# Patient Record
Sex: Male | Born: 1955 | Race: White | Hispanic: No | Marital: Married | Smoking: Never smoker
Health system: Southern US, Community
[De-identification: ages and names within clinical notes are randomized; demographics above are authoritative.]

## PROBLEM LIST (undated history)

## (undated) DIAGNOSIS — K219 Gastro-esophageal reflux disease without esophagitis: Secondary | ICD-10-CM

## (undated) DIAGNOSIS — I10 Essential (primary) hypertension: Secondary | ICD-10-CM

## (undated) DIAGNOSIS — I2510 Atherosclerotic heart disease of native coronary artery without angina pectoris: Secondary | ICD-10-CM

## (undated) DIAGNOSIS — Z86010 Personal history of colonic polyps: Secondary | ICD-10-CM

## (undated) DIAGNOSIS — E785 Hyperlipidemia, unspecified: Secondary | ICD-10-CM

## (undated) HISTORY — PX: TONSILLECTOMY: SUR1361

## (undated) HISTORY — DX: Personal history of colonic polyps: Z86.010

## (undated) HISTORY — DX: Hyperlipidemia, unspecified: E78.5

## (undated) HISTORY — DX: Atherosclerotic heart disease of native coronary artery without angina pectoris: I25.10

## (undated) HISTORY — DX: Essential (primary) hypertension: I10

## (undated) HISTORY — DX: Gastro-esophageal reflux disease without esophagitis: K21.9

## (undated) HISTORY — PX: PROSTATE BIOPSY: SHX241

---
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
## 1998-05-14 HISTORY — PX: LUMBAR DISC SURGERY: SHX700

## 1999-01-15 ENCOUNTER — Emergency Department (HOSPITAL_COMMUNITY): Admission: EM | Admit: 1999-01-15 | Discharge: 1999-01-15 | Payer: Self-pay | Admitting: *Deleted

## 1999-02-06 ENCOUNTER — Ambulatory Visit (HOSPITAL_COMMUNITY): Admission: RE | Admit: 1999-02-06 | Discharge: 1999-02-07 | Payer: Self-pay | Admitting: Neurosurgery

## 2004-06-30 ENCOUNTER — Ambulatory Visit: Payer: Self-pay

## 2005-08-03 ENCOUNTER — Ambulatory Visit: Payer: Self-pay

## 2008-05-14 HISTORY — PX: CORONARY ANGIOPLASTY WITH STENT PLACEMENT: SHX49

## 2008-07-29 ENCOUNTER — Encounter: Payer: Self-pay | Admitting: Cardiovascular Disease

## 2008-10-21 ENCOUNTER — Encounter: Payer: Self-pay | Admitting: Cardiovascular Disease

## 2008-10-21 ENCOUNTER — Inpatient Hospital Stay (HOSPITAL_COMMUNITY): Admission: AD | Admit: 2008-10-21 | Discharge: 2008-10-22 | Payer: Self-pay | Admitting: Cardiology

## 2008-11-09 ENCOUNTER — Encounter: Payer: Self-pay | Admitting: Cardiovascular Disease

## 2008-11-09 LAB — CONVERTED CEMR LAB
ALT: 24 units/L
AST: 24 units/L
Albumin: 4.3 g/dL
Alkaline Phosphatase: 69 units/L
BUN: 21 mg/dL
CO2: 30 meq/L
Calcium: 9.6 mg/dL
Chloride: 101 meq/L
Cholesterol: 134 mg/dL
Creatinine, Ser: 1.2 mg/dL
GFR calc Af Amer: 82 mL/min
GFR calc non Af Amer: 67 mL/min
Glucose, Bld: 89 mg/dL
HDL: 35 mg/dL
LDL Cholesterol: 73 mg/dL
Potassium: 4.9 meq/L
Sodium: 139 meq/L
Total Bilirubin: 0.4 mg/dL
Total Protein: 6.7 g/dL
Triglyceride fasting, serum: 101 mg/dL

## 2009-03-22 ENCOUNTER — Encounter: Payer: Self-pay | Admitting: Cardiovascular Disease

## 2009-04-13 ENCOUNTER — Encounter: Payer: Self-pay | Admitting: Cardiovascular Disease

## 2009-04-14 ENCOUNTER — Encounter: Payer: Self-pay | Admitting: Cardiovascular Disease

## 2009-04-15 ENCOUNTER — Encounter: Payer: Self-pay | Admitting: Cardiovascular Disease

## 2009-05-14 ENCOUNTER — Encounter: Payer: Self-pay | Admitting: Cardiovascular Disease

## 2009-07-21 ENCOUNTER — Encounter: Payer: Self-pay | Admitting: Cardiovascular Disease

## 2009-07-29 ENCOUNTER — Ambulatory Visit: Payer: Self-pay | Admitting: Cardiovascular Disease

## 2009-07-29 DIAGNOSIS — I2510 Atherosclerotic heart disease of native coronary artery without angina pectoris: Secondary | ICD-10-CM | POA: Insufficient documentation

## 2009-07-29 DIAGNOSIS — I10 Essential (primary) hypertension: Secondary | ICD-10-CM | POA: Insufficient documentation

## 2009-07-29 DIAGNOSIS — E782 Mixed hyperlipidemia: Secondary | ICD-10-CM | POA: Insufficient documentation

## 2009-10-14 ENCOUNTER — Encounter: Payer: Self-pay | Admitting: Cardiovascular Disease

## 2010-01-02 IMAGING — CR DG CHEST 2V
2 series · 2 of 2 positions shown · non-contrast
Comparison: None

CLINICAL DATA: Chest pain, post catheterization

CHEST - 2 VIEW

[w chest pa]
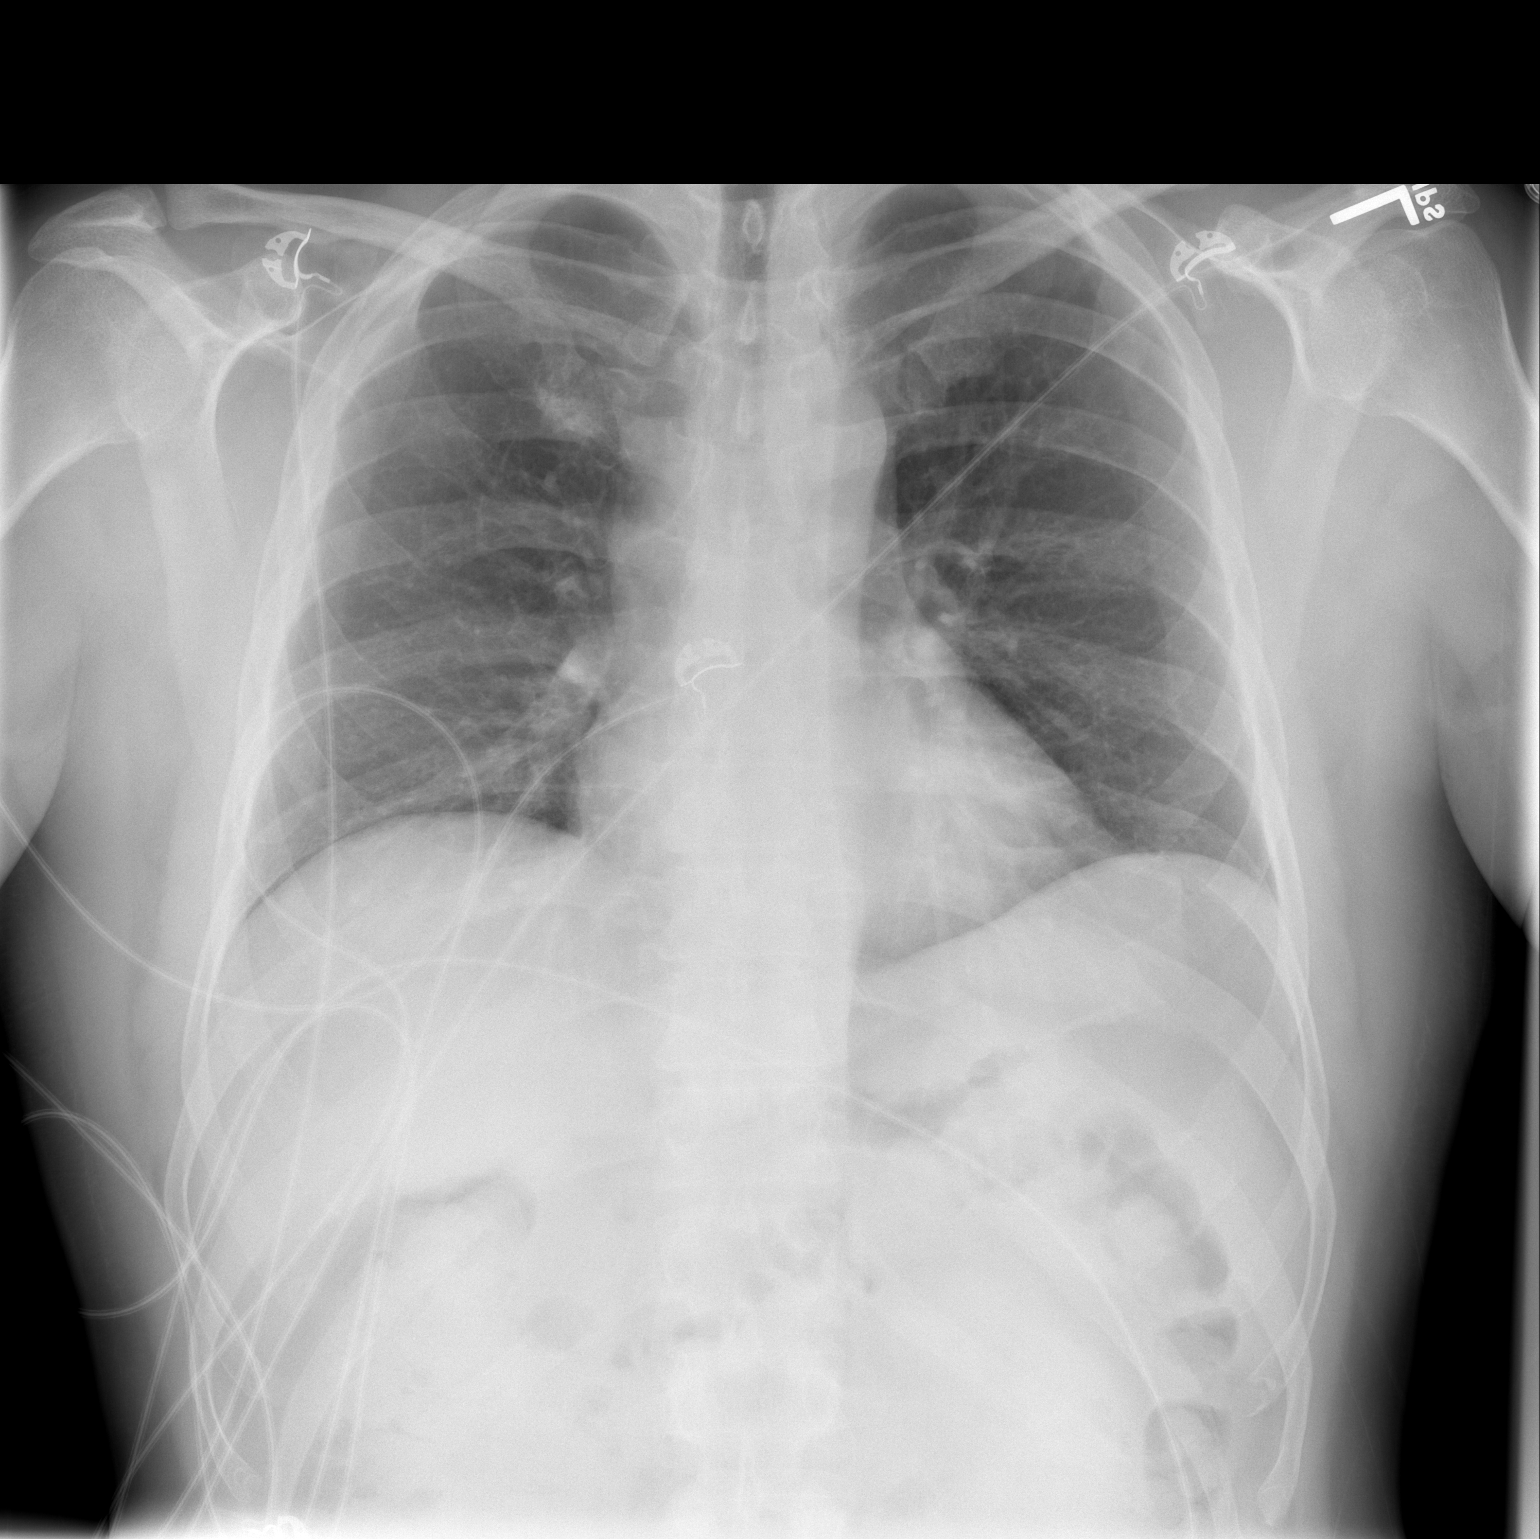

[w chest lat]
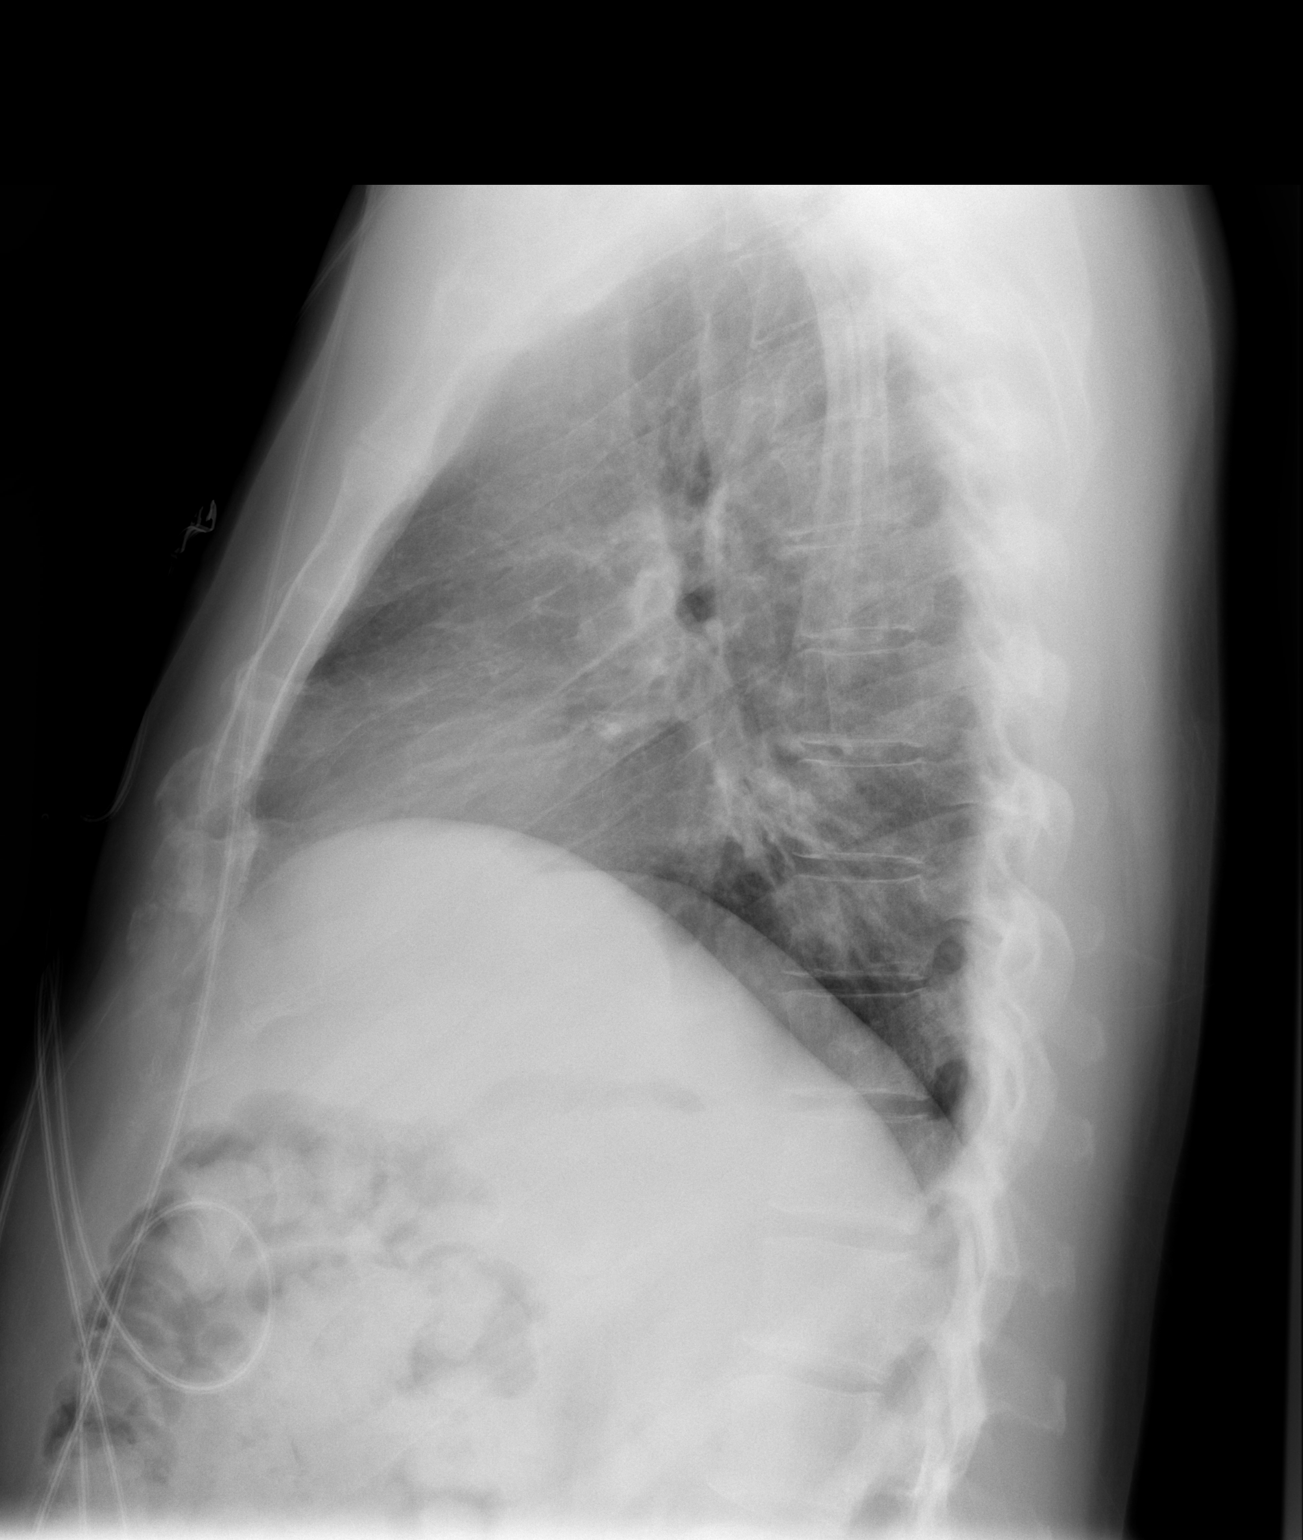

[2 of 2 positions shown; findings below may reference images not displayed]

FINDINGS: Normal heart size, mediastinal contours, and pulmonary vascularity.
Minimal basilar hypoinflation.
Lungs otherwise clear.
Cardiac monitoring lines project over chest.
No pneumothorax or focal bony abnormality.
IMPRESSION: Minimal bibasilar atelectasis.

## 2010-06-11 LAB — CONVERTED CEMR LAB
Cholesterol: 117 mg/dL
HDL: 32 mg/dL
LDL Cholesterol: 65 mg/dL
Triglyceride fasting, serum: 99 mg/dL

## 2010-06-13 NOTE — Assessment & Plan Note (Signed)
Summary: NP6/AMD   Visit Type:  New Patient Referring Provider:  Mariah Milling Primary Provider:  Loma Sender, MD  CC:  no cp, no sob, and no edema.  History of Present Illness: Mr. Degnan is a pleasant 55 year old gentleman with history of coronary artery disease, stent placed 2 proximal to mid LAD for occluded vessel, residual RCA disease estimated at 60% with intervention performed in 2010. He was last seen in clinic at [facility] in mid 2010. Stress test October 21, 2008 showed anterior ischemia which led to his cardiac catheterization.  Overall he feels well. He does have a cough on the lisinopril and would like to change his medication. He does have GERD is well controlled on Protonix. He's otherwise been tolerating his medications with no symptoms. He is becoming more active and plain golf with no symptoms of chest pain, shortness of breath.  Preventive Screening-Counseling & Management  Alcohol-Tobacco     Alcohol drinks/day: <1     Smoking Status: never  Caffeine-Diet-Exercise     Caffeine use/day: 1-2     Does Patient Exercise: no      Drug Use:  no.    Current Problems (verified): 1)  Hyperlipidemia  (ICD-272.4) 2)  Hypertension, Benign  (ICD-401.1) 3)  Cad  (ICD-414.00)  Current Medications (verified): 1)  Pantoprazole Sodium 40 Mg Tbec (Pantoprazole Sodium) .Marland Kitchen.. 1 By Mouth Once Daily 2)  Zyrtec Hives Relief 10 Mg Tabs (Cetirizine Hcl) .Marland Kitchen.. 1 By Mouth Once Daily 3)  Fish Oil 1000 Mg Caps (Omega-3 Fatty Acids) .Marland Kitchen.. 1 By Mouth Once Daily 4)  Aspirin 81 Mg Tbec (Aspirin) .... 2  Tablet By Mouth Daily 5)  Crestor 20 Mg Tabs (Rosuvastatin Calcium) .... Take One Tablet By Mouth Daily. 6)  Carvedilol 3.125 Mg Tabs (Carvedilol) .... Take One Tablet By Mouth Twice A Day 7)  Lisinopril 5 Mg Tabs (Lisinopril) .... Take One Tablet By Mouth Daily 8)  Plavix 75 Mg Tabs (Clopidogrel Bisulfate) .... Take One Tablet By Mouth Daily  Allergies  (verified): No Known Drug Allergies  Past History:  Past Medical History: CAD G E R D Hyperlipidemia Hypertension  Past Surgical History: cath with stents lower back surgery 1999  Family History: Father: deceased 8; heart failure Mother:deceased 66: AAA Brother: living; healthy  Social History: Full Time Married  Tobacco Use - No.  Alcohol Use - yes Regular Exercise - no Drug Use - no Alcohol drinks/day:  <1 Smoking Status:  never Caffeine use/day:  1-2 Does Patient Exercise:  no Drug Use:  no  Review of Systems  The patient denies fatigue, malaise, fever, weight gain/loss, vision loss, decreased hearing, hoarseness, chest pain, palpitations, shortness of breath, prolonged cough, wheezing, sleep apnea, coughing up blood, abdominal pain, blood in stool, nausea, vomiting, diarrhea, heartburn, incontinence, blood in urine, muscle weakness, joint pain, leg swelling, rash, skin lesions, headache, fainting, dizziness, depression, anxiety, enlarged lymph nodes, easy bruising or bleeding, and environmental allergies.         cough on lisinopril  Vital Signs:  Patient profile:   55 year old male Height:      69 inches Weight:      194 pounds BMI:     28.75 Pulse rate:   61 / minute Pulse rhythm:   regular BP sitting:   117 / 81  (left arm) Cuff size:   large  Vitals Entered By: Mercer Pod (July 29, 2009 10:39 AM)  Physical Exam  General:  well-appearing middle-aged gentleman in  no apparent distress, H. EENT exam is benign, neck is supple with no JVP or carotid bruits, heart sounds are regular with S1-S2 and no murmurs appreciated, lungs are clear to auscultation with no wheezes Rales, abdominal exam is benign, no significant lower extremity edema, pulses are equal and symmetrical in his upper and lower extremities, neurologic exam is nonfocal.   Impression & Recommendations:  Problem # 1:  CAD (ICD-414.00) Occlusion of his LAD with stent placed to the  proximal one third LAD, residual 60% RCA disease. No symptoms at this time. Full catheter report from [facility] has not been sent yet and we'll obtain this for our records.  He commented on the price of Plavix and we will change him to effient 10 mg qd with a co-pay card. it should also have less interaction with Protonix which he takes for his GERD.  The following medications were removed from the medication list:    Lisinopril 5 Mg Tabs (Lisinopril) .Marland Kitchen... Take one tablet by mouth daily His updated medication list for this problem includes:    Aspirin 81 Mg Tbec (Aspirin) .Marland Kitchen... 2  tablet by mouth daily    Carvedilol 3.125 Mg Tabs (Carvedilol) .Marland Kitchen... Take one tablet by mouth twice a day    Effient 10 Mg Tabs (Prasugrel hcl) .Marland Kitchen... Take one tablet by mouth daily  Problem # 2:  HYPERLIPIDEMIA (ICD-272.4) Cholesterol was reasonably well controlled on his last lab in June who he states having a new set of labs in December for [employer]. We'll try to obtain these for our records.  His updated medication list for this problem includes:    Crestor 40 Mg Tabs (Rosuvastatin calcium) .Marland Kitchen... Take one tablet by mouth daily.  Problem # 3:  HYPERTENSION, BENIGN (ICD-401.1) He has a cough with lisinopril and we will change him to losartan 25 mg a day. If his pressure is not well controlled, we could increase to 50 mg a day  The following medications were removed from the medication list:    Lisinopril 5 Mg Tabs (Lisinopril) .Marland Kitchen... Take one tablet by mouth daily His updated medication list for this problem includes:    Aspirin 81 Mg Tbec (Aspirin) .Marland Kitchen... 2  tablet by mouth daily    Carvedilol 3.125 Mg Tabs (Carvedilol) .Marland Kitchen... Take one tablet by mouth twice a day    Losartan Potassium 50 Mg Tabs (Losartan potassium) .Marland Kitchen... 1 tab by mouth daily  Patient Instructions: 1)  Your physician recommends that you schedule a follow-up appointment in: 1 year 2)  Your physician has  recommended you make the following change in your medication: stop plaviz, stop lisinopril, start effient 10 mg daily, start losartan 50 mg daily Prescriptions: LOSARTAN POTASSIUM 50 MG TABS (LOSARTAN POTASSIUM) 1 tab by mouth daily  #30 x 6   Entered by:   Charlena Cross, RN, BSN   Authorized by:   Dossie Arbour MD   Signed by:   Charlena Cross, RN, BSN on 07/29/2009   Method used:   Electronically to        [id number] (retail)       [address]       [place], [place]  [address]       Ph: [phone]       Fax: [phone]   RxID:   [id number] CRESTOR 40 MG TABS (ROSUVASTATIN CALCIUM) Take one tablet by mouth daily.  #30 x 3   Entered by:   Charlena Cross, RN,  BSN   Authorized by:   Dossie Arbour MD   Signed by:   Charlena Cross, RN, BSN on 07/29/2009   Method used:   Electronically to        [id number] (retail)       [address]       [place], [place]  [address]       Ph: [phone]       Fax: [phone]   RxID:   [id number] EFFIENT 10 MG TABS (PRASUGREL HCL) Take one tablet by mouth daily  #30 x 6   Entered by:   Charlena Cross, RN, BSN   Authorized by:   Dossie Arbour MD   Signed by:   Charlena Cross, RN, BSN on 07/29/2009   Method used:   Electronically to        [id number] (retail)       [address]       [place], [place]  [address]       Ph: [phone]       Fax: [phone]   RxID:   [id number]

## 2010-06-13 NOTE — Progress Notes (Signed)
Summary: PHI  PHI   Imported By: Harlon Flor 08/02/2009 11:34:16  _____________________________________________________________________  External Attachment:    Type:   Image     Comment:   External Document

## 2010-06-13 NOTE — Letter (Signed)
Summary: Medical Record Release  Medical Record Release   Imported By: Harlon Flor 08/05/2009 08:39:26  _____________________________________________________________________  External Attachment:    Type:   Image     Comment:   External Document

## 2010-06-13 NOTE — Miscellaneous (Signed)
Summary: labs 04/15/2009  Clinical Lists Changes  Observations: Added new observation of TRIGLYCERIDE: 99 mg/dL (04/54/0981 19:14) Added new observation of HDL: 32 mg/dL (78/29/5621 30:86) Added new observation of LDL: 65 mg/dL (57/84/6962 95:28) Added new observation of CHOLESTEROL: 117 mg/dL (41/32/4401 02:72)      -  Date:  04/14/2009    Cholesterol: 117    LDL: 65    HDL: 32    Triglycerides: 99

## 2010-08-21 LAB — CARDIAC PANEL(CRET KIN+CKTOT+MB+TROPI)
CK, MB: 4.5 ng/mL — ABNORMAL HIGH (ref 0.3–4.0)
Relative Index: 4.3 — ABNORMAL HIGH (ref 0.0–2.5)
Total CK: 100 U/L (ref 7–232)
Troponin I: 0.07 ng/mL — ABNORMAL HIGH (ref 0.00–0.06)

## 2010-08-21 LAB — BASIC METABOLIC PANEL
BUN: 12 mg/dL (ref 6–23)
Chloride: 107 mEq/L (ref 96–112)
Creatinine, Ser: 1.16 mg/dL (ref 0.4–1.5)
GFR calc Af Amer: >60 mL/min (ref >60)
GFR calc non Af Amer: >60 mL/min (ref >60)
Potassium: 4.5 mEq/L (ref 3.5–5.1)

## 2010-08-21 LAB — POCT I-STAT, CHEM 8
BUN: 16 mg/dL (ref 6–23)
Calcium, Ion: 1.22 mmol/L (ref 1.12–1.32)
TCO2: 27 mmol/L (ref 0–100)

## 2010-08-21 LAB — COMPREHENSIVE METABOLIC PANEL
AST: 26 U/L (ref 0–37)
CO2: 29 mEq/L (ref 19–32)
Calcium: 9.5 mg/dL (ref 8.4–10.5)
Chloride: 103 mEq/L (ref 96–112)
Creatinine, Ser: 1.14 mg/dL (ref 0.4–1.5)
GFR calc Af Amer: >60 mL/min (ref >60)
GFR calc non Af Amer: >60 mL/min (ref >60)
Glucose, Bld: 95 mg/dL (ref 70–99)
Total Bilirubin: 0.8 mg/dL (ref 0.3–1.2)

## 2010-08-21 LAB — CBC
MCV: 86.8 fL (ref 78.0–100.0)
Platelets: 196 10*3/uL (ref 150–400)
Platelets: 206 10*3/uL (ref 150–400)
RBC: 5.14 MIL/uL (ref 4.22–5.81)
WBC: 7.5 10*3/uL (ref 4.0–10.5)
WBC: 8.6 10*3/uL (ref 4.0–10.5)

## 2010-08-21 LAB — PROTIME-INR: Prothrombin Time: 13.5 seconds (ref 11.6–15.2)

## 2010-09-26 NOTE — Discharge Summary (Signed)
Alec Smith, Alec Smith               ACCOUNT NO.:  [id number]   MEDICAL RECORD NO.:  [id number]          PATIENT TYPE:  INP   LOCATION:  2503                         FACILITY:  [facility]   PHYSICIAN:  Cristy Hilts. Jacinto Halim, MD       DATE OF BIRTH:  July 31, 1955   DATE OF ADMISSION:  10/21/2008  DATE OF DISCHARGE:  10/22/2008                               DISCHARGE SUMMARY   REFERRING PHYSICIAN:  Dr. Loma Sender.   DISCHARGE DIAGNOSES:  1. Acute coronary syndrome.  2. Coronary artery disease with percutaneous transluminal coronary      angioplasty and stent x5 in the left anterior descending, reducing      100% stenosis to 0% with normal left ventricular function.      a.     Residual 60% right coronary artery disease.      b.     Normal renal arteries.   DISCHARGE CONDITION:  Improved and stable.   PROCEDURES:  1. Combined left heart cath on October 21, 2008, by Dr. Nanetta Batty.  2. On October 21, 2008, percutaneous transluminal coronary angioplasty      and stent deployment x5 with drug-eluting Taxus stent to the left      anterior descending.   DISCHARGE MEDICATIONS:  1. Protonix 40 mg daily at lunch.  2. Zyrtec 1 tab daily.  3. Fish oil 2000 mg daily.  4. Increase aspirin to 325 mg enteric coated daily.  5. Coreg 3.125 mg 1 twice a day for heart and blood pressure.  6. Lisinopril 5 mg 1 daily for heart and blood pressure.  7. Plavix 75 mg 1 daily, do not stop, stopping could cause heart      attack.  8. Increase simvastatin to 40 mg daily.  9. Nitroglycerin 1/150 one under you tongue while sitting for chest      pain.  Take 1 every 5 minutes up to 3/15 minutes.  If no relief,      call 911.   DISCHARGE INSTRUCTIONS:  1. May return to work on October 25, 2008.  2. Low-sodium heart-healthy diet.  3. Wash cath site with soap and water.  Call if any bleeding,      swelling, or drainage.  4. Increase activity slowly.  May shower.  No lifting for 1 week.  No      driving for 3 days.  5.  Follow up with Dr. Mariah Milling on November 03, 2008, at 9:45 a.m.  6. Have blood work done in 2 weeks to check cholesterol.   HISTORY OF PRESENT ILLNESS:  A 55 year old white male had been referred  to Dr. Mariah Milling by Dr. Loma Sender for episodes of chest pain and  shortness of breath.  These episodes more frequently associated with  exertion.  He was seen in the office on May 19 and because of these  symptoms and family history of heart failure with his father at age 38,  he was scheduled for a stress Myoview.  The patient underwent this on  October 21, 2008.  During the procedure, he had chest pain and ST  depression  1-2 mm inferior lateral leads and frequent PVCs.  His nuclear  stress revealed anterior ischemia.  For his chest pain, he received  sublingual nitroglycerin, and the pain resolved.  He was seen by Dr.  Jacinto Halim in the office and transferred to [facility] for heart  catheterization.  Cardiac cath revealed 90% proximal LAD stenosis as  well as a 100% mid proximal stenosis, undergoing PTCA and stent  deployment with 5 Taxus stents, reducing stenosis to 0.  He also has  residual 60% RCA stenosis that will be managed medically.   By October 22, 2008, the patient had no further complaints.  He felt well.  He ambulated with cardiac rehab.  Education was given for cardiac  issues.  We also repeated CK-MB and troponin minimally elevated  secondary to initially acute coronary syndrome and second troponin  slightly elevated at 0.07, felt to be due to long PTCA and stenting.   LABORATORY DATA:  Hemoglobin at discharge 15.1, hematocrit 44.5, WBC  7.5, platelets 196, and MCV 86.7.  Chemistry:  Sodium 140, potassium  4.5, chloride 107, CO2 of 29, BUN 12, creatinine 1.16, and glucose 97.  Protime 13.5.  INR of 1.   AST was 26, ALT 33, and alkaline phos 66.  Total bili 0.8.  Albumin 4.0.   Cardiac enzymes; initial troponin 0.06 and 16 hours later, troponin I  0.07.  CK initially was 100 with 5.8 MBs  and followup CK was 104 with  4.5 MBs, which actually was lower.   Calcium 8.7 and TSH was 1.42.   PA and lateral chest x-ray on June 11, minimal bibasilar atelectasis.  EKG, in 6 minutes 6 of recovery, inferior depression in II, III, aVF,  and minimal depression in V5 and 6.  Post stenting, the patient had  inverted T waves in V2, V3, V4, and V5, and his ST depression in II,  III, and aVF were resolved, and followup EKG on the morning of October 22, 2008, again with ST depression in V2, V3, V4, and V5, and no ST  depression in II, III, and aVF.  He also did have a first-degree block,  PR was 236 msec.   At the time of discharge, the patient was stable without complaints and  ready for discharge to home.  As stated, he will follow up with Dr.  Mariah Milling.      Darcella Gasman. [place], N.P.      Cristy Hilts. Jacinto Halim, MD  Electronically Signed    LRI/MEDQ  D:  10/22/2008  T:  10/23/2008  Job:  [id number]   cc:   Nanetta Batty, M.D.  Antonieta Iba, MD  Loma Sender

## 2010-09-26 NOTE — Cardiovascular Report (Signed)
NAMEMarland Kitchen  ZYION, DOXTATER NO.:  [id number]   MEDICAL RECORD NO.:  [id number]          PATIENT TYPE:  INP   LOCATION:  2503                         FACILITY:  [facility]   PHYSICIAN:  Nanetta Batty, M.D.   DATE OF BIRTH:  [birth year]   DATE OF PROCEDURE:  10/21/2008  DATE OF DISCHARGE:                            CARDIAC CATHETERIZATION   Alec Smith is a 55 year old married white male, father of 2, patient  of Dr. Windell Hummingbird with multiple cardiac risk factors who has had 46 weeks  of exertional angina.  He saw Dr. Mariah Milling initially.  He set him for a  Myoview stress test, which was done today in the office.  He exercised  and had chest pain, ST-segment depression, and anterior ischemia.  He  was seen by Dr. Jacinto Halim and admitted to the hospital for angiography and  potential intervention.   DESCRIPTION OF PROCEDURE:  The patient was brought to the Second Floor  [facility] Cath Lab in a postabsorptive state.  He was  premedicated with p.o. Valium, IV Versed, and fentanyl.  His right groin  was prepped and shaved in the usual sterile fashion.  Xylocaine 1% was  used for local anesthesia.  A 6-French sheath was inserted to the right  femoral artery using standard Seldinger technique.  A 6-French sheath  was inserted into the right femoral vein for venous access because of  inability to place a peripheral line.  A 6-French right and left Judkins  diagnostic catheters as well as a 6-French pigtail catheter were used  for selective coronary angiography, left ventriculography, subselective  left internal mammary artery angiography, and distal abdominal  aortography.  Visipaque dye was used for the entirety of the case.  Retrograde aorta, left ventricular, and pullback pressures were  recorded.   HEMODYNAMIC RESULTS:  1. Aortic systolic pressure 137, diastolic pressure 84.  2. Left ventricular systolic pressure 134, end-diastolic pressure 14.   SELECTIVE CORONARY  ANGIOGRAPHY:  1. Left main normal.  2. LAD; the LAD was totally occluded after the first septal perforator      and first small diagonal branch with some collaterals in the apical      LAD.  3. Circumflex; large dominant vessel and free of significant disease.  4. Right coronary artery; nondominant vessel with a 60% mid stenosis.  5. Left ventriculography; RAO left ventriculogram was performed using      25 mL of Visipaque dye at 12 mL per second.  The overall LVEF was      estimated at greater than 60% without focal wall motion      abnormalities.  6. Left internal mammary artery; this vessel was subselectively      visualized and was widely patent.  It was suitable for use during      coronary artery bypass grafting if necessary.  7. Distal abdominal aortography; distal abdominal aortogram was      performed using 20 mL of Visipaque dye at 20 mL per second.  Renal      arteries were widely patent.  Infrarenal abdominal aorta and iliac  bifurcation were free of significant atherosclerotic changes.   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  We will proceed with  attempted recanalization percutaneously.   DESCRIPTION OF PROCEDURE:  The patient was on chronic aspirin, received  600 mg of p.o. Plavix and Angiomax bolus infusion with an ACT of 348.  Using a 7-French FL-3.0 guide catheter along with an 0.014 x 190  Prowater, choice PT2 and ultimately a Fielder XT wire were used.  A 2.0  x 12 apex was used initially for backup unsuccessfully.  This was  followed by a FineCross catheter which was partially successful in  advancing the wire approximately two-thirds to three-quarters the way  down the occluded segment.  Following this, a 1.5 x 10 apex crosswire  was used to ultimately gain access into the distal vessel.  This was  used to then dilate the chronically occluded segment.  A 2.0 x 30 apex  was then used to  further dilate the segment from the point of distal  occlusion up to the point of proximal occlusion.  Overlapping 2.25 x 32  Taxus Adam drug-eluting stents were then deployed beginning distally and  then working proximally.  This was followed by 2.25 x 16 Taxus Adam  deployed at 16 and 18 atmospheres.  A 3.0 and 12 Taxus was then deployed  in the proximal LAD  that had 90% stenosis and the overlapping segment  was stented with a 2.75 x 8 Taxus.  The entire 2.25 segment was then  postdilated with a 2.5 x 25 [place] Voyager up to 16 and 18 atmospheres, the  more proximal segment was postdilated with a 2.75 x a 15 [place] Voyager and  the most proximal segment where the 3.0 stent was deployed was  postdilated with a 3.25 x 12 [place] Voyager.   FINAL RESULT:  Reduction of total occlusion to 0% with residual TIMI III  flow.  The patient tolerated the procedure well.  There are hemodynamic  or electrocardiographic sequelae.  The guidewire and catheter were  removed.  The sheath was sewn securely in place.  The patient left the  lab in stable condition.  He will be treated aspirin, Plavix, beta-  blocker, statin drug, and ACE inhibitor.      Nanetta Batty, M.D.  Electronically Signed     JB/MEDQ  D:  10/21/2008  T:  10/22/2008  Job:  [id number]   cc:   Second Floor [place] [facility]  [facility]  [place] R. Jacinto Halim, MD  Antonieta Iba, MD  Loma Sender

## 2010-10-05 ENCOUNTER — Other Ambulatory Visit: Payer: Self-pay

## 2010-10-05 MED ORDER — LOSARTAN POTASSIUM 50 MG PO TABS: 50.0000 mg | ORAL_TABLET | Freq: Every day | ORAL | Status: DC

## 2010-10-16 ENCOUNTER — Telehealth: Payer: Self-pay

## 2010-10-16 MED ORDER — PRASUGREL HCL 10 MG PO TABS: 10.0000 mg | ORAL_TABLET | Freq: Every day | ORAL | Status: DC

## 2010-10-16 NOTE — Telephone Encounter (Signed)
Refill request for effient 10 mg one tablet daily with 30 tablets and 6 refills.

## 2010-10-19 ENCOUNTER — Encounter: Payer: Self-pay | Admitting: Cardiovascular Disease

## 2010-10-23 ENCOUNTER — Ambulatory Visit: Payer: Self-pay | Admitting: Cardiovascular Disease

## 2010-10-25 ENCOUNTER — Ambulatory Visit (INDEPENDENT_AMBULATORY_CARE_PROVIDER_SITE_OTHER): Payer: BC Managed Care – PPO | Admitting: Cardiovascular Disease

## 2010-10-25 ENCOUNTER — Encounter: Payer: Self-pay | Admitting: Cardiovascular Disease

## 2010-10-25 VITALS — BP 135/87 | HR 60 | Ht 69.0 in | Wt 184.0 lb

## 2010-10-25 DIAGNOSIS — I10 Essential (primary) hypertension: Secondary | ICD-10-CM

## 2010-10-25 DIAGNOSIS — I2510 Atherosclerotic heart disease of native coronary artery without angina pectoris: Secondary | ICD-10-CM

## 2010-10-25 DIAGNOSIS — E785 Hyperlipidemia, unspecified: Secondary | ICD-10-CM

## 2010-10-25 MED ORDER — TADALAFIL 5 MG PO TABS: 5.0000 mg | ORAL_TABLET | Freq: Every day | ORAL | Status: DC | PRN

## 2010-10-25 NOTE — Progress Notes (Signed)
   Patient ID: Alec Smith, male    DOB: 06-03-1955, 55 y.o.   MRN: [id number]  HPI Comments: Alec Smith is a pleasant 55 year old gentleman with history of coronary artery disease, stent placed to the proximal to mid LAD for occluded vessel, residual RCA disease estimated at 60% with intervention performed in 2010. Last stress test Stress test October 21, 2008 showed anterior ischemia which led to his cardiac catheterization.   Overall he feels well.  He's otherwise been tolerating his medications with no symptoms. He is becoming more active and plain golf with no symptoms of chest pain, shortness of breath. He goes to the gym and exercises 4 times per week, lifts weights. He reports that his weight has been decreasing. Overall he feels well has no complaints.  EKG shows normal sinus rhythm with rate 60 beats per minute, no significant ST or T wave changes     Review of Systems  Constitutional: Negative.   HENT: Negative.   Eyes: Negative.   Respiratory: Negative.   Cardiovascular: Negative.   Gastrointestinal: Negative.   Musculoskeletal: Negative.   Skin: Negative.   Neurological: Negative.   Hematological: Negative.   Psychiatric/Behavioral: Negative.   All other systems reviewed and are negative.    BP 135/87  Pulse 60  Ht 5\' 9"  (1.753 m)  Wt 184 lb (83.462 kg)  BMI 27.17 kg/m2  Physical Exam  Nursing note and vitals reviewed. Constitutional: He is oriented to person, place, and time. He appears well-developed and well-nourished.  HENT:  Head: Normocephalic.  Nose: Nose normal.  Mouth/Throat: Oropharynx is clear and moist.  Eyes: Conjunctivae are normal. Pupils are equal, round, and reactive to light.  Neck: Normal range of motion. Neck supple. No JVD present.  Cardiovascular: Normal rate, regular rhythm, S1 normal, S2 normal, normal heart sounds and intact distal pulses.  Exam reveals no gallop and no friction rub.   No murmur heard. Pulmonary/Chest: Effort normal  and breath sounds normal. No respiratory distress. He has no wheezes. He has no rales. He exhibits no tenderness.  Abdominal: Soft. Bowel sounds are normal. He exhibits no distension. There is no tenderness.  Musculoskeletal: Normal range of motion. He exhibits no edema and no tenderness.  Lymphadenopathy:    He has no cervical adenopathy.  Neurological: He is alert and oriented to person, place, and time. Coordination normal.  Skin: Skin is warm and dry. No rash noted. No erythema.  Psychiatric: He has a normal mood and affect. His behavior is normal. Judgment and thought content normal.           Assessment and Plan

## 2010-10-25 NOTE — Assessment & Plan Note (Signed)
Blood pressure is well controlled on today's visit. No changes made to the medications. 

## 2010-10-25 NOTE — Patient Instructions (Addendum)
You are doing well. No medication changes were made. Please call us if you have new issues that need to be addressed before your next appt.  We will call you for a follow up Appt. In 12 months  

## 2010-10-25 NOTE — Assessment & Plan Note (Addendum)
Currently with no symptoms of angina. No further workup at this time. Continue current medication regimen. We have given him a coupon for effient. If it continues to be expensive, we could change to Plavix 75 mg daily with aspirin 81 mg x2.

## 2010-10-25 NOTE — Assessment & Plan Note (Signed)
Cholesterol is at goal on the current lipid regimen. No changes to the medications were made.We will try to obtain his most recent numbers from Dr. Vear Clock records.

## 2010-10-31 ENCOUNTER — Telehealth: Payer: Self-pay | Admitting: *Deleted

## 2010-10-31 NOTE — Telephone Encounter (Signed)
Pt called this AM with lipid results, he was told to call and give to Dr. Mariah Milling prior to being sent from office and scanned: TC 119, LDL 58, HDL 34 (was told to call if <40) TG 133. Testosterone level was 284.

## 2010-11-06 NOTE — Telephone Encounter (Signed)
Looks fantastic

## 2010-11-07 NOTE — Telephone Encounter (Signed)
Attempted to contact pt, LMOM TCB.  

## 2010-11-08 NOTE — Telephone Encounter (Signed)
Pt.notified

## 2010-12-07 ENCOUNTER — Telehealth: Payer: Self-pay

## 2010-12-07 MED ORDER — ROSUVASTATIN CALCIUM 40 MG PO TABS: 40.0000 mg | ORAL_TABLET | Freq: Every day | ORAL | Status: DC

## 2010-12-07 NOTE — Telephone Encounter (Signed)
Refill for crestor 40 mg take one tablet daily.

## 2011-02-23 ENCOUNTER — Other Ambulatory Visit: Payer: Self-pay | Admitting: *Deleted

## 2011-02-23 MED ORDER — CARVEDILOL 3.125 MG PO TABS: 3.1250 mg | ORAL_TABLET | Freq: Two times a day (BID) | ORAL | Status: DC

## 2011-05-14 ENCOUNTER — Other Ambulatory Visit: Payer: Self-pay

## 2011-05-14 MED ORDER — LOSARTAN POTASSIUM 50 MG PO TABS: 50.0000 mg | ORAL_TABLET | Freq: Every day | ORAL | Status: DC

## 2011-05-14 MED ORDER — PRASUGREL HCL 10 MG PO TABS: 10.0000 mg | ORAL_TABLET | Freq: Every day | ORAL | Status: DC

## 2011-06-18 ENCOUNTER — Telehealth: Payer: Self-pay | Admitting: Cardiovascular Disease

## 2011-06-18 NOTE — Telephone Encounter (Signed)
Pt wife calling states that Dr Mariah Milling referred him to Dr Achilles Dunk and they cant see him until June 26th. Wants to know if theres is another MD or if Dr Mariah Milling could get him in sooner.

## 2011-06-18 NOTE — Telephone Encounter (Signed)
Would you recommend anybody other than Dr. Achilles Dunk.

## 2011-06-18 NOTE — Telephone Encounter (Signed)
stoioff Same office Maybe [facility]? Don't know

## 2011-06-19 NOTE — Telephone Encounter (Signed)
Was able to move his appointment up to see DR. Stoioff at [facility].  Appointment is scheduled for April 15th at 9:30am with Dr. Irineo Axon.  Called all contact numbers and home number is disconnected and work phone states he does not work at the number.  Will try to contact pharmacy to get an updated phone number for patient.

## 2011-10-30 ENCOUNTER — Encounter: Payer: Self-pay | Admitting: Cardiovascular Disease

## 2011-10-30 ENCOUNTER — Ambulatory Visit (INDEPENDENT_AMBULATORY_CARE_PROVIDER_SITE_OTHER): Payer: BC Managed Care – PPO | Admitting: Cardiovascular Disease

## 2011-10-30 VITALS — BP 120/80 | HR 70 | Ht 69.0 in | Wt 200.0 lb

## 2011-10-30 DIAGNOSIS — E785 Hyperlipidemia, unspecified: Secondary | ICD-10-CM

## 2011-10-30 DIAGNOSIS — I2510 Atherosclerotic heart disease of native coronary artery without angina pectoris: Secondary | ICD-10-CM

## 2011-10-30 DIAGNOSIS — I10 Essential (primary) hypertension: Secondary | ICD-10-CM

## 2011-10-30 MED ORDER — ATORVASTATIN CALCIUM 80 MG PO TABS: 80.0000 mg | ORAL_TABLET | Freq: Every day | ORAL | Status: DC

## 2011-10-30 MED ORDER — CLOPIDOGREL BISULFATE 75 MG PO TABS: 75.0000 mg | ORAL_TABLET | Freq: Every day | ORAL | Status: DC

## 2011-10-30 MED ORDER — TADALAFIL 5 MG PO TABS: 5.0000 mg | ORAL_TABLET | Freq: Every day | ORAL | Status: DC | PRN

## 2011-10-30 NOTE — Assessment & Plan Note (Signed)
Blood pressure is well controlled on today's visit. No changes made to the medications. 

## 2011-10-30 NOTE — Progress Notes (Signed)
Patient ID: Alec Smith, male    DOB: [birth year], 56 y.o.   MRN: [id number]  HPI Comments: Mr. Goodrich is a pleasant 56 year old gentleman with history of coronary artery disease, stent placed to the proximal to mid LAD for occluded vessel, residual RCA disease estimated at 60% with intervention performed in 2010. Last stress test Stress test October 21, 2008 showed anterior ischemia which led to his cardiac catheterization.   Overall he feels well.  He's otherwise been tolerating his medications with no symptoms.  no symptoms of chest pain, shortness of breath. He goes to the gym and exercises 4 times per week, lifts weights. Weight has been increasing recently . Overall he feels well has no complaints.  EKG shows normal sinus rhythm with rate 70 beats per minute, no significant ST or T wave changes   Outpatient Encounter Prescriptions as of 10/30/2011  Medication Sig Dispense Refill  . aspirin 81 MG EC tablet Take 162 mg by mouth daily.       . carvedilol (COREG) 3.125 MG tablet Take 1 tablet (3.125 mg total) by mouth 2 (two) times daily with a meal.  60 tablet  12  . cetirizine (ZYRTEC) 10 MG tablet Take 10 mg by mouth daily.        Marland Kitchen KRILL OIL PO Take by mouth.        . losartan (COZAAR) 50 MG tablet Take 1 tablet (50 mg total) by mouth daily.  30 tablet  7  . pantoprazole (PROTONIX) 40 MG tablet Take 40 mg by mouth daily.        Marland Kitchen testosterone (ANDROGEL) 50 MG/5GM GEL Place 5 g onto the skin daily.      .  prasugrel (EFFIENT) 10 MG TABS Take 1 tablet (10 mg total) by mouth daily.  30 tablet  6  .  rosuvastatin (CRESTOR) 40 MG tablet Take 1 tablet (40 mg total) by mouth daily.  30 tablet  6     Review of Systems  Constitutional: Negative.   HENT: Negative.   Eyes: Negative.   Respiratory: Negative.   Cardiovascular: Negative.   Gastrointestinal: Negative.   Musculoskeletal: Negative.   Skin: Negative.   Neurological: Negative.   Hematological: Negative.     Psychiatric/Behavioral: Negative.   All other systems reviewed and are negative.   BP 120/80  Pulse 70  Ht 5\' 9"  (1.753 m)  Wt 200 lb (90.719 kg)  BMI 29.53 kg/m2  Physical Exam  Nursing note and vitals reviewed. Constitutional: He is oriented to person, place, and time. He appears well-developed and well-nourished.  HENT:  Head: Normocephalic.  Nose: Nose normal.  Mouth/Throat: Oropharynx is clear and moist.  Eyes: Conjunctivae are normal. Pupils are equal, round, and reactive to light.  Neck: Normal range of motion. Neck supple. No JVD present.  Cardiovascular: Normal rate, regular rhythm, S1 normal, S2 normal, normal heart sounds and intact distal pulses.  Exam reveals no gallop and no friction rub.   No murmur heard. Pulmonary/Chest: Effort normal and breath sounds normal. No respiratory distress. He has no wheezes. He has no rales. He exhibits no tenderness.  Abdominal: Soft. Bowel sounds are normal. He exhibits no distension. There is no tenderness.  Musculoskeletal: Normal range of motion. He exhibits no edema and no tenderness.  Lymphadenopathy:    He has no cervical adenopathy.  Neurological: He is alert and oriented to person, place, and time. Coordination normal.  Skin: Skin is warm and dry. No rash noted. No erythema.  Psychiatric: He has a normal mood and affect. His behavior is normal. Judgment and thought content normal.           Assessment and Plan

## 2011-10-30 NOTE — Assessment & Plan Note (Addendum)
Currently with no symptoms of angina. No further workup at this time. Continue current medication regimen. We'll change his effient to Plavix 75 mg daily.

## 2011-10-30 NOTE — Patient Instructions (Addendum)
You are doing well. Please change effient to plavix one a day Change crestor to lipitor one a day  Please call us if you have new issues that need to be addressed before your next appt.  Your physician wants you to follow-up in: 12 months.  You will receive a reminder letter in the mail two months in advance. If you don't receive a letter, please call our office to schedule the follow-up appointment.

## 2011-10-30 NOTE — Assessment & Plan Note (Signed)
He is scheduled to have his lab work checked next week. Will try to obtain these labs prior when they are available. Cholesterol 2 years ago was at goal. Crestor is expensive for him and after his lab work done, if appropriate, we could start Lipitor 40 mg daily.

## 2011-12-26 ENCOUNTER — Other Ambulatory Visit: Payer: Self-pay | Admitting: *Deleted

## 2011-12-26 MED ORDER — LOSARTAN POTASSIUM 50 MG PO TABS: 50.0000 mg | ORAL_TABLET | Freq: Every day | ORAL | Status: DC

## 2011-12-26 NOTE — Telephone Encounter (Signed)
Refilled Losartan

## 2012-04-02 ENCOUNTER — Other Ambulatory Visit: Payer: Self-pay | Admitting: Cardiovascular Disease

## 2012-04-02 ENCOUNTER — Other Ambulatory Visit: Payer: Self-pay | Admitting: *Deleted

## 2012-04-02 MED ORDER — CARVEDILOL 3.125 MG PO TABS: 3.1250 mg | ORAL_TABLET | Freq: Two times a day (BID) | ORAL | Status: DC

## 2012-04-02 NOTE — Telephone Encounter (Signed)
Refilled Carvedilol. 

## 2012-04-07 ENCOUNTER — Encounter: Payer: Self-pay | Admitting: Cardiovascular Disease

## 2012-04-07 ENCOUNTER — Ambulatory Visit (INDEPENDENT_AMBULATORY_CARE_PROVIDER_SITE_OTHER): Payer: BC Managed Care – PPO | Admitting: Cardiovascular Disease

## 2012-04-07 VITALS — BP 132/90 | HR 73 | Ht 69.0 in | Wt 194.2 lb

## 2012-04-07 DIAGNOSIS — I2510 Atherosclerotic heart disease of native coronary artery without angina pectoris: Secondary | ICD-10-CM

## 2012-04-07 DIAGNOSIS — R209 Unspecified disturbances of skin sensation: Secondary | ICD-10-CM

## 2012-04-07 DIAGNOSIS — R208 Other disturbances of skin sensation: Secondary | ICD-10-CM | POA: Insufficient documentation

## 2012-04-07 DIAGNOSIS — E785 Hyperlipidemia, unspecified: Secondary | ICD-10-CM

## 2012-04-07 DIAGNOSIS — I10 Essential (primary) hypertension: Secondary | ICD-10-CM

## 2012-04-07 NOTE — Progress Notes (Signed)
Patient ID: Alec Smith, male    DOB: 03/26/1956, 56 y.o.   MRN: [id number]  HPI Comments: Mr. Arpino is a pleasant 56 year old gentleman with history of coronary artery disease, stent placed to the proximal to mid LAD for occluded vessel, residual RCA disease estimated at 60% with intervention performed in 2010. Last stress test Stress test October 21, 2008 showed anterior ischemia which led to his cardiac catheterization.   Overall he feels well.  He's otherwise been tolerating his medications.  no symptoms of chest pain, shortness of breath. He goes to the gym and exercises 4 times per week, lifts weights.   He does report having one month or more of facial flushing that comes on in the afternoon. More recently, it has started happening earlier in the day. Occasional headaches. He continues to walk long distances for work up to 6 or more miles per day. He does take multivitamin in the morning. No other herbs or over-the-counter supplements. No recent blood work.  EKG shows normal sinus rhythm with rate 73 beats per minute, no significant ST or T wave changes   Outpatient Encounter Prescriptions as of 04/07/2012  Medication Sig Dispense Refill  . aspirin 325 MG tablet Take 175 mg by mouth daily.      Marland Kitchen atorvastatin (LIPITOR) 40 MG tablet Take 40 mg by mouth daily.      . carvedilol (COREG) 3.125 MG tablet Take 1 tablet (3.125 mg total) by mouth 2 (two) times daily with a meal.  60 tablet  6  . cetirizine (ZYRTEC) 10 MG tablet Take 10 mg by mouth daily.        . clopidogrel (PLAVIX) 75 MG tablet Take 1 tablet (75 mg total) by mouth daily.  90 tablet  3  . KRILL OIL PO Take 300 mg by mouth daily.       Marland Kitchen losartan (COZAAR) 50 MG tablet Take 1 tablet (50 mg total) by mouth daily.  30 tablet  7  . pantoprazole (PROTONIX) 40 MG tablet Take 40 mg by mouth daily.        . tadalafil (CIALIS) 5 MG tablet Take 1 tablet (5 mg total) by mouth daily as needed for erectile dysfunction.  30 tablet  6  .  testosterone (ANDROGEL) 50 MG/5GM GEL Place 5 g onto the skin daily.         Review of Systems  Constitutional: Negative.        Facial flushing episodes  HENT: Negative.   Eyes: Negative.   Respiratory: Negative.   Cardiovascular: Negative.   Gastrointestinal: Negative.   Musculoskeletal: Negative.   Skin: Negative.   Neurological: Negative.   Hematological: Negative.   Psychiatric/Behavioral: Negative.   All other systems reviewed and are negative.   BP 132/90  Pulse 73  Ht 5\' 9"  (1.753 m)  Wt 194 lb 4 oz (88.111 kg)  BMI 28.69 kg/m2  Physical Exam  Nursing note and vitals reviewed. Constitutional: He is oriented to person, place, and time. He appears well-developed and well-nourished.  HENT:  Head: Normocephalic.  Nose: Nose normal.  Mouth/Throat: Oropharynx is clear and moist.  Eyes: Conjunctivae normal are normal. Pupils are equal, round, and reactive to light.  Neck: Normal range of motion. Neck supple. No JVD present.  Cardiovascular: Normal rate, regular rhythm, S1 normal, S2 normal, normal heart sounds and intact distal pulses.  Exam reveals no gallop and no friction rub.   No murmur heard. Pulmonary/Chest: Effort normal and breath sounds normal.  No respiratory distress. He has no wheezes. He has no rales. He exhibits no tenderness.  Abdominal: Soft. Bowel sounds are normal. He exhibits no distension. There is no tenderness.  Musculoskeletal: Normal range of motion. He exhibits no edema and no tenderness.  Lymphadenopathy:    He has no cervical adenopathy.  Neurological: He is alert and oriented to person, place, and time. Coordination normal.  Skin: Skin is warm and dry. No rash noted. No erythema.  Psychiatric: He has a normal mood and affect. His behavior is normal. Judgment and thought content normal.           Assessment and Plan

## 2012-04-07 NOTE — Assessment & Plan Note (Signed)
Blood pressure is well controlled on today's visit. No changes made to the medications. 

## 2012-04-07 NOTE — Assessment & Plan Note (Signed)
Etiology is not clear. We have suggested he hold his multivitamin which could contain niacin. If symptoms do not improve, we have suggested he move some of his medications to the evening, particularly his blood pressure medications. He could try Pepcid in the morning or noon in case this is a allergic response. Symptoms persist or get worse, we have suggested he call our office.

## 2012-04-07 NOTE — Assessment & Plan Note (Signed)
Currently with no symptoms of angina. No further workup at this time. Continue current medication regimen. 

## 2012-04-07 NOTE — Assessment & Plan Note (Signed)
No recent lab work for review. He will see his primary care physician.

## 2012-04-07 NOTE — Patient Instructions (Addendum)
You are doing well. Hold multivitamin or take in the late PM with aspirin for facial flushing Try to take other medications at night to see if this helps   Try pepcid early afternoon (histamine blocker), also benadryl  Please call us if you have new issues that need to be addressed before your next appt.  Your physician wants you to follow-up in: 6 months.  You will receive a reminder letter in the mail two months in advance. If you don't receive a letter, please call our office to schedule the follow-up appointment.

## 2012-06-30 ENCOUNTER — Telehealth: Payer: Self-pay

## 2012-06-30 NOTE — Telephone Encounter (Signed)
Late entry: I received [facility] from [facility] re:pt's atorvastatin, saying it appears che has not had any filled since December I was able to speak with pt and confirmed he is taking this medication as prescribed and is having it filled at local pharmacy He is not out of medication

## 2012-09-04 ENCOUNTER — Other Ambulatory Visit: Payer: Self-pay | Admitting: Cardiovascular Disease

## 2012-09-04 NOTE — Telephone Encounter (Signed)
Refilled [facility]artan sent [facility].

## 2012-10-24 ENCOUNTER — Encounter: Payer: Self-pay | Admitting: Gastroenterology

## 2012-10-31 ENCOUNTER — Other Ambulatory Visit: Payer: Self-pay | Admitting: Cardiovascular Disease

## 2012-10-31 NOTE — Telephone Encounter (Signed)
Refilled Carvedilol sent to [facility] pharmacy.

## 2012-11-03 ENCOUNTER — Encounter: Payer: Self-pay | Admitting: Cardiovascular Disease

## 2012-11-03 ENCOUNTER — Ambulatory Visit (INDEPENDENT_AMBULATORY_CARE_PROVIDER_SITE_OTHER): Payer: BC Managed Care – PPO | Admitting: Cardiovascular Disease

## 2012-11-03 ENCOUNTER — Other Ambulatory Visit: Payer: Self-pay | Admitting: Cardiovascular Disease

## 2012-11-03 VITALS — BP 122/82 | HR 63 | Ht 69.0 in | Wt 197.0 lb

## 2012-11-03 DIAGNOSIS — E785 Hyperlipidemia, unspecified: Secondary | ICD-10-CM

## 2012-11-03 DIAGNOSIS — I10 Essential (primary) hypertension: Secondary | ICD-10-CM

## 2012-11-03 DIAGNOSIS — I2510 Atherosclerotic heart disease of native coronary artery without angina pectoris: Secondary | ICD-10-CM

## 2012-11-03 MED ORDER — TADALAFIL 20 MG PO TABS: 20.0000 mg | ORAL_TABLET | Freq: Every day | ORAL | Status: DC | PRN

## 2012-11-03 NOTE — Progress Notes (Signed)
Patient ID: Alec Smith, male    DOB: 10-29-1955, 57 y.o.   MRN: [id number]  HPI Comments: Alec Smith is a pleasant 57 year old gentleman with history of coronary artery disease, stent placed to the proximal to mid LAD for occluded vessel, residual RCA disease estimated at 60% with intervention performed in 2010. Last stress test Stress test October 21, 2008 showed anterior ischemia which led to his cardiac catheterization.   Overall he feels well.  He's otherwise been tolerating his medications.  no symptoms of chest pain, shortness of breath. He states that his weight has increased and he has not been walking as much as he normally does. Also he stopped his testosterone out of concern given the recent news articles Continues to have rare episodes of facial flushing in the afternoon, much better than before. Possibly secondary to multivitamin. Continues to walk long distances at work  Total cholesterol 117, LDL 67, triglycerides 71, normal LFTs in December 2013  EKG shows normal sinus rhythm with rate 63 beats per minute, no significant ST or T wave changes   Outpatient Encounter Prescriptions as of 11/03/2012  Medication Sig Dispense Refill  . aspirin 325 MG tablet Take 175 mg by mouth daily.      Marland Kitchen atorvastatin (LIPITOR) 40 MG tablet Take 40 mg by mouth daily.      . carvedilol (COREG) 3.125 MG tablet TAKE 1 TABLET (3.125 MG TOTAL) BY MOUTH 2 (TWO) TIMES DAILY WITH A MEAL.  60 tablet  6  . cetirizine (ZYRTEC) 10 MG tablet Take 10 mg by mouth daily.        . clopidogrel (PLAVIX) 75 MG tablet TAKE 1 TABLET BY MOUTH DAILY.  90 tablet  3  . KRILL OIL PO Take 300 mg by mouth daily.       Marland Kitchen losartan (COZAAR) 50 MG tablet TAKE 1 TABLET (50 MG TOTAL) BY MOUTH DAILY.  30 tablet  3  . pantoprazole (PROTONIX) 40 MG tablet Take 40 mg by mouth daily.        . tadalafil (CIALIS) 5 MG tablet Take 1 tablet (5 mg total) by mouth daily as needed for erectile dysfunction.  30 tablet  6  . [DISCONTINUED]  testosterone (ANDROGEL) 50 MG/5GM GEL Place 5 g onto the skin daily.            Review of Systems  Constitutional: Negative.        Facial flushing episodes, rare  HENT: Negative.   Eyes: Negative.   Respiratory: Negative.   Cardiovascular: Negative.   Gastrointestinal: Negative.   Musculoskeletal: Negative.   Skin: Negative.   Neurological: Negative.   Psychiatric/Behavioral: Negative.   All other systems reviewed and are negative.   BP 122/82  Pulse 63  Ht 5\' 9"  (1.753 m)  Wt 197 lb (89.359 kg)  BMI 29.08 kg/m2  Physical Exam  Nursing note and vitals reviewed. Constitutional: He is oriented to person, place, and time. He appears well-developed and well-nourished.  HENT:  Head: Normocephalic.  Nose: Nose normal.  Mouth/Throat: Oropharynx is clear and moist.  Eyes: Conjunctivae are normal. Pupils are equal, round, and reactive to light.  Neck: Normal range of motion. Neck supple. No JVD present.  Cardiovascular: Normal rate, regular rhythm, S1 normal, S2 normal, normal heart sounds and intact distal pulses.  Exam reveals no gallop and no friction rub.   No murmur heard. Pulmonary/Chest: Effort normal and breath sounds normal. No respiratory distress. He has no wheezes. He has no rales.  He exhibits no tenderness.  Abdominal: Soft. Bowel sounds are normal. He exhibits no distension. There is no tenderness.  Musculoskeletal: Normal range of motion. He exhibits no edema and no tenderness.  Lymphadenopathy:    He has no cervical adenopathy.  Neurological: He is alert and oriented to person, place, and time. Coordination normal.  Skin: Skin is warm and dry. No rash noted. No erythema.  Psychiatric: He has a normal mood and affect. His behavior is normal. Judgment and thought content normal.      Assessment and Plan

## 2012-11-03 NOTE — Telephone Encounter (Signed)
[facility]

## 2012-11-03 NOTE — Patient Instructions (Addendum)
You are doing well. No medication changes were made.  Please call us if you have new issues that need to be addressed before your next appt.  Your physician wants you to follow-up in: 12 months.  You will receive a reminder letter in the mail two months in advance. If you don't receive a letter, please call our office to schedule the follow-up appointment. 

## 2012-11-03 NOTE — Assessment & Plan Note (Signed)
Cholesterol is at goal on the current lipid regimen. No changes to the medications were made.  

## 2012-11-03 NOTE — Assessment & Plan Note (Signed)
Blood pressure is well controlled on today's visit. No changes made to the medications. 

## 2012-11-03 NOTE — Assessment & Plan Note (Signed)
Currently with no symptoms of angina. No further workup at this time. Continue current medication regimen. 

## 2012-11-11 ENCOUNTER — Ambulatory Visit: Payer: BC Managed Care – PPO | Admitting: Nurse Practitioner

## 2012-12-04 ENCOUNTER — Encounter: Payer: Self-pay | Admitting: *Deleted

## 2012-12-30 ENCOUNTER — Other Ambulatory Visit: Payer: Self-pay | Admitting: Cardiovascular Disease

## 2012-12-31 ENCOUNTER — Other Ambulatory Visit: Payer: Self-pay | Admitting: *Deleted

## 2012-12-31 MED ORDER — ATORVASTATIN CALCIUM 40 MG PO TABS: 40.0000 mg | ORAL_TABLET | Freq: Every day | ORAL | Status: DC

## 2012-12-31 NOTE — Telephone Encounter (Signed)
Refilled [facility]atin [facility]. 

## 2013-01-04 ENCOUNTER — Other Ambulatory Visit: Payer: Self-pay | Admitting: Cardiovascular Disease

## 2013-03-19 ENCOUNTER — Other Ambulatory Visit: Payer: Self-pay

## 2013-05-04 ENCOUNTER — Other Ambulatory Visit: Payer: Self-pay

## 2013-05-04 MED ORDER — TADALAFIL 20 MG PO TABS: 20.0000 mg | ORAL_TABLET | Freq: Every day | ORAL | Status: DC | PRN

## 2013-05-04 NOTE — Telephone Encounter (Signed)
This encounter was created in error - please disregard.

## 2013-05-04 NOTE — Telephone Encounter (Signed)
Requested Prescriptions   Signed Prescriptions Disp Refills  . tadalafil (CIALIS) 20 MG tablet 5 tablet 5    Sig: Take 1 tablet (20 mg total) by mouth daily as needed for erectile dysfunction.    Authorizing Provider: Antonieta Iba    Ordering User: Kendrick Fries

## 2013-05-28 ENCOUNTER — Other Ambulatory Visit: Payer: Self-pay | Admitting: Cardiovascular Disease

## 2013-05-28 ENCOUNTER — Other Ambulatory Visit: Payer: Self-pay | Admitting: *Deleted

## 2013-05-28 MED ORDER — CARVEDILOL 3.125 MG PO TABS: ORAL_TABLET | ORAL | Status: DC

## 2013-05-28 NOTE — Telephone Encounter (Signed)
Requested Prescriptions   Signed Prescriptions Disp Refills  . carvedilol (COREG) 3.125 MG tablet 60 tablet 6    Sig: TAKE 1 TABLET (3.125 MG TOTAL) BY MOUTH 2 (TWO) TIMES DAILY WITH A MEAL.    Authorizing Provider: Minna Merritts    Ordering User: Britt Bottom

## 2013-07-30 ENCOUNTER — Other Ambulatory Visit: Payer: Self-pay | Admitting: Cardiovascular Disease

## 2013-10-13 ENCOUNTER — Encounter: Payer: Self-pay | Admitting: Nurse Practitioner

## 2013-10-30 ENCOUNTER — Telehealth: Payer: Self-pay | Admitting: *Deleted

## 2013-10-30 ENCOUNTER — Encounter: Payer: Self-pay | Admitting: Nurse Practitioner

## 2013-10-30 ENCOUNTER — Ambulatory Visit (INDEPENDENT_AMBULATORY_CARE_PROVIDER_SITE_OTHER): Payer: BC Managed Care – PPO | Admitting: Nurse Practitioner

## 2013-10-30 VITALS — BP 130/70 | HR 70 | Ht 69.0 in | Wt 203.8 lb

## 2013-10-30 DIAGNOSIS — K219 Gastro-esophageal reflux disease without esophagitis: Secondary | ICD-10-CM | POA: Insufficient documentation

## 2013-10-30 DIAGNOSIS — D689 Coagulation defect, unspecified: Secondary | ICD-10-CM

## 2013-10-30 DIAGNOSIS — Z1211 Encounter for screening for malignant neoplasm of colon: Secondary | ICD-10-CM

## 2013-10-30 MED ORDER — MOVIPREP 100 G PO SOLR: 1.0000 | ORAL | Status: DC

## 2013-10-30 NOTE — Progress Notes (Signed)
HPI :  History of Present Illness:  Patient is a 58 year old male, new to this practice, here to discuss a screening colonoscopy. Patient has never had a colonoscopy, and a family history colon cancer. He has no bowel changes, rectal bleeding, abdominal pain or other gastrointestinal complaints. Patient is on Plavix for history of coronary artery disease. He is status post multiple stents in 2010.   Past Medical History  Diagnosis Date  . CAD (coronary artery disease)   . GERD (gastroesophageal reflux disease)   . Hyperlipidemia   . Hypertension     Family History  Problem Relation Age of Onset  . Aneurysm Mother 15    AAA  . Heart failure Father   . Colon cancer Neg Hx   . Prostate cancer Paternal Grandfather   . Vaginal cancer Paternal Grandmother     spread to lungs   History  Substance Use Topics  . Smoking status: Never Smoker   . Smokeless tobacco: Never Used  . Alcohol Use: No   Current Outpatient Prescriptions  Medication Sig Dispense Refill  . aspirin 325 MG tablet Take 175 mg by mouth daily.      Marland Kitchen atorvastatin (LIPITOR) 40 MG tablet Take 1 tablet (40 mg total) by mouth daily.  30 tablet  3  . carvedilol (COREG) 3.125 MG tablet TAKE 1 TABLET (3.125 MG TOTAL) BY MOUTH 2 (TWO) TIMES DAILY WITH A MEAL.  60 tablet  6  . cetirizine (ZYRTEC) 10 MG tablet Take 10 mg by mouth daily.        . clopidogrel (PLAVIX) 75 MG tablet TAKE 1 TABLET BY MOUTH DAILY.  90 tablet  3  . KRILL OIL PO Take 300 mg by mouth daily.       Marland Kitchen losartan (COZAAR) 50 MG tablet TAKE 1 TABLET BY MOUTH EVERY DAY  30 tablet  6  . pantoprazole (PROTONIX) 40 MG tablet Take 40 mg by mouth daily.        . tadalafil (CIALIS) 5 MG tablet Take 5 mg by mouth as needed for erectile dysfunction.      . TESTOSTERONE IM Inject into the muscle. Injection weekly 0.5mg       . tadalafil (CIALIS) 5 MG tablet Take 1 tablet (5 mg total) by mouth daily as needed for erectile dysfunction.  30 tablet  6   No current  facility-administered medications for this visit.   No Known Allergies   Review of Systems: All systems reviewed and negative except where noted in HPI.   Physical Exam: BP 130/70  Pulse 70  Ht 5\' 9"  (1.753 m)  Wt 203 lb 12.8 oz (92.443 kg)  BMI 30.08 kg/m2 Constitutional: Pleasant,well-developed, white male in no acute distress. HEENT: Normocephalic and atraumatic. Conjunctivae are normal. No scleral icterus. Neck supple.  Cardiovascular: Normal rate, regular rhythm.  Pulmonary/chest: Effort normal and breath sounds normal. No wheezing, rales or rhonchi. Abdominal: Soft, nondistended, nontender. Bowel sounds active throughout. There are no masses palpable. No hepatomegaly. Extremities: no edema Lymphadenopathy: No cervical adenopathy noted. Neurological: Alert and oriented to person place and time. Skin: Skin is warm and dry. No rashes noted. Psychiatric: Normal mood and affect. Behavior is normal.   ASSESSMENT AND PLAN: 18. 58 year old male for an initial colon cancer screening.The risks, benefits, and alternatives to colonoscopy with possible biopsy and possible polypectomy were discussed with the patient and he consents to proceed.   2. CAD, s/p multiple stents 2010. Will contact patient's cardiologist about holding Plavix prior  to colonoscopy.  3. long-standing GERD. Patient has been on a PPI for about 8 years now. He is asymptomatic on PPI therapy. He did have an EGD approximately 8 years ago, we have requested that report to make sure there was no Barrett's esophagus

## 2013-10-30 NOTE — Telephone Encounter (Signed)
10/30/2013   RE: Alec Smith DOB: 1956-04-05 MRN: [id number]   Dear Dr. Ida Rogue,    We have scheduled the above patient for an endoscopic procedure. Our records show that he is on anticoagulation therapy.   Please advise as to how long the patient may come off his therapy of Plavix prior to the procedure, which is scheduled for 11-25-2013.  Please fax back/ or route the completed form to [place] at [phone].   Sincerely,    Tye Savoy NP-C     [place]

## 2013-10-30 NOTE — Telephone Encounter (Signed)
I called the patient and asked him to call me. I did advise on my message that [facility] didn't have an EGD on file for him.  I also called [facility] and they didn't have record of seeing him or an EGD on file.  I told him on the message that Dr. Olevia Perches has a double spot on 7-22  For both an EGD/COLON.  I advised we can change this to a double procedure if he wants to .  I asked him to call me either today or Monday. I did advise the double spot with Dr. Olevia Perches won't last long due to people calling to schedule.

## 2013-10-30 NOTE — Telephone Encounter (Signed)
Would stop Plavix 5 days prior to the procedure, restart after the colonoscopy

## 2013-10-30 NOTE — Telephone Encounter (Signed)
Spoke to the patient and advised him we couldn't find a copy of an EGD.  I asked if he wanted to reschedule to 7-22 with Dr. Olevia Perches when she had a double spot and he said yes . I changed the date to 12-02-2013 at 2:30 PM. Went over the instructions with him. I also advised him Dr. Rockey Situ said he can hold his Plavix for 5 days prior to 7-22. Patient verbalized the instructions.

## 2013-10-30 NOTE — Patient Instructions (Addendum)
You have been scheduled for a colonoscopy. Please follow written instructions given to you at your visit today.  Please pick up your prep kit at the pharmacy within the next 1-3 days. [facility] [address], [place]. If you use inhalers (even only as needed), please bring them with you on the day of your procedure. Your physician has requested that you go to www.startemmi.com and enter the access code given to you at your visit today. This web site gives a general overview about your procedure. However, you should still follow specific instructions given to you by our office regarding your preparation for the procedure.  We will contact you once we hear from Dr. Rockey Situ regarding the Plavix.

## 2013-10-30 NOTE — Progress Notes (Signed)
Reviewed and agree. May not need EGD if we can see the EGD report.

## 2013-11-02 NOTE — Telephone Encounter (Signed)
I called the patient and she did agree to rescheduling to 12-02-2013.  This way we will have a little more time to hear back regarding her anticoagulation medication, Plavix.

## 2013-11-06 ENCOUNTER — Encounter: Payer: Self-pay | Admitting: Internal Medicine

## 2013-11-20 ENCOUNTER — Ambulatory Visit (INDEPENDENT_AMBULATORY_CARE_PROVIDER_SITE_OTHER): Payer: BC Managed Care – PPO | Admitting: Cardiovascular Disease

## 2013-11-20 ENCOUNTER — Encounter: Payer: Self-pay | Admitting: Cardiovascular Disease

## 2013-11-20 ENCOUNTER — Ambulatory Visit: Payer: BC Managed Care – PPO | Admitting: Cardiovascular Disease

## 2013-11-20 VITALS — BP 146/91 | HR 74 | Ht 69.0 in | Wt 205.0 lb

## 2013-11-20 DIAGNOSIS — E785 Hyperlipidemia, unspecified: Secondary | ICD-10-CM

## 2013-11-20 DIAGNOSIS — I10 Essential (primary) hypertension: Secondary | ICD-10-CM

## 2013-11-20 DIAGNOSIS — I2510 Atherosclerotic heart disease of native coronary artery without angina pectoris: Secondary | ICD-10-CM

## 2013-11-20 NOTE — Patient Instructions (Signed)
You are doing well. No medication changes were made.  Please call us if you have new issues that need to be addressed before your next appt.  Your physician wants you to follow-up in: 12 months.  You will receive a reminder letter in the mail two months in advance. If you don't receive a letter, please call our office to schedule the follow-up appointment. 

## 2013-11-20 NOTE — Assessment & Plan Note (Signed)
We have ordered repeat lipid panel. He would like to do this in the fall

## 2013-11-20 NOTE — Assessment & Plan Note (Addendum)
No changes made to the medications. Blood pressure at home well controlled systolics in the 802M, diastolics in the 33K  he'll continue to monitor her blood pressure at work and at home. If blood pressure does run high, losartan could be increased up to 100 mg daily

## 2013-11-20 NOTE — Progress Notes (Signed)
Patient ID: Alec Smith, male    DOB: May 13, 1956, 58 y.o.   MRN: [id number]  HPI Comments: Alec Smith is a pleasant 58 year old gentleman with history of coronary artery disease, stent placed to the proximal to mid LAD for occluded vessel, residual RCA disease estimated at 60% with intervention performed in 2010. Last stress test Stress test October 21, 2008 showed anterior ischemia which led to his cardiac catheterization.   Overall he feels well.  He's otherwise been tolerating his medications.  no symptoms of chest pain, shortness of breath. He states that his weight has increased. He is up 10 pounds on his office visit last year.  currently uses testosterone injection once per week, seen in [place]  Previously had episodes of facial flushing in the afternoon, much better now. Possibly secondary to multivitamin. Recently joined a gym Blood pressure at home 130s over 80s  Total cholesterol 117, LDL 67, triglycerides 71, normal LFTs in December 2013 Total cholesterol in 2014 was 142, LDL 78 No labs this year  EKG shows normal sinus rhythm with rate 74 beats per minute, no significant ST or T wave changes   Outpatient Encounter Prescriptions as of 11/20/2013  Medication Sig  . aspirin 325 MG tablet Take 175 mg by mouth daily.  Marland Kitchen atorvastatin (LIPITOR) 40 MG tablet Take 1 tablet (40 mg total) by mouth daily.  . carvedilol (COREG) 3.125 MG tablet TAKE 1 TABLET (3.125 MG TOTAL) BY MOUTH 2 (TWO) TIMES DAILY WITH A MEAL.  . cetirizine (ZYRTEC) 10 MG tablet Take 10 mg by mouth daily.    . clopidogrel (PLAVIX) 75 MG tablet TAKE 1 TABLET BY MOUTH DAILY.  Marland Kitchen KRILL OIL PO Take 300 mg by mouth daily.   Marland Kitchen losartan (COZAAR) 50 MG tablet TAKE 1 TABLET BY MOUTH EVERY DAY  . MOVIPREP 100 G SOLR Take 1 kit (200 g total) by mouth as directed.  . pantoprazole (PROTONIX) 40 MG tablet Take 40 mg by mouth daily.    . tadalafil (CIALIS) 5 MG tablet Take 5 mg by mouth as needed for erectile dysfunction.   . TESTOSTERONE IM Inject into the muscle. Injection weekly 0.80m   Review of Systems  Constitutional: Negative.   HENT: Negative.   Eyes: Negative.   Respiratory: Negative.   Cardiovascular: Negative.   Gastrointestinal: Negative.   Endocrine: Negative.   Musculoskeletal: Negative.   Skin: Negative.   Allergic/Immunologic: Negative.   Neurological: Negative.   Hematological: Negative.   Psychiatric/Behavioral: Negative.   All other systems reviewed and are negative.  BP 146/91  Pulse 74  Ht '5\' 9"'  (1.753 m)  Wt 205 lb (92.987 kg)  BMI 30.26 kg/m2  Physical Exam  Nursing note and vitals reviewed. Constitutional: He is oriented to person, place, and time. He appears well-developed and well-nourished.  HENT:  Head: Normocephalic.  Nose: Nose normal.  Mouth/Throat: Oropharynx is clear and moist.  Eyes: Conjunctivae are normal. Pupils are equal, round, and reactive to light.  Neck: Normal range of motion. Neck supple. No JVD present.  Cardiovascular: Normal rate, regular rhythm, S1 normal, S2 normal, normal heart sounds and intact distal pulses.  Exam reveals no gallop and no friction rub.   No murmur heard. Pulmonary/Chest: Effort normal and breath sounds normal. No respiratory distress. He has no wheezes. He has no rales. He exhibits no tenderness.  Abdominal: Soft. Bowel sounds are normal. He exhibits no distension. There is no tenderness.  Musculoskeletal: Normal range of motion. He exhibits no edema and  no tenderness.  Lymphadenopathy:    He has no cervical adenopathy.  Neurological: He is alert and oriented to person, place, and time. Coordination normal.  Skin: Skin is warm and dry. No rash noted. No erythema.  Psychiatric: He has a normal mood and affect. His behavior is normal. Judgment and thought content normal.      Assessment and Plan

## 2013-11-20 NOTE — Assessment & Plan Note (Signed)
Currently with no symptoms of angina. No further workup at this time. Continue current medication regimen. 

## 2013-11-25 ENCOUNTER — Encounter: Payer: BC Managed Care – PPO | Admitting: Internal Medicine

## 2013-11-26 ENCOUNTER — Other Ambulatory Visit: Payer: Self-pay | Admitting: Cardiovascular Disease

## 2013-11-27 ENCOUNTER — Telehealth: Payer: Self-pay | Admitting: Cardiovascular Disease

## 2013-11-27 MED ORDER — CLOPIDOGREL BISULFATE 75 MG PO TABS: ORAL_TABLET | ORAL | Status: DC

## 2013-11-27 NOTE — Telephone Encounter (Signed)
Refill sent for plavix  

## 2013-11-27 NOTE — Telephone Encounter (Signed)
[facility] will not refill patient's plavix without auth.  Please call patient's wife to let her know when the med. Is called in.  [phone]

## 2013-12-02 ENCOUNTER — Encounter: Payer: BC Managed Care – PPO | Admitting: Internal Medicine

## 2014-01-01 ENCOUNTER — Other Ambulatory Visit: Payer: Self-pay | Admitting: Cardiovascular Disease

## 2014-01-06 ENCOUNTER — Other Ambulatory Visit: Payer: Self-pay | Admitting: Cardiovascular Disease

## 2014-03-03 ENCOUNTER — Other Ambulatory Visit: Payer: Self-pay | Admitting: *Deleted

## 2014-03-03 MED ORDER — LOSARTAN POTASSIUM 50 MG PO TABS: ORAL_TABLET | ORAL | Status: DC

## 2014-03-03 NOTE — Telephone Encounter (Signed)
Requested Prescriptions   Signed Prescriptions Disp Refills  . losartan (COZAAR) 50 MG tablet 30 tablet 3    Sig: TAKE 1 TABLET BY MOUTH EVERY DAY    Authorizing Provider: Minna Merritts    Ordering User: Britt Bottom

## 2014-06-29 ENCOUNTER — Other Ambulatory Visit: Payer: Self-pay | Admitting: Cardiovascular Disease

## 2014-07-05 ENCOUNTER — Other Ambulatory Visit: Payer: Self-pay | Admitting: Cardiovascular Disease

## 2014-07-24 ENCOUNTER — Other Ambulatory Visit: Payer: Self-pay | Admitting: Cardiovascular Disease

## 2014-08-06 ENCOUNTER — Other Ambulatory Visit: Payer: Self-pay | Admitting: Cardiovascular Disease

## 2014-08-06 ENCOUNTER — Encounter: Payer: Self-pay | Admitting: *Deleted

## 2014-08-10 ENCOUNTER — Telehealth: Payer: Self-pay

## 2014-08-10 ENCOUNTER — Telehealth: Payer: Self-pay | Admitting: *Deleted

## 2014-08-10 NOTE — Telephone Encounter (Signed)
Spoke w/ pt's wife.  She reports that has been lightheaded recently and had no energy. Pt does not have a BP cuff and has not checked this or HR recently. Pt reports a 5-8 lb wt loss recently.  Advised her to obtain a BP cuff and call w/ numbers in the event his meds need to be adjusted.  She is agreeable to this and will call back w/ readings.

## 2014-08-10 NOTE — Telephone Encounter (Signed)
Pt is calling stating pt is feeling a bit under the weather, and light headedness. He is not currently having it. Stated to patient she would need to first try PCP  Then if needed she can call us.

## 2014-08-10 NOTE — Telephone Encounter (Signed)
Pt wife called, states pt is experiencing dizziness, lack of energy, "feeling bad all over", states his PCP is out today, and wants to make sure it is not his heart

## 2014-08-27 ENCOUNTER — Other Ambulatory Visit: Payer: Self-pay | Admitting: Cardiovascular Disease

## 2014-10-05 ENCOUNTER — Encounter: Payer: Self-pay | Admitting: Primary Care

## 2014-10-05 ENCOUNTER — Ambulatory Visit (INDEPENDENT_AMBULATORY_CARE_PROVIDER_SITE_OTHER): Payer: BC Managed Care – PPO | Admitting: Primary Care

## 2014-10-05 VITALS — BP 132/88 | HR 73 | Temp 98.1°F | Ht 69.0 in | Wt 198.1 lb

## 2014-10-05 DIAGNOSIS — R5383 Other fatigue: Secondary | ICD-10-CM

## 2014-10-05 DIAGNOSIS — I10 Essential (primary) hypertension: Secondary | ICD-10-CM

## 2014-10-05 DIAGNOSIS — E785 Hyperlipidemia, unspecified: Secondary | ICD-10-CM

## 2014-10-05 DIAGNOSIS — K219 Gastro-esophageal reflux disease without esophagitis: Secondary | ICD-10-CM

## 2014-10-05 DIAGNOSIS — M2550 Pain in unspecified joint: Secondary | ICD-10-CM | POA: Insufficient documentation

## 2014-10-05 LAB — RHEUMATOID FACTOR

## 2014-10-05 NOTE — Patient Instructions (Signed)
Complete lab work prior to leaving today. I will notify you of your results. You will be contacted regarding your referral for your sleep study.  Please let us know if you have not heard back within one week.  Please schedule a physical with me in the next 2-3 months at your convienence. You will also schedule a lab only appointment one week prior. We will discuss your lab results during your physical. It was a pleasure to meet you today! Please don't hesitate to call me with any questions. Welcome to [facility]!

## 2014-10-05 NOTE — Progress Notes (Signed)
Subjective:    Patient ID: Alec Smith, male    DOB: Nov 10, 1955, 59 y.o.   MRN: [id number]  HPI  Alec Smith is a 59 year old male who presents today to establish care and discuss the problems mentioned below. Will obtain old records and review lab results from urology.  1) Heart Disease: No myocardial infarction. 1 catheterization in 2010 with 5 STENTS. Taking daily 325 mg aspirin. Follows with Dr. Rockey Situ annually and next appointment is Friday. No recent stress tests. Denies chest pain.  2) Hypertension: Diagnosed several years ago and is well managed on carvedilol 3.125 and losartan 50 mg.    3) Hyperlipidemia: Diagnosed in 2010 and is managed on atorvastatin 40 mg. Denies myalgisa  4) GERD: Diagnosed in 2006 and is managed on pantoprazole 40. He's tried weaning off but will have symptoms of epigastric fullness and burning in his esophagus without his medication.   5) Joint pain: Located to bilateral shoulders and neck that has been present for the past three months. His pain is constant and is achy in nature. His pain has not worsened but has not decreased. He's not taking anything routinly, but will take Naproxen occasionally with some improvement. Denies injury/trauma.   6) Generalized fatigue: Present for 3-4 months. He's currently taking either tylenol PM, Flexeril every so often for sleep. He reports snoring, feels daytime tiredness, most every night he will wake up between 1:30-3:30 am. He reports worry with his position at work, denies irritability, and anxiety. Denies symptoms of depression. Lab work completed at Urology with unremarkable results.  7) Low testesterone: Follows with Dr. Diona Fanti at [facility] and has been getting injections but has not had injections since April. Has an appointment with him this Friday. Labs were completd and TSH normal, PSA 3.4, Hemoglobin and Hematocrit normal, testosterone normal.  Review of Systems  Constitutional: Positive for  fatigue. Negative for unexpected weight change.  HENT: Negative for rhinorrhea.   Respiratory: Negative for cough and shortness of breath.   Cardiovascular: Negative for chest pain.  Gastrointestinal: Negative for diarrhea, constipation and blood in stool.  Genitourinary: Negative for dysuria, frequency and difficulty urinating.  Musculoskeletal: Positive for arthralgias.  Skin: Negative for rash.  Allergic/Immunologic: Positive for environmental allergies.  Neurological: Negative for dizziness, numbness and headaches.  Psychiatric/Behavioral:       Denies concerns for anxiety or depression.       Past Medical History  Diagnosis Date  . CAD (coronary artery disease)   . GERD (gastroesophageal reflux disease)   . Hyperlipidemia   . Hypertension     History   Social History  . Marital Status: Married    Spouse Name: N/A  . Number of Children: 2  . Years of Education: N/A   Occupational History  . school [employer] History Main Topics  . Smoking status: Never Smoker   . Smokeless tobacco: Never Used  . Alcohol Use: No  . Drug Use: No  . Sexual Activity: Not on file   Other Topics Concern  . Not on file   Social History Narrative   Married.   [employer] for [employer].   Children.    Enjoys playing golf, thrift store shopping.       Past Surgical History  Procedure Laterality Date  . Coronary angioplasty with stent placement  2010  . Back surgery  2000    Lower back  . Appendectomy  1966    Family History  Problem Relation Age of Onset  . Aneurysm Mother 68    AAA  . Heart failure Father   . Colon cancer Neg Hx   . Prostate cancer Paternal Grandfather   . Vaginal cancer Paternal Grandmother     spread to lungs    No Known Allergies  Current Outpatient Prescriptions on File Prior to Visit  Medication Sig Dispense Refill  . aspirin 325 MG tablet Take 175 mg by mouth daily.    Marland Kitchen atorvastatin (LIPITOR) 40 MG tablet Take  1 tablet (40 mg total) by mouth daily. 30 tablet 3  . carvedilol (COREG) 3.125 MG tablet TAKE 1 TABLET (3.125 MG TOTAL) BY MOUTH 2 (TWO) TIMES DAILY WITH A MEAL. 60 tablet 6  . cetirizine (ZYRTEC) 10 MG tablet Take 10 mg by mouth daily.      . clopidogrel (PLAVIX) 75 MG tablet TAKE 1 TABLET BY MOUTH EVERY DAY 90 tablet 3  . KRILL OIL PO Take 300 mg by mouth daily.     Marland Kitchen losartan (COZAAR) 50 MG tablet TAKE 1 TABLET BY MOUTH EVERY DAY 30 tablet 6  . pantoprazole (PROTONIX) 40 MG tablet Take 40 mg by mouth daily.       No current facility-administered medications on file prior to visit.    BP 132/88 mmHg  Pulse 73  Temp(Src) 98.1 F (36.7 C) (Oral)  Ht 5\' 9"  (1.753 m)  Wt 198 lb 1.9 oz (89.867 kg)  BMI 29.24 kg/m2  SpO2 98%    Objective:   Physical Exam  Constitutional: He is oriented to person, place, and time. He appears well-nourished.  Cardiovascular: Normal rate and regular rhythm.   Pulmonary/Chest: Effort normal and breath sounds normal.  Musculoskeletal: Normal range of motion.  Pain to neck and right shoulder. No decrease in ROM. No obvious deformity.  Neurological: He is alert and oriented to person, place, and time.  Skin: Skin is warm and dry.  Psychiatric: He has a normal mood and affect.          Assessment & Plan:

## 2014-10-05 NOTE — Progress Notes (Signed)
Pre visit review using our clinic review tool, if applicable. No additional management support is needed unless otherwise documented below in the visit note. 

## 2014-10-05 NOTE — Assessment & Plan Note (Addendum)
Present to c-spine at base of neck and right shoulder. No recent trauma or injury. Blood work today. ESR, ANA, RF. Tylenol and application of heat PRN. Will consider xray and PT if symptoms progress.

## 2014-10-05 NOTE — Assessment & Plan Note (Signed)
No recent lipid panel to date. Managed on Lipitor 40 mg Will measure during physical this summer.

## 2014-10-05 NOTE — Assessment & Plan Note (Signed)
Present for 3 months along with joint pain and may be related. Ordered ESR, RF, and ANA. Referral made for sleep study due to night snoring, waking, and daytime tiredness; especially given his cardiac history. TSH, H&H, Testosterone levels all normal according to blood work completed in April 2016 by [facility].

## 2014-10-05 NOTE — Assessment & Plan Note (Signed)
Managed by Dr. Rockey Situ. BP stable today. Will continue to observe.

## 2014-10-05 NOTE — Assessment & Plan Note (Signed)
Manages with Pantoprazole 40 mg. No symptoms. Continue

## 2014-10-06 LAB — SEDIMENTATION RATE: Sed Rate: 3 mm/hr (ref 0–22)

## 2014-10-06 LAB — C-REACTIVE PROTEIN: CRP: 0.1 mg/dL — AB (ref 0.5–20.0)

## 2014-10-06 LAB — ANA: Anti Nuclear Antibody(ANA): NEGATIVE

## 2014-10-08 ENCOUNTER — Encounter: Payer: Self-pay | Admitting: Cardiovascular Disease

## 2014-10-08 ENCOUNTER — Ambulatory Visit (INDEPENDENT_AMBULATORY_CARE_PROVIDER_SITE_OTHER): Payer: BC Managed Care – PPO | Admitting: Cardiovascular Disease

## 2014-10-08 VITALS — BP 140/82 | HR 84 | Ht 69.0 in | Wt 196.8 lb

## 2014-10-08 DIAGNOSIS — M2550 Pain in unspecified joint: Secondary | ICD-10-CM | POA: Diagnosis not present

## 2014-10-08 DIAGNOSIS — I10 Essential (primary) hypertension: Secondary | ICD-10-CM | POA: Diagnosis not present

## 2014-10-08 DIAGNOSIS — E785 Hyperlipidemia, unspecified: Secondary | ICD-10-CM

## 2014-10-08 DIAGNOSIS — I25119 Atherosclerotic heart disease of native coronary artery with unspecified angina pectoris: Secondary | ICD-10-CM | POA: Diagnosis not present

## 2014-10-08 DIAGNOSIS — R5383 Other fatigue: Secondary | ICD-10-CM | POA: Diagnosis not present

## 2014-10-08 NOTE — Assessment & Plan Note (Signed)
Symptoms discussed with him. Tightness in his paravertebral muscles Suggested massage, light stretching. Could consider chiropractic if symptoms persist He has Flexeril.. Less likely Lipitor

## 2014-10-08 NOTE — Assessment & Plan Note (Signed)
Etiology of his fatigue is unclear. Possibly from poor sleep hygiene. This might improve with regular exercise. Could try trazodone for sleep

## 2014-10-08 NOTE — Progress Notes (Signed)
Patient ID: Alec Smith, male    DOB: May 19, 1955, 59 y.o.   MRN: [id number]  HPI Comments: [name] is a pleasant 59 year old gentleman with history of coronary artery disease, stent placed to the proximal to mid LAD for occluded vessel, residual RCA disease estimated at 60% with intervention performed in 2010. Last stress test Stress test October 21, 2008 showed anterior ischemia which led to his cardiac catheterization. He presents for follow-up of his coronary artery disease  He reports that he is active, started a walking program, denies any chest pain or shortness of breath with exertion He has had fatigue, sometimes not sleeping well. Wakes up in the middle of the night, unable to get back to sleep unless he is on a sleeping pill. Reports having some tightness in his posterior shoulder area, neck.  Does not think this is secondary to his Lipitor. In general feels run down over the past several months. Recently stopped his testosterone but symptoms started prior to this. Joints feel achy. He has been sedentary for some time, only restarted his exercise program  EKG on today's visit shows normal sinus rhythm with rate 84 bpm, no significant ST or T-wave changes  Other past medical history Prior lab workTotal cholesterol 117, LDL 67, triglycerides 71, normal LFTs in December 2013 Total cholesterol in 2014 was 142, LDL 78  No Known Allergies  Current Outpatient Prescriptions on File Prior to Visit  Medication Sig Dispense Refill  . aspirin 325 MG tablet Take 175 mg by mouth daily.    Marland Kitchen atorvastatin (LIPITOR) 40 MG tablet Take 1 tablet (40 mg total) by mouth daily. 30 tablet 3  . carvedilol (COREG) 3.125 MG tablet TAKE 1 TABLET (3.125 MG TOTAL) BY MOUTH 2 (TWO) TIMES DAILY WITH A MEAL. 60 tablet 6  . cetirizine (ZYRTEC) 10 MG tablet Take 10 mg by mouth daily.      . clopidogrel (PLAVIX) 75 MG tablet TAKE 1 TABLET BY MOUTH EVERY DAY 90 tablet 3  . KRILL OIL PO Take 300 mg by mouth  daily.     Marland Kitchen losartan (COZAAR) 50 MG tablet TAKE 1 TABLET BY MOUTH EVERY DAY 30 tablet 6  . pantoprazole (PROTONIX) 40 MG tablet Take 40 mg by mouth daily.       No current facility-administered medications on file prior to visit.    Past Medical History  Diagnosis Date  . CAD (coronary artery disease)   . GERD (gastroesophageal reflux disease)   . Hyperlipidemia   . Hypertension     Past Surgical History  Procedure Laterality Date  . Coronary angioplasty with stent placement  2010  . Back surgery  2000    Lower back  . Appendectomy  1966    Social History  reports that he has never smoked. He has never used smokeless tobacco. He reports that he does not drink alcohol or use illicit drugs.  Family History family history includes Aneurysm (age of onset: 44) in his mother; Heart failure in his father; Prostate cancer in his paternal grandfather; Vaginal cancer in his paternal grandmother. There is no history of Colon cancer.    Review of Systems  Constitutional: Negative.   Respiratory: Negative.   Cardiovascular: Negative.   Gastrointestinal: Negative.   Musculoskeletal: Negative.   Neurological: Negative.   Hematological: Negative.   Psychiatric/Behavioral: Negative.   All other systems reviewed and are negative.  BP 140/82 mmHg  Pulse 84  Ht 5\' 9"  (1.753 m)  Wt 196 lb  12 oz (89.245 kg)  BMI 29.04 kg/m2  Physical Exam  Constitutional: He is oriented to person, place, and time. He appears well-developed and well-nourished.  HENT:  Head: Normocephalic.  Nose: Nose normal.  Mouth/Throat: Oropharynx is clear and moist.  Eyes: Conjunctivae are normal. Pupils are equal, round, and reactive to light.  Neck: Normal range of motion. Neck supple. No JVD present.  Cardiovascular: Normal rate, regular rhythm, S1 normal, S2 normal, normal heart sounds and intact distal pulses.  Exam reveals no gallop and no friction rub.   No murmur heard. Pulmonary/Chest: Effort normal  and breath sounds normal. No respiratory distress. He has no wheezes. He has no rales. He exhibits no tenderness.  Abdominal: Soft. Bowel sounds are normal. He exhibits no distension. There is no tenderness.  Musculoskeletal: Normal range of motion. He exhibits no edema or tenderness.  Lymphadenopathy:    He has no cervical adenopathy.  Neurological: He is alert and oriented to person, place, and time. Coordination normal.  Skin: Skin is warm and dry. No rash noted. No erythema.  Psychiatric: He has a normal mood and affect. His behavior is normal. Judgment and thought content normal.      Assessment and Plan   Nursing note and vitals reviewed.

## 2014-10-08 NOTE — Assessment & Plan Note (Signed)
Blood pressure is well controlled on today's visit. No changes made to the medications. 

## 2014-10-08 NOTE — Assessment & Plan Note (Signed)
Cholesterol previously well controlled. He is scheduled to have routine lab work with primary care

## 2014-10-08 NOTE — Patient Instructions (Signed)
You are doing well. No medication changes were made.  Please call us if you have new issues that need to be addressed before your next appt.  Your physician wants you to follow-up in: 12 months.  You will receive a reminder letter in the mail two months in advance. If you don't receive a letter, please call our office to schedule the follow-up appointment. 

## 2014-10-08 NOTE — Assessment & Plan Note (Signed)
Currently with no symptoms of angina. No further workup at this time. Continue current medication regimen. 

## 2014-11-01 ENCOUNTER — Other Ambulatory Visit: Payer: BC Managed Care – PPO

## 2014-11-05 ENCOUNTER — Ambulatory Visit (INDEPENDENT_AMBULATORY_CARE_PROVIDER_SITE_OTHER): Payer: BC Managed Care – PPO | Admitting: Primary Care

## 2014-11-05 ENCOUNTER — Encounter: Payer: Self-pay | Admitting: Primary Care

## 2014-11-05 ENCOUNTER — Ambulatory Visit: Payer: BC Managed Care – PPO | Admitting: Cardiovascular Disease

## 2014-11-05 VITALS — BP 126/82 | HR 61 | Temp 98.0°F | Ht 69.0 in | Wt 195.8 lb

## 2014-11-05 DIAGNOSIS — Z23 Encounter for immunization: Secondary | ICD-10-CM

## 2014-11-05 DIAGNOSIS — G4700 Insomnia, unspecified: Secondary | ICD-10-CM | POA: Diagnosis not present

## 2014-11-05 DIAGNOSIS — R7309 Other abnormal glucose: Secondary | ICD-10-CM

## 2014-11-05 DIAGNOSIS — Z0000 Encounter for general adult medical examination without abnormal findings: Secondary | ICD-10-CM

## 2014-11-05 DIAGNOSIS — R7303 Prediabetes: Secondary | ICD-10-CM

## 2014-11-05 DIAGNOSIS — E1165 Type 2 diabetes mellitus with hyperglycemia: Secondary | ICD-10-CM | POA: Insufficient documentation

## 2014-11-05 DIAGNOSIS — E785 Hyperlipidemia, unspecified: Secondary | ICD-10-CM | POA: Diagnosis not present

## 2014-11-05 DIAGNOSIS — I10 Essential (primary) hypertension: Secondary | ICD-10-CM

## 2014-11-05 DIAGNOSIS — E119 Type 2 diabetes mellitus without complications: Secondary | ICD-10-CM | POA: Insufficient documentation

## 2014-11-05 LAB — LIPID PANEL
CHOLESTEROL: 133 mg/dL (ref 0–200)
HDL: 35.7 mg/dL — ABNORMAL LOW (ref >39.00)
LDL Cholesterol: 71 mg/dL (ref 0–99)
NonHDL: 97.3
Total CHOL/HDL Ratio: 4
Triglycerides: 131 mg/dL (ref 0.0–149.0)
VLDL: 26.2 mg/dL (ref 0.0–40.0)

## 2014-11-05 LAB — COMPREHENSIVE METABOLIC PANEL
ALT: 53 U/L (ref 0–53)
AST: 29 U/L (ref 0–37)
Albumin: 4.2 g/dL (ref 3.5–5.2)
Alkaline Phosphatase: 68 U/L (ref 39–117)
BUN: 19 mg/dL (ref 6–23)
CALCIUM: 9.6 mg/dL (ref 8.4–10.5)
CHLORIDE: 102 meq/L (ref 96–112)
CO2: 31 meq/L (ref 19–32)
CREATININE: 1.35 mg/dL (ref 0.40–1.50)
GFR: 57.34 mL/min — AB (ref >60.00)
GLUCOSE: 106 mg/dL — AB (ref 70–99)
Potassium: 4.5 mEq/L (ref 3.5–5.1)
Sodium: 138 mEq/L (ref 135–145)
TOTAL PROTEIN: 7 g/dL (ref 6.0–8.3)
Total Bilirubin: 0.7 mg/dL (ref 0.2–1.2)

## 2014-11-05 LAB — CBC
HEMATOCRIT: 48.2 % (ref 39.0–52.0)
Hemoglobin: 16.2 g/dL (ref 13.0–17.0)
MCHC: 33.6 g/dL (ref 30.0–36.0)
MCV: 86.1 fl (ref 78.0–100.0)
Platelets: 171 10*3/uL (ref 150.0–400.0)
RBC: 5.59 Mil/uL (ref 4.22–5.81)
RDW: 15.2 % (ref 11.5–15.5)
WBC: 6.3 10*3/uL (ref 4.0–10.5)

## 2014-11-05 LAB — HEMOGLOBIN A1C: Hgb A1c MFr Bld: 6.4 % (ref 4.6–6.5)

## 2014-11-05 MED ORDER — TRAZODONE HCL 50 MG PO TABS: 25.0000 mg | ORAL_TABLET | Freq: Every evening | ORAL | Status: DC | PRN

## 2014-11-05 NOTE — Progress Notes (Signed)
Pre visit review using our clinic review tool, if applicable. No additional management support is needed unless otherwise documented below in the visit note. 

## 2014-11-05 NOTE — Progress Notes (Signed)
Subjective:    Patient ID: Alec Smith, male    DOB: Jan 01, 1956, 59 y.o.   MRN: [id number]  HPI  Alec Smith is a 59 year old male who presents today for complete physical.  Immunizations: -Tetanus: Completed in 2012 -Influenza: Did receive last season. -Pneumonia: Has not completed.  Diet: Endorses mostly healthy diet. Breakfast: waffles (Ego), oatmeal, or egg sandwich. Lunch: Small portions. Left overs. Dinner: Meat, vegetables. Limited sweet consumption. Drinks mostly water and sweet tea. Limited sodas. Exercise: Started walking since last visit and is walking 30-45 minutes at a moderate intensity and is gradually working to increase. Eye exam: Last completed in 2016. Dental exam: Last completed in 2016. He will go every six months. Colonoscopy: He's not had a colonoscopy in the past. He was scheduled a year ago and did not go through with the procedure.  1) Insomnia: He had been taking tylenol PM consistently for 4 months for difficulty falling and staying asleep. He has been taking some of his wife's trazodone for the past 3-4 weeks which has significantly helped to give him a good night's sleep. Prior to taking the Trazodone he was obtaining 3 hours or sleep, now he's getting 5-6 hours.  Review of Systems  Constitutional: Negative for unexpected weight change.  HENT: Negative for rhinorrhea.   Respiratory: Negative for cough and shortness of breath.   Cardiovascular: Negative for chest pain.  Gastrointestinal: Negative for diarrhea and constipation.  Genitourinary: Negative for difficulty urinating.       Met with urologist recently, reports prostate was normal. No longer taking the teste sterone.   Musculoskeletal: Negative for myalgias and arthralgias.  Skin:       Upcoming appointment with dermatologist  Neurological: Negative for dizziness and headaches.       Past Medical History  Diagnosis Date  . CAD (coronary artery disease)   . GERD (gastroesophageal reflux  disease)   . Hyperlipidemia   . Hypertension     History   Social History  . Marital Status: Married    Spouse Name: N/A  . Number of Children: 2  . Years of Education: N/A   Occupational History  . school [employer] History Main Topics  . Smoking status: Never Smoker   . Smokeless tobacco: Never Used  . Alcohol Use: No  . Drug Use: No  . Sexual Activity: Not on file   Other Topics Concern  . Not on file   Social History Narrative   Married.   [employer] for [employer].   Children.    Enjoys playing golf, thrift store shopping.       Past Surgical History  Procedure Laterality Date  . Coronary angioplasty with stent placement  2010  . Back surgery  2000    Lower back  . Appendectomy  1966    Family History  Problem Relation Age of Onset  . Aneurysm Mother 28    AAA  . Heart failure Father   . Colon cancer Neg Hx   . Prostate cancer Paternal Grandfather   . Vaginal cancer Paternal Grandmother     spread to lungs    No Known Allergies  Current Outpatient Prescriptions on File Prior to Visit  Medication Sig Dispense Refill  . aspirin 325 MG tablet Take 175 mg by mouth daily.    Marland Kitchen atorvastatin (LIPITOR) 40 MG tablet Take 1 tablet (40 mg total) by mouth daily. 30 tablet 3  . carvedilol (COREG) 3.125 MG  tablet TAKE 1 TABLET (3.125 MG TOTAL) BY MOUTH 2 (TWO) TIMES DAILY WITH A MEAL. 60 tablet 6  . cetirizine (ZYRTEC) 10 MG tablet Take 10 mg by mouth daily.      . clopidogrel (PLAVIX) 75 MG tablet TAKE 1 TABLET BY MOUTH EVERY DAY 90 tablet 3  . KRILL OIL PO Take 300 mg by mouth daily.     Marland Kitchen losartan (COZAAR) 50 MG tablet TAKE 1 TABLET BY MOUTH EVERY DAY 30 tablet 6  . pantoprazole (PROTONIX) 40 MG tablet Take 40 mg by mouth daily.       No current facility-administered medications on file prior to visit.    BP 126/82 mmHg  Pulse 61  Temp(Src) 98 F (36.7 C) (Oral)  Ht '5\' 9"'  (1.753 m)  Wt 195 lb 12.8 oz (88.814 kg)  BMI  28.90 kg/m2  SpO2 98%    Objective:   Physical Exam  Constitutional: He is oriented to person, place, and time. He appears well-nourished.  HENT:  Right Ear: Tympanic membrane, external ear and ear canal normal.  Left Ear: Tympanic membrane, external ear and ear canal normal.  Nose: Nose normal.  Mouth/Throat: Oropharynx is clear and moist.  Eyes: Conjunctivae and EOM are normal. Pupils are equal, round, and reactive to light.  Neck: Neck supple.  Cardiovascular: Normal rate and regular rhythm.   Pulmonary/Chest: Effort normal and breath sounds normal.  Abdominal: Soft. Bowel sounds are normal. There is no tenderness.  Musculoskeletal: Normal range of motion.  Lymphadenopathy:    He has no cervical adenopathy.  Neurological: He is alert and oriented to person, place, and time. He has normal reflexes. No cranial nerve deficit.  Skin: Skin is warm and dry.  Psychiatric: He has a normal mood and affect.          Assessment & Plan:

## 2014-11-05 NOTE — Assessment & Plan Note (Addendum)
Tetanus in 2012. No documentation of pneumovax and patient cannot recall having. Administered today due to co-morbidities. Discussed importance of healthy diet and exercise. Labs completed today. A1C of 6.4. Will trial lifestyle changes.  Recheck A1C in 3 months. Eye and dental exam completed. Never had colonoscopy. Referral made today.

## 2014-11-05 NOTE — Assessment & Plan Note (Signed)
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 Recently tried his wife's trazodone which has helped significantly. RX for trazodone 50 mg at bedtime sent to pharmacy. Will continue to monitor.

## 2014-11-05 NOTE — Assessment & Plan Note (Signed)
HDL slightly low. Will trial lifestyle changes and recheck in 3 months. If no improvement will increase lipitor.

## 2014-11-05 NOTE — Assessment & Plan Note (Addendum)
A1C of 6.4. Will notify patient and heavily emphasize the importance of healthy diet and exercise. Recheck A1C in 3 months.

## 2014-11-05 NOTE — Patient Instructions (Addendum)
You will be contacted regarding your referral to GI for your colonoscopy.  Please let us know if you have not heard back within one week.   I have sent Trazodone to your pharmacy for you to take for sleep.  Complete lab work prior to leaving today. I will notify you of your results.  You have been provided with a pneumonia vaccine today. You will be due for another vaccine at age 59.  Follow up in 4 months for re-evaluation.

## 2014-11-05 NOTE — Assessment & Plan Note (Signed)
BP stable today. Continue current meds.

## 2014-11-10 ENCOUNTER — Encounter: Payer: Self-pay | Admitting: Internal Medicine

## 2014-11-14 ENCOUNTER — Telehealth: Payer: BC Managed Care – PPO | Admitting: Physician Assistant

## 2014-11-14 DIAGNOSIS — R519 Headache, unspecified: Secondary | ICD-10-CM

## 2014-11-14 DIAGNOSIS — R42 Dizziness and giddiness: Secondary | ICD-10-CM

## 2014-11-14 DIAGNOSIS — R51 Headache: Principal | ICD-10-CM

## 2014-11-14 NOTE — Progress Notes (Signed)
Based on what you shared with me it looks like you have may a serious condition that should be evaluated in a face to face office visit. You are endorsing a severe headache and dizziness for two days which could be from something simple like sinus inflammation or infection, but could represent something more serious, especially giving dizziness.  Stay well hydrated and have a family member or friend take you to an Urgent Care or ER facility today for assessment.  You need a good physical examination so accurate diagnosis and treatment can be given so you can feel better. I hope you feel well soon.  If you are having a true medical emergency please call 911.  If you need an urgent face to face visit, [facility] has four urgent care centers for your convenience.  . [facility] a Provider at this Location  [address] [place], [place] [address] . 8 am to 8 pm Monday-Friday . 9 am to 7 pm Saturday-Sunday  . [facility] [place] a Provider at this Location  [address] [place], [place], [place] [address] . 8 am to 8 pm Monday-Friday . 9 am to 6 pm Saturday . 11 am to 6 pm Sunday   . [facility] [place] Get Driving Directions  [address] [place], [place] [address] . 8 am to 8 pm Monday-Friday . 9 am to 4 pm Saturday-Sunday   . [facility] (a walk in primary care provider)  [place] a Provider at this Location  [place], [place] [address] . 8 am to 8:30 pm Monday-Thursday . 8 am to 6 pm Friday . 8 am to 4 pm Saturday-Sunday   Your e-visit answers were reviewed by a board certified advanced clinical practitioner to complete your personal care plan.  Depending on the condition, your plan could have included both over the counter or  prescription medications.  You will get an e-mail in the next two days asking about your experience.  I hope that your e-visit has been valuable and will speed your recovery . Thank you for choosing an e-visit.

## 2014-12-30 ENCOUNTER — Telehealth: Payer: Self-pay

## 2014-12-30 ENCOUNTER — Encounter: Payer: Self-pay | Admitting: Primary Care

## 2014-12-30 ENCOUNTER — Telehealth: Payer: Self-pay | Admitting: Primary Care

## 2014-12-30 ENCOUNTER — Ambulatory Visit (INDEPENDENT_AMBULATORY_CARE_PROVIDER_SITE_OTHER): Payer: BC Managed Care – PPO | Admitting: Primary Care

## 2014-12-30 VITALS — BP 122/72 | HR 65 | Temp 97.7°F | Ht 69.0 in | Wt 200.8 lb

## 2014-12-30 DIAGNOSIS — R202 Paresthesia of skin: Principal | ICD-10-CM

## 2014-12-30 DIAGNOSIS — R200 Anesthesia of skin: Secondary | ICD-10-CM

## 2014-12-30 NOTE — Telephone Encounter (Signed)
Pt wife called, states pt right arm is numb, and his fingers are tingling. States pt has no other symptoms. States she has not called his PCP. Please call.

## 2014-12-30 NOTE — Telephone Encounter (Signed)
Pt has scheduled appt with PCP today 8/18 at 2:35pm with NP, per PCP note from this afternoon.

## 2014-12-30 NOTE — Telephone Encounter (Signed)
Vera with [facility] said that pt started with numbness in rt arm this morning and refused ED disposition and pt scheduled appt with Allie Bossier NP on 12/31/14. I spoke with pts wife (DPR signed) and pt started this morning with rt arm numbness and tingling from elbow to fingers; no dizziness, h/a,cp or SOB. Pt can use rt hand and not difficulty in walking or talking.pt does not want to go to ED but Pt rescheduled appt to see Allie Bossier NP 12/30/14 at 2:45 pm and if pt condition changes or worsens prior to appt pt will go to ED for eval.

## 2014-12-30 NOTE — Telephone Encounter (Signed)
Patient Name: Alec Smith  DOB: 1955/05/28    Initial Comment Caller states her husbands right arm is numb   Nurse Assessment  Nurse: Raphael Gibney, RN, Vanita Ingles Date/Time (Eastern Time): 12/30/2014 12:39:44 PM  Confirm and document reason for call. If symptomatic, describe symptoms. ---Caller states spouse right arm is numb. His fingers are tingling. Numbness started this am. He can move it fine. He is on Plavix. No dizziness or headache.  Has the patient traveled out of the country within the last 30 days? ---Not Applicable  Does the patient require triage? ---Yes  Related visit to physician within the last 2 weeks? ---No  Does the PT have any chronic conditions? (i.e. diabetes, asthma, etc.) ---Yes  List chronic conditions. ---5 cardiac stents     Guidelines    Guideline Title Affirmed Question Affirmed Notes  Neurologic Deficit [1] Numbness (i.e., loss of sensation) of the face, arm / hand, or leg / foot on one side of the body AND [2] sudden onset AND [3] present now    Final Disposition User   Call EMS 911 Now Raphael Gibney, RN, Vera    Comments  Called back line and spoke to [place] and gave report that pt is having numbness in right arm, no dizziness or headache and he can move his arm and numbness started this am and refuses to call 911 or go to the ER. States she will call pt back.   Disagree/Comply: Disagree  Disagree/Comply Reason: Disagree with instructions

## 2014-12-30 NOTE — Progress Notes (Signed)
Pre visit review using our clinic review tool, if applicable. No additional management support is needed unless otherwise documented below in the visit note. 

## 2014-12-30 NOTE — Progress Notes (Signed)
Subjective:    Patient ID: Alec Smith, male    DOB: [birth year], 59 y.o.   MRN: [id number]  HPI  Mr. Cabiness is a 59 year old male who presents today with a chief complaint of numbness. His numbness is present to his right arm and noticed this when waking this morning. Throughout the day his sympoms have dissipated and now is just feeling numbness to his finger tips. 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 Denies neck pain, shoulder pain, slurred speech, confusion, numbness to face or right lower extremity, facial drooping, changes in vision.   Review of Systems  Constitutional: Negative for fever and chills.  Eyes: Negative for visual disturbance.  Respiratory: Negative for shortness of breath.   Cardiovascular: Negative for chest pain.  Neurological: Positive for numbness. Negative for dizziness and weakness.  Psychiatric/Behavioral: Negative for sleep disturbance.       Past Medical History  Diagnosis Date  . CAD (coronary artery disease)   . GERD (gastroesophageal reflux disease)   . Hyperlipidemia   . Hypertension     Social History   Social History  . Marital Status: Married    Spouse Name: N/A  . Number of Children: 2  . Years of Education: N/A   Occupational History  . school [employer] History Main Topics  . Smoking status: Never Smoker   . Smokeless tobacco: Never Used  . Alcohol Use: No  . Drug Use: No  . Sexual Activity: Not on file   Other Topics Concern  . Not on file   Social History Narrative   Married.   Principle for middle school.   Children.    Enjoys playing golf, thrift store shopping.       Past Surgical History  Procedure Laterality Date  . Coronary angioplasty with stent placement  2010  . Back surgery  2000    Lower back  . Appendectomy  1966    Family History  Problem Relation Age of Onset  . Aneurysm Mother 67    AAA  . Heart failure Father   . Colon cancer Neg  Hx   . Prostate cancer Paternal Grandfather   . Vaginal cancer Paternal Grandmother     spread to lungs    No Known Allergies  Current Outpatient Prescriptions on File Prior to Visit  Medication Sig Dispense Refill  . aspirin 325 MG tablet Take 175 mg by mouth daily.    Marland Kitchen atorvastatin (LIPITOR) 40 MG tablet Take 1 tablet (40 mg total) by mouth daily. 30 tablet 3  . carvedilol (COREG) 3.125 MG tablet TAKE 1 TABLET (3.125 MG TOTAL) BY MOUTH 2 (TWO) TIMES DAILY WITH A MEAL. 60 tablet 6  . cetirizine (ZYRTEC) 10 MG tablet Take 10 mg by mouth daily.      . clopidogrel (PLAVIX) 75 MG tablet TAKE 1 TABLET BY MOUTH EVERY DAY 90 tablet 3  . KRILL OIL PO Take 300 mg by mouth daily.     Marland Kitchen losartan (COZAAR) 50 MG tablet TAKE 1 TABLET BY MOUTH EVERY DAY 30 tablet 6  . pantoprazole (PROTONIX) 40 MG tablet Take 40 mg by mouth daily.      . traZODone (DESYREL) 50 MG tablet Take 0.5-1 tablets (25-50 mg total) by mouth at bedtime as needed for sleep. 30 tablet 3   No current facility-administered medications on file prior to visit.    BP 122/72 mmHg  Pulse 65  Temp(Src)  97.7 F (36.5 C) (Oral)  Ht 5\' 9"  (1.753 m)  Wt 200 lb 12.8 oz (91.082 kg)  BMI 29.64 kg/m2  SpO2 97%    Objective:   Physical Exam  Constitutional: He is oriented to person, place, and time. He appears well-nourished.  Eyes: EOM are normal. Pupils are equal, round, and reactive to light.  Cardiovascular: Normal rate and regular rhythm.   Pulmonary/Chest: Effort normal and breath sounds normal.  Neurological: He is alert and oriented to person, place, and time. He has normal strength. No cranial nerve deficit or sensory deficit. GCS eye subscore is 4. GCS verbal subscore is 5. GCS motor subscore is 6.  Reflex Scores:      Patellar reflexes are 2+ on the right side and 2+ on the left side. Skin: Skin is warm and dry.          Assessment & Plan:  Numbness:  Present to right upper extremity that he noticed this morning  upon waking. Numbness has mostly resolved with the exception of mild tingling to 2nd and 3rd digit finger tips. Neuro exam negative. No facial drooping, arm drift, slurred speech. Suspect he may have slept on his arm in an awkward position, he endorses a history of this. Red flags that signal a stroke provided to patient. He will follow up if this occurs again.

## 2014-12-30 NOTE — Patient Instructions (Addendum)
You exam and symptoms do not indicate a stroke.  Please go to the Emergency Department if you experience: Sudden onset of: one sided numbness, weakness, dizziness, confusion, slurred speech, or facial drooping.  Follow up as scheduled in September for re-evaluation of Diabetes test.  It was a pleasure to see you today!

## 2014-12-31 ENCOUNTER — Ambulatory Visit: Payer: BC Managed Care – PPO | Admitting: Primary Care

## 2015-01-21 ENCOUNTER — Telehealth: Payer: Self-pay

## 2015-01-21 ENCOUNTER — Ambulatory Visit (INDEPENDENT_AMBULATORY_CARE_PROVIDER_SITE_OTHER): Payer: BC Managed Care – PPO | Admitting: Internal Medicine

## 2015-01-21 ENCOUNTER — Encounter: Payer: Self-pay | Admitting: Internal Medicine

## 2015-01-21 VITALS — BP 112/76 | HR 76 | Ht 68.0 in | Wt 196.0 lb

## 2015-01-21 DIAGNOSIS — Z9861 Coronary angioplasty status: Secondary | ICD-10-CM

## 2015-01-21 DIAGNOSIS — Z1211 Encounter for screening for malignant neoplasm of colon: Secondary | ICD-10-CM | POA: Diagnosis not present

## 2015-01-21 DIAGNOSIS — I2510 Atherosclerotic heart disease of native coronary artery without angina pectoris: Secondary | ICD-10-CM | POA: Diagnosis not present

## 2015-01-21 DIAGNOSIS — Z7902 Long term (current) use of antithrombotics/antiplatelets: Secondary | ICD-10-CM | POA: Diagnosis not present

## 2015-01-21 NOTE — Telephone Encounter (Signed)
    RE: Alec Smith DOB: 01/02/1956 MRN: [id number]   Dear Ida Rogue MD,    We have scheduled the above patient for an endoscopic procedure. Our records show that he is on anticoagulation therapy.   Please advise as to how long the patient may come off his therapy of Plavix prior to the colonoscopy procedure, which is scheduled for 02/02/15.  Please fax back/ or route the completed form to T[facility]stra [place], [place] ([place]) at [phone].   Sincerely,   Silvano Rusk, MD, [facility]

## 2015-01-21 NOTE — Progress Notes (Signed)
   Subjective:    Patient ID: Alec Smith, male    DOB: 20-Jul-1955, 59 y.o.   MRN: [id number] Cc: colon cancer screening - on clopidogrel HPI  Very nice 59 yo man - no prior colonoscopy here to discuss periprocedure management of clopidogrel. No GI sxs He has 5 caridiac stents (2010) No angina or dyspnea  Medications, allergies, past medical history, past surgical history, family history and social history are reviewed and updated in the EMR.  Review of Systems As above    Objective:   Physical Exam @BP  112/76 mmHg  Pulse 76  Ht 5\' 8"  (1.727 m)  Wt 196 lb (88.905 kg)  BMI 29.81 kg/m2@  General:  NAD Eyes:   anicteric Lungs:  clear Heart:  S1S2 no rubs, murmurs or gallops Abdomen:  soft and nontender, BS+    Assessment & Plan:   1. Special screening for malignant neoplasms, colon   2. Long term current use of antiplatelet agent clopidogrel   3. CAD S/P percutaneous coronary angioplasty/stents    Special screening for malignant neoplasms, colon Colonoscopy 02/02/15  The risks and benefits as well as alternatives of endoscopic procedure(s) have been discussed and reviewed. All questions answered. The patient agrees to proceed. Hold 5  days before procedure - will instruct when and how to resume after procedure. Risks and benefits of procedure including bleeding, perforation, infection, missed lesions, medication reactions and possible hospitalization or surgery if complications occur explained. Additional rare but real risk of cardiovascular event such as heart attack or ischemia/infarct of other organs off clopidogrel explained and need to seek urgent help if this occurs. Will communicate by phone or EMR with patient's prescribing provider that to confirm holding clopidogrel is reasonable in this case.

## 2015-01-21 NOTE — Patient Instructions (Signed)
You have been scheduled for a colonoscopy. Please follow written instructions given to you at your visit today.  Please pick up your over the counter prep supplies at the pharmacy . If you use inhalers (even only as needed), please bring them with you on the day of your procedure.   You will be contaced by our office prior to your procedure for directions on holding your Plavix.  If you do not hear from our office 1 week prior to your scheduled procedure, please call [phone] to discuss.   I appreciate the opportunity to care for you.

## 2015-01-21 NOTE — Telephone Encounter (Signed)
Acceptable risk for endoscopy 5 days before endoscopy, Would hold Plavix Start aspirin 81 mg daily

## 2015-01-21 NOTE — Assessment & Plan Note (Signed)
Colonoscopy 02/02/15  The risks and benefits as well as alternatives of endoscopic procedure(s) have been discussed and reviewed. All questions answered. The patient agrees to proceed. Hold 5  days before procedure - will instruct when and how to resume after procedure. Risks and benefits of procedure including bleeding, perforation, infection, missed lesions, medication reactions and possible hospitalization or surgery if complications occur explained. Additional rare but real risk of cardiovascular event such as heart attack or ischemia/infarct of other organs off clopidogrel explained and need to seek urgent help if this occurs. Will communicate by phone or EMR with patient's prescribing provider that to confirm holding clopidogrel is reasonable in this case.

## 2015-01-24 NOTE — Telephone Encounter (Signed)
Left message to call me back.

## 2015-01-24 NOTE — Telephone Encounter (Signed)
Spoke with patient and informed him to hold Plavix 5 days before procedure.  He is already on aspirin 81mg  BID.  He verbalized understanding.

## 2015-01-30 ENCOUNTER — Other Ambulatory Visit: Payer: Self-pay | Admitting: Cardiovascular Disease

## 2015-02-02 ENCOUNTER — Encounter: Payer: Self-pay | Admitting: Internal Medicine

## 2015-02-02 ENCOUNTER — Ambulatory Visit (AMBULATORY_SURGERY_CENTER): Payer: BC Managed Care – PPO | Admitting: Internal Medicine

## 2015-02-02 VITALS — BP 142/89 | HR 65 | Temp 96.7°F | Resp 18 | Ht 68.0 in | Wt 196.0 lb

## 2015-02-02 DIAGNOSIS — D123 Benign neoplasm of transverse colon: Secondary | ICD-10-CM

## 2015-02-02 DIAGNOSIS — Z1211 Encounter for screening for malignant neoplasm of colon: Secondary | ICD-10-CM

## 2015-02-02 MED ORDER — SODIUM CHLORIDE 0.9 % IV SOLN: 500.0000 mL | INTRAVENOUS | Status: DC

## 2015-02-02 NOTE — Progress Notes (Signed)
Patient awakening,vss,report to rn 

## 2015-02-02 NOTE — Op Note (Signed)
[place]  [address] [place], [address]   COLONOSCOPY PROCEDURE REPORT  PATIENT: Alec Smith, Alec Smith  MR#: [id number] BIRTHDATE: [birth year] , 59  yrs. old GENDER: male ENDOSCOPIST: Gatha Mayer, MD, [facility] PROCEDURE DATE:  02/02/2015 PROCEDURE:   Colonoscopy, screening and Colonoscopy with snare polypectomy First Screening Colonoscopy - Avg.  risk and is 50 yrs.  old or older Yes.  Prior Negative Screening - Now for repeat screening. N/A  History of Adenoma - Now for follow-up colonoscopy & has been > or = to 3 yrs.  N/A  Polyps removed today? Yes ASA CLASS:   Class III INDICATIONS:Screening for colonic neoplasia and Colorectal Neoplasm Risk Assessment for this procedure is average risk. MEDICATIONS: Propofol 200 mg IV and Monitored anesthesia care  DESCRIPTION OF PROCEDURE:   After the risks benefits and alternatives of the procedure were thoroughly explained, informed consent was obtained.  The digital rectal exam revealed no abnormalities of the rectum, revealed no prostatic nodules, and revealed the prostate was not enlarged.   The LB CF-H180AL Loaner [id number]  endoscope was introduced through the anus and advanced to the cecum, which was identified by both the appendix and ileocecal valve. No adverse events experienced.   The quality of the prep was good.  (MiraLax was used)  The instrument was then slowly withdrawn as the colon was fully examined. Estimated blood loss is zero unless otherwise noted in this procedure report.      COLON FINDINGS: A smooth sessile polyp measuring 5 mm in size was found in the transverse colon.  A polypectomy was performed with cold forceps.  A polypectomy was performed with a cold snare.  The resection was complete, the polyp tissue was completely retrieved and sent to histology.   The examination was otherwise normal. Retroflexed views revealed no abnormalities. The time to cecum = 1.8 Withdrawal time = 9.6   The  scope was withdrawn and the procedure completed. COMPLICATIONS: There were no immediate complications.  ENDOSCOPIC IMPRESSION: 1.   Sessile polyp was found in the transverse colon; polypectomy was performed with cold forceps; polypectomy was performed with a cold snare 2.   The examination was otherwise normal - good prep - first screen  RECOMMENDATIONS: 1.  Timing of repeat colonoscopy will be determined by pathology findings. 2.  Resume all meds including clopidogrel today  eSigned:  Gatha Mayer, MD, [facility] 02/02/2015 10:51 AM   cc: The Patient and Alma Friendly, NP

## 2015-02-02 NOTE — Progress Notes (Signed)
Called to room to assist during endoscopic procedure.  Patient ID and intended procedure confirmed with present staff. Received instructions for my participation in the procedure from the performing physician.  

## 2015-02-02 NOTE — Patient Instructions (Addendum)
I found and removed one small polyp that looks benign.  I will let you know pathology results and when to have another routine colonoscopy by mail.  Resume all medications including clopidogrel today.  I appreciate the opportunity to care for you. Gatha Mayer, MD, FACG       YOU HAD AN ENDOSCOPIC PROCEDURE TODAY AT [facility]:   Refer to the procedure report that was given to you for any specific questions about what was found during the examination.  If the procedure report does not answer your questions, please call your gastroenterologist to clarify.  If you requested that your care partner not be given the details of your procedure findings, then the procedure report has been included in a sealed envelope for you to review at your convenience later.  YOU SHOULD EXPECT: Some feelings of bloating in the abdomen. Passage of more gas than usual.  Walking can help get rid of the air that was put into your GI tract during the procedure and reduce the bloating. If you had a lower endoscopy (such as a colonoscopy or flexible sigmoidoscopy) you may notice spotting of blood in your stool or on the toilet paper. If you underwent a bowel prep for your procedure, you may not have a normal bowel movement for a few days.  Please Note:  You might notice some irritation and congestion in your nose or some drainage.  This is from the oxygen used during your procedure.  There is no need for concern and it should clear up in a day or so.  SYMPTOMS TO REPORT IMMEDIATELY:   Following lower endoscopy (colonoscopy or flexible sigmoidoscopy):  Excessive amounts of blood in the stool  Significant tenderness or worsening of abdominal pains  Swelling of the abdomen that is new, acute  Fever of 100F or higher    For urgent or emergent issues, a gastroenterologist can be reached at any hour by calling [phone].   DIET: Your first meal following the procedure should be a  small meal and then it is ok to progress to your normal diet. Heavy or fried foods are harder to digest and may make you feel nauseous or bloated.  Likewise, meals heavy in dairy and vegetables can increase bloating.  Drink plenty of fluids but you should avoid alcoholic beverages for 24 hours.  ACTIVITY:  You should plan to take it easy for the rest of today and you should NOT DRIVE or use heavy machinery until tomorrow (because of the sedation medicines used during the test).    FOLLOW UP: Our staff will call the number listed on your records the next business day following your procedure to check on you and address any questions or concerns that you may have regarding the information given to you following your procedure. If we do not reach you, we will leave a message.  However, if you are feeling well and you are not experiencing any problems, there is no need to return our call.  We will assume that you have returned to your regular daily activities without incident.  If any biopsies were taken you will be contacted by phone or by letter within the next 1-3 weeks.  Please call us at [phone] if you have not heard about the biopsies in 3 weeks.    SIGNATURES/CONFIDENTIALITY: You and/or your care partner have signed paperwork which will be entered into your electronic medical record.  These signatures attest to the fact that  that the information above on your After Visit Summary has been reviewed and is understood.  Full responsibility of the confidentiality of this discharge information lies with you and/or your care-partner.   Resume medications. Information given on polyps.

## 2015-02-03 ENCOUNTER — Telehealth: Payer: Self-pay

## 2015-02-03 ENCOUNTER — Ambulatory Visit: Payer: BC Managed Care – PPO | Admitting: Primary Care

## 2015-02-03 NOTE — Telephone Encounter (Signed)
Left message on answering machine. 

## 2015-02-09 ENCOUNTER — Encounter: Payer: Self-pay | Admitting: Internal Medicine

## 2015-02-09 DIAGNOSIS — Z86010 Personal history of colonic polyps: Secondary | ICD-10-CM

## 2015-02-09 DIAGNOSIS — Z860101 Personal history of adenomatous and serrated colon polyps: Secondary | ICD-10-CM

## 2015-02-09 HISTORY — DX: Personal history of colonic polyps: Z86.010

## 2015-02-09 HISTORY — DX: Personal history of adenomatous and serrated colon polyps: Z86.0101

## 2015-02-09 NOTE — Progress Notes (Signed)
Quick Note:  5 mm adenoma Repeat colonoscopy 2021 ______ 

## 2015-02-25 ENCOUNTER — Emergency Department (HOSPITAL_COMMUNITY)
Admission: EM | Admit: 2015-02-25 | Discharge: 2015-02-25 | Disposition: A | Discharge status: Home / Self Care | Payer: BC Managed Care – PPO | Attending: Emergency Medicine | Admitting: Emergency Medicine

## 2015-02-25 ENCOUNTER — Emergency Department (HOSPITAL_COMMUNITY): Payer: BC Managed Care – PPO

## 2015-02-25 ENCOUNTER — Other Ambulatory Visit: Payer: Self-pay

## 2015-02-25 ENCOUNTER — Encounter (HOSPITAL_COMMUNITY): Payer: Self-pay | Admitting: *Deleted

## 2015-02-25 DIAGNOSIS — E860 Dehydration: Secondary | ICD-10-CM | POA: Diagnosis not present

## 2015-02-25 DIAGNOSIS — R110 Nausea: Secondary | ICD-10-CM | POA: Diagnosis not present

## 2015-02-25 DIAGNOSIS — K219 Gastro-esophageal reflux disease without esophagitis: Secondary | ICD-10-CM | POA: Diagnosis not present

## 2015-02-25 DIAGNOSIS — Z7982 Long term (current) use of aspirin: Secondary | ICD-10-CM | POA: Diagnosis not present

## 2015-02-25 DIAGNOSIS — I10 Essential (primary) hypertension: Secondary | ICD-10-CM | POA: Insufficient documentation

## 2015-02-25 DIAGNOSIS — R42 Dizziness and giddiness: Secondary | ICD-10-CM | POA: Diagnosis not present

## 2015-02-25 DIAGNOSIS — E785 Hyperlipidemia, unspecified: Secondary | ICD-10-CM | POA: Diagnosis not present

## 2015-02-25 DIAGNOSIS — R55 Syncope and collapse: Secondary | ICD-10-CM

## 2015-02-25 DIAGNOSIS — Z79899 Other long term (current) drug therapy: Secondary | ICD-10-CM | POA: Diagnosis not present

## 2015-02-25 DIAGNOSIS — I2510 Atherosclerotic heart disease of native coronary artery without angina pectoris: Secondary | ICD-10-CM | POA: Diagnosis not present

## 2015-02-25 DIAGNOSIS — Z7902 Long term (current) use of antithrombotics/antiplatelets: Secondary | ICD-10-CM | POA: Insufficient documentation

## 2015-02-25 LAB — URINALYSIS, ROUTINE W REFLEX MICROSCOPIC
BILIRUBIN URINE: NEGATIVE
Glucose, UA: NEGATIVE mg/dL
Hgb urine dipstick: NEGATIVE
KETONES UR: NEGATIVE mg/dL
Leukocytes, UA: NEGATIVE
NITRITE: NEGATIVE
Protein, ur: NEGATIVE mg/dL
Specific Gravity, Urine: 1.024 (ref 1.005–1.030)
UROBILINOGEN UA: 1 mg/dL (ref 0.0–1.0)
pH: 6.5 (ref 5.0–8.0)

## 2015-02-25 LAB — CBG MONITORING, ED: GLUCOSE-CAPILLARY: 114 mg/dL — AB (ref 65–99)

## 2015-02-25 LAB — CBC
HCT: 42.8 % (ref 39.0–52.0)
Hemoglobin: 14.6 g/dL (ref 13.0–17.0)
MCH: 30.5 pg (ref 26.0–34.0)
MCHC: 34.1 g/dL (ref 30.0–36.0)
MCV: 89.4 fL (ref 78.0–100.0)
PLATELETS: 167 10*3/uL (ref 150–400)
RBC: 4.79 MIL/uL (ref 4.22–5.81)
RDW: 12.9 % (ref 11.5–15.5)
WBC: 6.5 10*3/uL (ref 4.0–10.5)

## 2015-02-25 LAB — BASIC METABOLIC PANEL
Anion gap: 5 (ref 5–15)
BUN: 20 mg/dL (ref 6–20)
CO2: 29 mmol/L (ref 22–32)
CREATININE: 1.15 mg/dL (ref 0.61–1.24)
Calcium: 9.1 mg/dL (ref 8.9–10.3)
Chloride: 101 mmol/L (ref 101–111)
GFR calc Af Amer: >60 mL/min (ref >60)
Glucose, Bld: 139 mg/dL — ABNORMAL HIGH (ref 65–99)
Potassium: 4.2 mmol/L (ref 3.5–5.1)
SODIUM: 135 mmol/L (ref 135–145)

## 2015-02-25 LAB — I-STAT TROPONIN, ED
TROPONIN I, POC: 0 ng/mL (ref 0.00–0.08)
Troponin i, poc: 0 ng/mL (ref 0.00–0.08)

## 2015-02-25 MED ORDER — ONDANSETRON HCL 4 MG/2ML IJ SOLN
4.0000 mg | Freq: Once | INTRAMUSCULAR | Status: AC
  Administered 2015-02-25: 4 mg via INTRAVENOUS
  Filled 2015-02-25: qty 2

## 2015-02-25 MED ORDER — SODIUM CHLORIDE 0.9 % IV BOLUS (SEPSIS)
500.0000 mL | Freq: Once | INTRAVENOUS | Status: AC
  Administered 2015-02-25: 500 mL via INTRAVENOUS

## 2015-02-25 NOTE — ED Provider Notes (Signed)
CSN: [id number]     Arrival date & time 02/25/15  1534 History   First MD Initiated Contact with Patient 02/25/15 1542     Chief Complaint  Patient presents with  . Near Syncope     (Consider location/radiation/quality/duration/timing/severity/associated sxs/prior Treatment) Patient is a 59 y.o. male presenting with near-syncope. The history is provided by the patient and medical records.  Near Syncope    59 year old male with history of hypertension, hyperlipidemia, GERD, coronary artery disease status post stent placement 5, presenting to the ED for a near syncopal episode.  Patient is the vice principal of a high school. He states today he was walking down the hall when he began to feel lightheaded and nauseated. He states he walks further to the nurse's office and sat down. She checked his blood pressure and gave him a bottle of water with minimal improvement of his symptoms. Nurse reports that he appeared cold, clammy, and diaphoretic. On arrival to ED, patient states lightheadedness has somewhat improved, however he still does not feel back to baseline. He continues to have some mild nausea without vomiting. He denies any chest pain or shortness of breath. He states he has had similar episodes in the past prior to his stent placement. No recent illness, fever, or chills. No sick contacts. No focal weakness, numbness, visual disturbance, changes in speech, headache, tinnitus, or gait disturbance. Patient is followed by cardiology, Dr. Rockey Situ.  Had follow-up over the summer, no changes to medication at that time, states he was doing well.  VSS.  Past Medical History  Diagnosis Date  . CAD (coronary artery disease)   . GERD (gastroesophageal reflux disease)   . Hyperlipidemia   . Hypertension   . Hx of adenomatous polyp of colon 02/09/2015   Past Surgical History  Procedure Laterality Date  . Coronary angioplasty with stent placement  2010  . Lumbar disc surgery  2000    Lower back   . Appendectomy  1966  . Tonsillectomy      age 15   Family History  Problem Relation Age of Onset  . Aneurysm Mother 88    AAA  . Heart failure Father   . Colon cancer Neg Hx   . Esophageal cancer Neg Hx   . Stomach cancer Neg Hx   . Rectal cancer Neg Hx   . Prostate cancer Paternal Grandfather   . Vaginal cancer Paternal Grandmother     spread to lungs   Social History  Substance Use Topics  . Smoking status: Never Smoker   . Smokeless tobacco: Never Used  . Alcohol Use: 0.0 oz/week    0 Standard drinks or equivalent per week     Comment: once or twice a year    Review of Systems  Cardiovascular: Positive for near-syncope.  Neurological: Positive for syncope (near syncope).  All other systems reviewed and are negative.     Allergies  Review of patient's allergies indicates no known allergies.  Home Medications   Prior to Admission medications   Medication Sig Start Date End Date Taking? Authorizing Provider  aspirin 325 MG tablet Take 175 mg by mouth daily.    Historical Provider, MD  atorvastatin (LIPITOR) 40 MG tablet Take 1 tablet (40 mg total) by mouth daily. 12/31/12   Minna Merritts, MD  carvedilol (COREG) 3.125 MG tablet TAKE 1 TABLET (3.125 MG TOTAL) BY MOUTH 2 (TWO) TIMES DAILY WITH A MEAL. 08/27/14   Minna Merritts, MD  cetirizine (ZYRTEC) 10 MG  tablet Take 10 mg by mouth daily.      Historical Provider, MD  clopidogrel (PLAVIX) 75 MG tablet TAKE 1 TABLET BY MOUTH EVERY DAY 01/31/15   Minna Merritts, MD  KRILL OIL PO Take 300 mg by mouth daily.     Historical Provider, MD  losartan (COZAAR) 50 MG tablet TAKE 1 TABLET BY MOUTH EVERY DAY 07/26/14   Minna Merritts, MD  pantoprazole (PROTONIX) 40 MG tablet Take 40 mg by mouth daily.      Historical Provider, MD   BP 127/77 mmHg  Pulse 68  Resp 23  Ht 5\' 9"  (1.753 m)  Wt 188 lb (85.276 kg)  BMI 27.75 kg/m2  SpO2 100%   Physical Exam  Constitutional: He is oriented to person, place, and time. He  appears well-developed and well-nourished. No distress.  HENT:  Head: Normocephalic and atraumatic.  Mouth/Throat: Oropharynx is clear and moist.  Eyes: Conjunctivae and EOM are normal. Pupils are equal, round, and reactive to light.  Neck: Normal range of motion. Neck supple.  Cardiovascular: Normal rate, regular rhythm and normal heart sounds.   Pulmonary/Chest: Effort normal and breath sounds normal. No respiratory distress. He has no wheezes.  Abdominal: Soft. Bowel sounds are normal. There is no tenderness. There is no guarding.  Musculoskeletal: Normal range of motion. He exhibits no edema.  Neurological: He is alert and oriented to person, place, and time.  AAOx3, answering questions appropriately; equal strength UE and LE bilaterally; CN grossly intact; moves all extremities appropriately without ataxia; no focal neuro deficits or facial asymmetry appreciated  Skin: Skin is warm and dry. He is not diaphoretic.  Psychiatric: He has a normal mood and affect.  Nursing note and vitals reviewed.   ED Course  Procedures (including critical care time) Labs Review Labs Reviewed  BASIC METABOLIC PANEL - Abnormal; Notable for the following:    Glucose, Bld 139 (*)    All other components within normal limits  CBG MONITORING, ED - Abnormal; Notable for the following:    Glucose-Capillary 114 (*)    All other components within normal limits  CBC  URINALYSIS, ROUTINE W REFLEX MICROSCOPIC (NOT AT [facility])  I-STAT TROPOININ, ED  Randolm Idol, ED    Imaging Review Dg Chest 2 View  02/25/2015  CLINICAL DATA:  Syncope and nausea EXAM: CHEST  2 VIEW COMPARISON:  10/22/2008 FINDINGS: The heart size and mediastinal contours are within normal limits. Both lungs are clear. The visualized skeletal structures are unremarkable. IMPRESSION: No active cardiopulmonary disease. Electronically Signed   By: Jerilynn Mages.  Shick M.D.   On: 02/25/2015 16:24   I have personally reviewed and evaluated these images  and lab results as part of my medical decision-making.   EKG Interpretation None      MDM   Final diagnoses:  Near syncope   59 year old male here with near-syncope. He does have significant cardiac history and cardiac risk factors.  EKG largely unchanged from prior, no signs of ischemia. Lab work is reassuring. Chest x-ray is clear. Orthostatic vital signs are appropriate, however patient with significant lightheadedness upon standing. Remains without CP or SOB here in ED.  VSS.  Given patient's history, I do have concern that this may be his anginal equivalent given presentation at onset of symptoms and continued symptoms here in the emergency department. Will consult hospitalist for observation admission.  Dr. Vanita Panda evaluated patient, agrees with assessment and plan for admission.  5:20 PM Case discussed with hospitalist, Dr. Wendee Beavers-- does  not feel that patient needs to be admitted.  He recommends fluids and discharge home.  I have discussed with him that patient has already received IVF which has not helped him symptomatically.  I had discussed my concerns for potential anginal equivalent given his cardiac RF and hx of CAD with stents, he still does not recommend admission.  He will evaluate patient in the ED and provide recommendations but will not admit.  Please see separate consultation note.  Delta troponin was obtained, remains negative.  Patient and family appears comfortable with plan to discharge home after speaking with Dr. Wendee Beavers.  Recommended close PCP follow-up.  Discussed plan with patient, he/she acknowledged understanding and agreed with plan of care.  Return precautions given for new or worsening symptoms.  Larene Pickett, PA-C 02/25/15 1944  Carmin Muskrat, MD 02/26/15 Carollee Massed

## 2015-02-25 NOTE — ED Notes (Signed)
Off unit with xray. 

## 2015-02-25 NOTE — Consult Note (Signed)
[facility] Medical Consultation  Alec Smith ZCH:[id number] DOB: July 05, 1955 DOA: 02/25/2015 PCP: Sheral Flow, NP   Requesting physician:  Date of consultation: 02/25/15 Reason for consultation:   Impression/Recommendations Active Problems: Dizziness/lightheadedness - occurred while standing - no chest pain, ekg with no ischemic changes, troponin negative - Would recommend administering fluid bolus given elevation in BUN/creatinine ratio as well as concentrated urine which is more consistent with dehydration. - Suspect that problems may be related to decreased intravascular volume. As such condition should continue to improve with improved oral intake and/or IV fluid administration. - Patient should follow-up with her physician should he develop any chest pain or any other unusual symptoms accompanying his dizziness. He is currently not dizzy and is agreeable with disposition planning. - Recommend Avoidance of any diuretic like tea, Caffeine, alcohol   I will followup again tomorrow. Please contact me if I can be of assistance in the meanwhile. Thank you for this consultation.  Chief Complaint: 59 year old presenting with complaints of dizziness/lightheadedness  HPI:  The patient is a 59 year old with history coronary angioplasty with stent placement, hyperlipidemia, hypertension, GERD, who presented to the ED complaining of lightheadedness/dizziness. This occurred when he went from sitting to standing. Given persistent dizziness patient presented to the ED for further evaluation. While in the ED patient has had EKG with no ischemic changes and negative troponin. He denies any chest pain. Denies any diarrhea fever or chills. The patient reports more strenuous exercise and normal as he had been working outside and he reports that he drinks a lot of tea.  Review of Systems:  12 point review of systems reviewed and negative unless otherwise mentioned above  Past Medical  History  Diagnosis Date  . CAD (coronary artery disease)   . GERD (gastroesophageal reflux disease)   . Hyperlipidemia   . Hypertension   . Hx of adenomatous polyp of colon 02/09/2015   Past Surgical History  Procedure Laterality Date  . Coronary angioplasty with stent placement  2010  . Lumbar disc surgery  2000    Lower back  . Appendectomy  1966  . Tonsillectomy      age 22   Social History:  reports that he has never smoked. He has never used smokeless tobacco. He reports that he drinks alcohol. He reports that he does not use illicit drugs.  No Known Allergies Family History  Problem Relation Age of Onset  . Aneurysm Mother 51    AAA  . Heart failure Father   . Colon cancer Neg Hx   . Esophageal cancer Neg Hx   . Stomach cancer Neg Hx   . Rectal cancer Neg Hx   . Prostate cancer Paternal Grandfather   . Vaginal cancer Paternal Grandmother     spread to lungs    Prior to Admission medications   Medication Sig Start Date End Date Taking? Authorizing Provider  aspirin 325 MG tablet Take 175 mg by mouth daily.   Yes Historical Provider, MD  atorvastatin (LIPITOR) 40 MG tablet Take 1 tablet (40 mg total) by mouth daily. 12/31/12  Yes Minna Merritts, MD  carvedilol (COREG) 6.25 MG tablet Take 6.25 mg by mouth 2 (two) times daily with a meal.   Yes Historical Provider, MD  cetirizine (ZYRTEC) 10 MG tablet Take 10 mg by mouth daily.     Yes Historical Provider, MD  clopidogrel (PLAVIX) 75 MG tablet TAKE 1 TABLET BY MOUTH EVERY DAY 01/31/15  Yes Minna Merritts, MD  KRILL  OIL PO Take 300 mg by mouth daily.    Yes Historical Provider, MD  losartan (COZAAR) 50 MG tablet TAKE 1 TABLET BY MOUTH EVERY DAY 07/26/14  Yes Minna Merritts, MD  Multiple Vitamin (MULTIVITAMIN WITH MINERALS) TABS tablet Take 1 tablet by mouth daily.   Yes Historical Provider, MD  pantoprazole (PROTONIX) 40 MG tablet Take 40 mg by mouth daily.     Yes Historical Provider, MD  carvedilol (COREG) 3.125 MG  tablet TAKE 1 TABLET (3.125 MG TOTAL) BY MOUTH 2 (TWO) TIMES DAILY WITH A MEAL. Patient not taking: Reported on 02/25/2015 08/27/14   Minna Merritts, MD   Physical Exam: Blood pressure 136/82, pulse 67, resp. rate 15, height 5\' 9"  (1.753 m), weight 85.276 kg (188 lb), SpO2 99 %. Filed Vitals:   02/25/15 1645  BP: 136/82  Pulse: 67  Resp: 15     General:  Pt in nad, alert and awake  Eyes: eomi, anicteric  ENT: normal exterior appearance, dry mucous membranes  Neck: supple, no goiter  Cardiovascular: rrr, no mrg  Respiratory: cta bl, no wheezes  Abdomen: Soft, nondistended, nontender  Skin: No cyanosis or clubbing  Musculoskeletal: Equal tone bilaterally  Psychiatric: Mood and affect appropriate  Neurologic: Answers questions appropriately, moves extremities equally  Labs on Admission:  Basic Metabolic Panel:  Recent Labs Lab 02/25/15 1559  NA 135  K 4.2  CL 101  CO2 29  GLUCOSE 139*  BUN 20  CREATININE 1.15  CALCIUM 9.1   Liver Function Tests: No results for input(s): AST, ALT, ALKPHOS, BILITOT, PROT, ALBUMIN in the last 168 hours. No results for input(s): LIPASE, AMYLASE in the last 168 hours. No results for input(s): AMMONIA in the last 168 hours. CBC:  Recent Labs Lab 02/25/15 1559  WBC 6.5  HGB 14.6  HCT 42.8  MCV 89.4  PLT 167   Cardiac Enzymes: No results for input(s): CKTOTAL, CKMB, CKMBINDEX, TROPONINI in the last 168 hours. BNP: Invalid input(s): POCBNP CBG:  Recent Labs Lab 02/25/15 1552  GLUCAP 114*    Radiological Exams on Admission: Dg Chest 2 View  02/25/2015  CLINICAL DATA:  Syncope and nausea EXAM: CHEST  2 VIEW COMPARISON:  10/22/2008 FINDINGS: The heart size and mediastinal contours are within normal limits. Both lungs are clear. The visualized skeletal structures are unremarkable. IMPRESSION: No active cardiopulmonary disease. Electronically Signed   By: Jerilynn Mages.  Shick M.D.   On: 02/25/2015 16:24    EKG: Independently  reviewed. Sinus rhythm with 1st degree av block  Time spent: > 50 minutes  Velvet Bathe [facility] Pager [phone]  If 7PM-7AM, please contact night-coverage [email or web address] Password [facility] 02/25/2015, 5:44 PM

## 2015-02-25 NOTE — Discharge Instructions (Signed)
Continue regular home medications. Follow-up your primary care physician. Return here for any new concerns-- chest pain, SOB, recurrent dizziness/weakness, etc.

## 2015-02-25 NOTE — ED Notes (Signed)
Pt states he felt extremely dizzy when standing up for orthstatic vitals

## 2015-02-25 NOTE — ED Notes (Signed)
Pt was at his job and began to feel dizzy, diaphoretic and nauseated.  EMS gave 324 aspirin, 20 gauge IV left hand, 140/80.  Denies passing out, denies chest pain.  Hx of 5 stents placed in 2010.

## 2015-02-25 NOTE — ED Notes (Signed)
CBG 114 

## 2015-03-01 ENCOUNTER — Encounter: Payer: Self-pay | Admitting: Primary Care

## 2015-03-01 ENCOUNTER — Ambulatory Visit (INDEPENDENT_AMBULATORY_CARE_PROVIDER_SITE_OTHER): Payer: BC Managed Care – PPO | Admitting: Primary Care

## 2015-03-01 VITALS — BP 124/80 | HR 70 | Temp 97.9°F | Ht 68.0 in | Wt 194.8 lb

## 2015-03-01 DIAGNOSIS — R55 Syncope and collapse: Secondary | ICD-10-CM

## 2015-03-01 DIAGNOSIS — R112 Nausea with vomiting, unspecified: Secondary | ICD-10-CM | POA: Diagnosis not present

## 2015-03-01 DIAGNOSIS — Z23 Encounter for immunization: Secondary | ICD-10-CM | POA: Diagnosis not present

## 2015-03-01 DIAGNOSIS — R42 Dizziness and giddiness: Secondary | ICD-10-CM

## 2015-03-01 NOTE — Progress Notes (Signed)
Subjective:    Patient ID: Alec Smith, male    DOB: 1955/07/14, 59 y.o.   MRN: [id number]  HPI  Mr. Paluch is a 59 year old male with a history of hyperlipidemia, atherosclerosis, CAD, HTN who presents today for hospital follow up. He presented to [facility] on 02/25/15 with a chief complaint of near syncope. He was evaluated and underwent testing with labs, ECG, urinalysis, and xray. His ECG was unchanged from prior, his lab work was reassuring, his chest xray was clear, and his orthostatic vitals were appropriate. He had consultation with a hospitalist while in the ED who determined he was stable to be discharged home from the ED.  Since his evaluation in the ED he's feeling overall better. He's had some dizziness, nausea with vomiting, and a "foggy" sensation over the weekend including yesterday. He's been able to keep down solids and liquids since yesterday. Denies fevers, diarrhea, abdominal pain, dizziness, chest pain, shortness of breath in the last 24 hours. On an average day he will have about 3 glasses of water daily.   Review of Systems  Respiratory: Negative for shortness of breath.   Cardiovascular: Negative for chest pain.  Gastrointestinal: Negative for nausea, vomiting and abdominal pain.  Neurological: Negative for dizziness and light-headedness.       Past Medical History  Diagnosis Date  . CAD (coronary artery disease)   . GERD (gastroesophageal reflux disease)   . Hyperlipidemia   . Hypertension   . Hx of adenomatous polyp of colon 02/09/2015    Social History   Social History  . Marital Status: Married    Spouse Name: N/A  . Number of Children: 2  . Years of Education: N/A   Occupational History  . school [employer] History Main Topics  . Smoking status: Never Smoker   . Smokeless tobacco: Never Used  . Alcohol Use: 0.0 oz/week    0 Standard drinks or equivalent per week     Comment: once or twice a year  . Drug Use: No  .  Sexual Activity: Not on file   Other Topics Concern  . Not on file   Social History Narrative   Married.   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   01/21/2015       Past Surgical History  Procedure Laterality Date  . Coronary angioplasty with stent placement  2010  . Lumbar disc surgery  2000    Lower back  . Appendectomy  1966  . Tonsillectomy      age 26    Family History  Problem Relation Age of Onset  . Aneurysm Mother 26    AAA  . Heart failure Father   . Colon cancer Neg Hx   . Esophageal cancer Neg Hx   . Stomach cancer Neg Hx   . Rectal cancer Neg Hx   . Prostate cancer Paternal Grandfather   . Vaginal cancer Paternal Grandmother     spread to lungs    No Known Allergies  Current Outpatient Prescriptions on File Prior to Visit  Medication Sig Dispense Refill  . aspirin 325 MG tablet Take 175 mg by mouth daily.    Marland Kitchen atorvastatin (LIPITOR) 40 MG tablet Take 1 tablet (40 mg total) by mouth daily. 30 tablet 3  . carvedilol (COREG) 3.125 MG tablet TAKE 1 TABLET (3.125 MG TOTAL) BY MOUTH 2 (TWO)  TIMES DAILY WITH A MEAL. 60 tablet 6  . cetirizine (ZYRTEC) 10 MG tablet Take 10 mg by mouth daily.      . clopidogrel (PLAVIX) 75 MG tablet TAKE 1 TABLET BY MOUTH EVERY DAY 90 tablet 3  . KRILL OIL PO Take 300 mg by mouth daily.     Marland Kitchen losartan (COZAAR) 50 MG tablet TAKE 1 TABLET BY MOUTH EVERY DAY 30 tablet 6  . Multiple Vitamin (MULTIVITAMIN WITH MINERALS) TABS tablet Take 1 tablet by mouth daily.    . pantoprazole (PROTONIX) 40 MG tablet Take 40 mg by mouth daily.       No current facility-administered medications on file prior to visit.    BP 124/80 mmHg  Pulse 70  Temp(Src) 97.9 F (36.6 C) (Oral)  Ht 5\' 8"  (1.727 m)  Wt 194 lb 12.8 oz (88.361 kg)  BMI 29.63 kg/m2  SpO2 98%    Objective:   Physical Exam  Constitutional: He appears well-nourished.  Cardiovascular: Normal rate and regular  rhythm.   Pulmonary/Chest: Effort normal and breath sounds normal.  Skin: Skin is warm and dry.  Psychiatric: He has a normal mood and affect.          Assessment & Plan:  Hospital Follow up:  Evaluated in the ED on 02/25/15 for near syncope, dizziness, nausea, vomiting. Labs, notes, and imaging reviewed from visit. Testing in ED reassuring. Overall improvement since ED visit and mostly well today. No dizziness, chest pain, nausea, vomiting in the past 24 hours. Holding liquids and solids well. Orthostatics slightly concerning, suspect this is due to dehydration. Discussed the importance of proper rehydration, and rising slowly when changing positions. He appears well, exam unremarkable. He is to notify me if any return in symptoms.

## 2015-03-01 NOTE — Patient Instructions (Addendum)
You do have some changes in your blood pressure with position changes which is a sign of dehydration. During the next several days you need to ensure that you change positions slowly and notify me if your symptoms return.  Your tests from the hospital are all normal.  You must increase your consumption of water. You need about 2 liters of water daily.  It was a pleasure to see you today!

## 2015-03-01 NOTE — Progress Notes (Signed)
Pre visit review using our clinic review tool, if applicable. No additional management support is needed unless otherwise documented below in the visit note. 

## 2015-03-07 ENCOUNTER — Ambulatory Visit: Payer: BC Managed Care – PPO | Admitting: Primary Care

## 2015-04-08 ENCOUNTER — Other Ambulatory Visit: Payer: Self-pay | Admitting: Cardiovascular Disease

## 2015-04-18 ENCOUNTER — Other Ambulatory Visit: Payer: Self-pay | Admitting: Cardiovascular Disease

## 2015-06-16 ENCOUNTER — Other Ambulatory Visit: Payer: Self-pay | Admitting: Cardiovascular Disease

## 2015-06-22 ENCOUNTER — Other Ambulatory Visit: Payer: Self-pay

## 2015-06-22 MED ORDER — LOSARTAN POTASSIUM 50 MG PO TABS: 50.0000 mg | ORAL_TABLET | Freq: Every day | ORAL | Status: DC

## 2015-07-10 ENCOUNTER — Encounter: Payer: Self-pay | Admitting: Primary Care

## 2015-07-10 ENCOUNTER — Other Ambulatory Visit: Payer: Self-pay | Admitting: Primary Care

## 2015-07-10 DIAGNOSIS — K219 Gastro-esophageal reflux disease without esophagitis: Secondary | ICD-10-CM

## 2015-07-10 MED ORDER — PANTOPRAZOLE SODIUM 40 MG PO TBEC: 40.0000 mg | DELAYED_RELEASE_TABLET | Freq: Every day | ORAL | Status: DC

## 2015-08-05 ENCOUNTER — Other Ambulatory Visit: Payer: Self-pay | Admitting: Cardiovascular Disease

## 2015-08-08 ENCOUNTER — Other Ambulatory Visit: Payer: Self-pay

## 2015-08-08 ENCOUNTER — Ambulatory Visit: Payer: BC Managed Care – PPO | Admitting: Family Medicine

## 2015-08-08 MED ORDER — ATORVASTATIN CALCIUM 40 MG PO TABS: 40.0000 mg | ORAL_TABLET | Freq: Every day | ORAL | Status: DC

## 2015-08-08 NOTE — Telephone Encounter (Signed)
Refill sent for atorvastatin 40 mg 90 day supply.

## 2015-08-09 ENCOUNTER — Other Ambulatory Visit: Payer: Self-pay | Admitting: *Deleted

## 2015-08-15 ENCOUNTER — Other Ambulatory Visit: Payer: Self-pay

## 2015-08-16 ENCOUNTER — Telehealth: Payer: Self-pay | Admitting: *Deleted

## 2015-08-16 NOTE — Telephone Encounter (Signed)
LMOVM for pt to contact office to verify if pt is taking Atorvastatin 40 mg qd or Atorvastatin 80 mg qd. Pt currently has both on medlist.

## 2015-08-22 MED ORDER — ATORVASTATIN CALCIUM 40 MG PO TABS: 40.0000 mg | ORAL_TABLET | Freq: Every day | ORAL | Status: DC

## 2015-08-22 NOTE — Telephone Encounter (Signed)
Notified Alec Smith regarding his atorvastatin dose and he is taking the atorvastatin 40 mg one tablet daily at bedtime.

## 2015-10-09 ENCOUNTER — Other Ambulatory Visit: Payer: Self-pay | Admitting: Cardiovascular Disease

## 2015-10-14 ENCOUNTER — Ambulatory Visit (INDEPENDENT_AMBULATORY_CARE_PROVIDER_SITE_OTHER): Payer: BC Managed Care – PPO | Admitting: Cardiovascular Disease

## 2015-10-14 ENCOUNTER — Encounter: Payer: Self-pay | Admitting: Cardiovascular Disease

## 2015-10-14 VITALS — BP 110/72 | HR 76 | Ht 68.0 in | Wt 197.8 lb

## 2015-10-14 DIAGNOSIS — E785 Hyperlipidemia, unspecified: Secondary | ICD-10-CM

## 2015-10-14 DIAGNOSIS — Z9861 Coronary angioplasty status: Secondary | ICD-10-CM

## 2015-10-14 DIAGNOSIS — I2510 Atherosclerotic heart disease of native coronary artery without angina pectoris: Secondary | ICD-10-CM | POA: Diagnosis not present

## 2015-10-14 DIAGNOSIS — I10 Essential (primary) hypertension: Secondary | ICD-10-CM

## 2015-10-14 DIAGNOSIS — I25119 Atherosclerotic heart disease of native coronary artery with unspecified angina pectoris: Secondary | ICD-10-CM

## 2015-10-14 DIAGNOSIS — R7303 Prediabetes: Secondary | ICD-10-CM

## 2015-10-14 MED ORDER — LOSARTAN POTASSIUM 50 MG PO TABS: ORAL_TABLET | ORAL | Status: DC

## 2015-10-14 NOTE — Progress Notes (Signed)
Patient ID: MILLION GIANNAKOPOULOS, male   DOB: [birth year], 60 y.o.   MRN: [id number] Cardiology Office Note  Date:  10/14/2015   ID:  BAYLOR KRITZER, DOB [birth year], MRN [id number]  PCP:  Sheral Flow, NP   Chief Complaint  Patient presents with  . other    12 month follow up. Meds reviewed by the patient verbally. "doing well."     HPI:  Mr. Braden is a pleasant 60 year old gentleman with history of coronary artery disease, stent placed to the proximal to mid LAD for occluded vessel, residual RCA disease estimated at 60% with intervention performed in 2010. Last stress test Stress test October 21, 2008 showed anterior ischemia which led to his cardiac catheterization. He presents for follow-up of his coronary artery disease  In follow-up today, he reports that he is well, denies any significant symptoms of chest discomfort or shortness of breath. No further muscle ache. He had back discomfort on his last clinic visit Significant work stress Feels tired at the end of the day, sedentary Weight has not changed, tolerating his medications  EKG on today's visit shows normal sinus rhythm with rate 78 bpm, no significant ST or T-wave changes  Other past medical history Prior lab workTotal cholesterol 117, LDL 67, triglycerides 71, normal LFTs in December 2013 Total cholesterol in 2014 was 142, LDL 54  PMH:   has a past medical history of CAD (coronary artery disease); GERD (gastroesophageal reflux disease); Hyperlipidemia; Hypertension; and adenomatous polyp of colon (02/09/2015).  PSH:    Past Surgical History  Procedure Laterality Date  . Coronary angioplasty with stent placement  2010  . Lumbar disc surgery  2000    Lower back  . Appendectomy  1966  . Tonsillectomy      age 86    Current Outpatient Prescriptions  Medication Sig Dispense Refill  . aspirin 325 MG tablet Take 175 mg by mouth daily.    Marland Kitchen atorvastatin (LIPITOR) 40 MG tablet Take 1 tablet (40 mg total) by mouth daily.  90 tablet 3  . carvedilol (COREG) 3.125 MG tablet TAKE 1 TABLET (3.125 MG TOTAL) BY MOUTH 2 (TWO) TIMES DAILY WITH A MEAL. 60 tablet 3  . cetirizine (ZYRTEC) 10 MG tablet Take 10 mg by mouth daily.      . clopidogrel (PLAVIX) 75 MG tablet TAKE 1 TABLET BY MOUTH EVERY DAY 90 tablet 3  . KRILL OIL PO Take 300 mg by mouth daily.     Marland Kitchen losartan (COZAAR) 50 MG tablet TAKE 1 TABLET (50 MG TOTAL) BY MOUTH DAILY. 90 tablet 3  . Multiple Vitamin (MULTIVITAMIN WITH MINERALS) TABS tablet Take 1 tablet by mouth daily.    . pantoprazole (PROTONIX) 40 MG tablet Take 1 tablet (40 mg total) by mouth daily. 90 tablet 1   No current facility-administered medications for this visit.    Allergies:   Review of patient's allergies indicates no known allergies.   Social History:  The patient  reports that he has never smoked. He has never used smokeless tobacco. He reports that he drinks alcohol. He reports that he does not use illicit drugs.   Family History:   family history includes Aneurysm (age of onset: 12) in his mother; Heart failure in his father; Prostate cancer in his paternal grandfather; Vaginal cancer in his paternal grandmother. There is no history of Colon cancer, Esophageal cancer, Stomach cancer, or Rectal cancer.    Review of Systems: Review of Systems  Constitutional: Negative.   Respiratory:  Negative.   Cardiovascular: Negative.   Gastrointestinal: Negative.   Musculoskeletal: Negative.   Neurological: Negative.   Psychiatric/Behavioral: Negative.   All other systems reviewed and are negative.    PHYSICAL EXAM: VS:  BP 110/72 mmHg  Pulse 76  Ht 5\' 8"  (1.727 m)  Wt 197 lb 12 oz (89.699 kg)  BMI 30.07 kg/m2 , BMI Body mass index is 30.07 kg/(m^2). GEN: Well nourished, well developed, in no acute distress HEENT: normal Neck: no JVD, carotid bruits, or masses Cardiac: RRR; no murmurs, rubs, or gallops,no edema  Respiratory:  clear to auscultation bilaterally, normal work of  breathing GI: soft, nontender, nondistended, + BS MS: no deformity or atrophy Skin: warm and dry, no rash Neuro:  Strength and sensation are intact Psych: euthymic mood, full affect    Recent Labs: 11/05/2014: ALT 53 02/25/2015: BUN 20; Creatinine, Ser 1.15; Hemoglobin 14.6; Platelets 167; Potassium 4.2; Sodium 135    Lipid Panel Lab Results  Component Value Date   CHOL 133 11/05/2014   HDL 35.70* 11/05/2014   LDLCALC 71 11/05/2014   TRIG 131.0 11/05/2014      Wt Readings from Last 3 Encounters:  10/14/15 197 lb 12 oz (89.699 kg)  03/01/15 194 lb 12.8 oz (88.361 kg)  02/25/15 188 lb (85.276 kg)      ASSESSMENT AND PLAN:  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 No further workup at this time. Continue current medication regimen.  Benign essential HTN -  Blood pressure is well controlled on today's visit. No changes made to the medications.  Hyperlipidemia Cholesterol is at goal on the current lipid regimen. No changes to the medications were made.  CAD S/P percutaneous coronary angioplasty/stents He would like to stay on plavix  Borderline diabetes We have encouraged continued exercise, careful diet management in an effort to lose weight.   Disposition:   F/U  12 months   Total encounter time more than 15 minutes  Greater than 50% was spent in counseling and coordination of care with the patient     Orders Placed This Encounter  Procedures  . EKG 12-Lead     Signed, Esmond Plants, M.D., Ph.D. 10/14/2015  [place], [place]

## 2015-10-14 NOTE — Patient Instructions (Signed)
You are doing well. No medication changes were made.  Please call us if you have new issues that need to be addressed before your next appt.  Your physician wants you to follow-up in: 12 months.  You will receive a reminder letter in the mail two months in advance. If you don't receive a letter, please call our office to schedule the follow-up appointment. 

## 2015-10-31 ENCOUNTER — Other Ambulatory Visit: Payer: Self-pay | Admitting: Primary Care

## 2015-10-31 ENCOUNTER — Ambulatory Visit (INDEPENDENT_AMBULATORY_CARE_PROVIDER_SITE_OTHER): Payer: BC Managed Care – PPO | Admitting: Primary Care

## 2015-10-31 ENCOUNTER — Encounter: Payer: Self-pay | Admitting: Primary Care

## 2015-10-31 VITALS — BP 120/84 | HR 80 | Temp 97.9°F | Ht 68.0 in | Wt 191.4 lb

## 2015-10-31 DIAGNOSIS — R112 Nausea with vomiting, unspecified: Secondary | ICD-10-CM | POA: Diagnosis not present

## 2015-10-31 DIAGNOSIS — R739 Hyperglycemia, unspecified: Secondary | ICD-10-CM

## 2015-10-31 DIAGNOSIS — R42 Dizziness and giddiness: Secondary | ICD-10-CM | POA: Diagnosis not present

## 2015-10-31 LAB — COMPREHENSIVE METABOLIC PANEL
ALT: 33 U/L (ref 0–53)
AST: 20 U/L (ref 0–37)
Albumin: 4.6 g/dL (ref 3.5–5.2)
Alkaline Phosphatase: 74 U/L (ref 39–117)
BUN: 26 mg/dL — ABNORMAL HIGH (ref 6–23)
CO2: 26 meq/L (ref 19–32)
Calcium: 10.2 mg/dL (ref 8.4–10.5)
Chloride: 99 mEq/L (ref 96–112)
Creatinine, Ser: 1.06 mg/dL (ref 0.40–1.50)
GFR: 75.55 mL/min (ref >60.00)
GLUCOSE: 124 mg/dL — AB (ref 70–99)
POTASSIUM: 4.6 meq/L (ref 3.5–5.1)
SODIUM: 136 meq/L (ref 135–145)
Total Bilirubin: 0.6 mg/dL (ref 0.2–1.2)
Total Protein: 7.9 g/dL (ref 6.0–8.3)

## 2015-10-31 LAB — CBC WITH DIFFERENTIAL/PLATELET
Basophils Absolute: 0 10*3/uL (ref 0.0–0.1)
Basophils Relative: 0.2 % (ref 0.0–3.0)
EOS PCT: 0.8 % (ref 0.0–5.0)
Eosinophils Absolute: 0.1 10*3/uL (ref 0.0–0.7)
HCT: 50.1 % (ref 39.0–52.0)
Hemoglobin: 16.9 g/dL (ref 13.0–17.0)
LYMPHS ABS: 1.4 10*3/uL (ref 0.7–4.0)
Lymphocytes Relative: 15.9 % (ref 12.0–46.0)
MCHC: 33.7 g/dL (ref 30.0–36.0)
MCV: 88.7 fl (ref 78.0–100.0)
MONOS PCT: 6.5 % (ref 3.0–12.0)
Monocytes Absolute: 0.6 10*3/uL (ref 0.1–1.0)
NEUTROS ABS: 7 10*3/uL (ref 1.4–7.7)
NEUTROS PCT: 76.6 % (ref 43.0–77.0)
PLATELETS: 235 10*3/uL (ref 150.0–400.0)
RBC: 5.65 Mil/uL (ref 4.22–5.81)
RDW: 14.2 % (ref 11.5–15.5)
WBC: 9.1 10*3/uL (ref 4.0–10.5)

## 2015-10-31 MED ORDER — ONDANSETRON 4 MG PO TBDP: 4.0000 mg | ORAL_TABLET | Freq: Three times a day (TID) | ORAL | Status: DC | PRN

## 2015-10-31 NOTE — Patient Instructions (Signed)
Complete lab work prior to leaving today.   Start Zofran tablets as needed for nausea. Melt 1 tablet in your mouth every 8 hours as needed for nausea.  Try to increase intake of water to prevent dehydration.  Try Meclizine tablets as needed for dizziness. These may be purchased over the counter. Caution as this may cause drowsiness.   I will be in touch with you later today.  It was a pleasure to see you today!

## 2015-10-31 NOTE — Progress Notes (Signed)
Subjective:    Patient ID: Alec Smith, male    DOB: 1955/06/10, 60 y.o.   MRN: [id number]  HPI  Mr. Voytko is a 60 year old male with a history of borderline diabetes who presents today with a chief complaint of dizziness. He also reports nausea, vomiting, decrease in appetite, fatigue, and headache. He is currently managed on Zyrtec for allergic rhinitis. His symptoms initially began Friday June 9th which lasted for 2-3 days with improvement Wednesday last week. His symptoms returned again yesterday morning upon waking with the same symptoms (nausea, vomiting, dizziness, decrease in appetite).   He's unable to keep down water and food without vomiting. Denies recent camping or outdoor activities, working out in his yard, fevers, rashes, abdominal pain, chest pain, sick contacts, diarrhea, constipation, ear pain, sore throat. He describes his dizziness as though the room is spinning. He has been under a lot of stress at work recently, more so than usual. He does recall noticing a spot to his inner leg about one month ago that has not completely healed. He is unsure if this was an insect bite.   Review of Systems  Constitutional: Positive for fatigue. Negative for fever and chills.  HENT: Negative for sore throat.   Respiratory: Negative for cough and shortness of breath.   Cardiovascular: Negative for chest pain.  Gastrointestinal: Positive for nausea and vomiting. Negative for abdominal pain, diarrhea and constipation.  Musculoskeletal: Negative for myalgias.  Neurological: Positive for dizziness and headaches.       Past Medical History  Diagnosis Date  . CAD (coronary artery disease)   . GERD (gastroesophageal reflux disease)   . Hyperlipidemia   . Hypertension   . Hx of adenomatous polyp of colon 02/09/2015     Social History   Social History  . Marital Status: Married    Spouse Name: N/A  . Number of Children: 2  . Years of Education: N/A   Occupational History  .  school [place] History Main Topics  . Smoking status: Never Smoker   . Smokeless tobacco: Never Used  . Alcohol Use: 0.0 oz/week    0 Standard drinks or equivalent per week     Comment: once or twice a year  . Drug Use: No  . Sexual Activity: Not on file   Other Topics Concern  . Not on file   Social History Narrative   Married.   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   01/21/2015       Past Surgical History  Procedure Laterality Date  . Coronary angioplasty with stent placement  2010  . Lumbar disc surgery  2000    Lower back  . Appendectomy  1966  . Tonsillectomy      age 35    Family History  Problem Relation Age of Onset  . Aneurysm Mother 28    AAA  . Heart failure Father   . Colon cancer Neg Hx   . Esophageal cancer Neg Hx   . Stomach cancer Neg Hx   . Rectal cancer Neg Hx   . Prostate cancer Paternal Grandfather   . Vaginal cancer Paternal Grandmother     spread to lungs    No Known Allergies  Current Outpatient Prescriptions on File Prior to Visit  Medication Sig Dispense Refill  . aspirin 325 MG tablet Take 175 mg by mouth  daily.    . atorvastatin (LIPITOR) 40 MG tablet Take 1 tablet (40 mg total) by mouth daily. 90 tablet 3  . carvedilol (COREG) 3.125 MG tablet TAKE 1 TABLET (3.125 MG TOTAL) BY MOUTH 2 (TWO) TIMES DAILY WITH A MEAL. 60 tablet 3  . cetirizine (ZYRTEC) 10 MG tablet Take 10 mg by mouth daily.      . clopidogrel (PLAVIX) 75 MG tablet TAKE 1 TABLET BY MOUTH EVERY DAY 90 tablet 3  . KRILL OIL PO Take 300 mg by mouth daily.     Marland Kitchen losartan (COZAAR) 50 MG tablet TAKE 1 TABLET (50 MG TOTAL) BY MOUTH DAILY. 90 tablet 3  . Multiple Vitamin (MULTIVITAMIN WITH MINERALS) TABS tablet Take 1 tablet by mouth daily.    . pantoprazole (PROTONIX) 40 MG tablet Take 1 tablet (40 mg total) by mouth daily. 90 tablet 1   No current facility-administered medications  on file prior to visit.    BP 120/84 mmHg  Pulse 80  Temp(Src) 97.9 F (36.6 C) (Oral)  Ht 5\' 8"  (1.727 m)  Wt 191 lb 6.4 oz (86.818 kg)  BMI 29.11 kg/m2  SpO2 97%    Objective:   Physical Exam  Constitutional: He appears well-nourished. He does not appear ill.  HENT:  Right Ear: Tympanic membrane and ear canal normal.  Left Ear: Tympanic membrane and ear canal normal.  Nose: No mucosal edema. Right sinus exhibits no maxillary sinus tenderness and no frontal sinus tenderness. Left sinus exhibits no maxillary sinus tenderness and no frontal sinus tenderness.  Mouth/Throat: Oropharynx is clear and moist.  Eyes: Conjunctivae are normal.  Neck: Neck supple.  Cardiovascular: Normal rate and regular rhythm.   Pulmonary/Chest: Effort normal and breath sounds normal. He has no wheezes. He has no rales.  Skin: Skin is warm and dry.  2 small erythematous spots to right inner thigh. No erythema migrans noted.          Assessment & Plan:  Dizziness:  Also with fatigue, headache, nausea, vomiting for the past 2 weeks intermittently. Does not appear acutely ill and respiratory exam unremarkable. Abnormal rash noted to right inner thigh. We'll obtain CBC, CMP, workup for Lyme disease. Also with history of borderline diabetes. Labs with evidence of hyperglycemia, will add on A1c which is pending. CBC without evidence of acute bacterial involvement. Prescription for Zofran sent to pharmacy for nausea. Discussed importance of proper hydration to prevent dehydration. Labs pending, will continue to monitor. Strict return precautions provided.

## 2015-10-31 NOTE — Progress Notes (Signed)
Pre visit review using our clinic review tool, if applicable. No additional management support is needed unless otherwise documented below in the visit note. 

## 2015-11-01 ENCOUNTER — Encounter: Payer: Self-pay | Admitting: Primary Care

## 2015-11-01 ENCOUNTER — Other Ambulatory Visit (INDEPENDENT_AMBULATORY_CARE_PROVIDER_SITE_OTHER): Payer: BC Managed Care – PPO

## 2015-11-01 DIAGNOSIS — R739 Hyperglycemia, unspecified: Secondary | ICD-10-CM

## 2015-11-01 LAB — LYME AB/WESTERN BLOT REFLEX: B burgdorferi Ab IgG+IgM: <0.9 Index (ref <0.90)

## 2015-11-01 LAB — HEMOGLOBIN A1C: Hgb A1c MFr Bld: 6.1 % (ref 4.6–6.5)

## 2015-11-03 ENCOUNTER — Other Ambulatory Visit: Payer: Self-pay | Admitting: *Deleted

## 2015-11-03 MED ORDER — CARVEDILOL 3.125 MG PO TABS: ORAL_TABLET | ORAL | Status: DC

## 2015-11-03 NOTE — Telephone Encounter (Signed)
Requested Prescriptions   Signed Prescriptions Disp Refills  . carvedilol (COREG) 3.125 MG tablet 60 tablet 3    Sig: TAKE 1 TABLET (3.125 MG TOTAL) BY MOUTH 2 (TWO) TIMES DAILY WITH A MEAL.    Authorizing Provider: Minna Merritts    Ordering User: Britt Bottom

## 2015-11-11 ENCOUNTER — Other Ambulatory Visit: Payer: Self-pay

## 2015-11-11 MED ORDER — LOSARTAN POTASSIUM 50 MG PO TABS: ORAL_TABLET | ORAL | Status: DC

## 2015-11-11 NOTE — Telephone Encounter (Signed)
Refill Losartan 50 mg

## 2015-11-14 ENCOUNTER — Other Ambulatory Visit: Payer: Self-pay | Admitting: *Deleted

## 2015-11-14 MED ORDER — LOSARTAN POTASSIUM 50 MG PO TABS: ORAL_TABLET | ORAL | Status: DC

## 2015-11-14 NOTE — Telephone Encounter (Signed)
Requested Prescriptions   Signed Prescriptions Disp Refills  . losartan (COZAAR) 50 MG tablet 90 tablet 3    Sig: TAKE 1 TABLET (50 MG TOTAL) BY MOUTH DAILY.    Authorizing Provider: Minna Merritts    Ordering User: Britt Bottom

## 2016-01-10 ENCOUNTER — Other Ambulatory Visit: Payer: Self-pay | Admitting: Primary Care

## 2016-01-10 DIAGNOSIS — K219 Gastro-esophageal reflux disease without esophagitis: Secondary | ICD-10-CM

## 2016-02-05 ENCOUNTER — Other Ambulatory Visit: Payer: Self-pay | Admitting: Cardiovascular Disease

## 2016-03-02 ENCOUNTER — Ambulatory Visit (INDEPENDENT_AMBULATORY_CARE_PROVIDER_SITE_OTHER): Payer: BC Managed Care – PPO

## 2016-03-02 DIAGNOSIS — Z23 Encounter for immunization: Secondary | ICD-10-CM | POA: Diagnosis not present

## 2016-06-24 ENCOUNTER — Other Ambulatory Visit: Payer: Self-pay | Admitting: Primary Care

## 2016-06-24 DIAGNOSIS — K219 Gastro-esophageal reflux disease without esophagitis: Secondary | ICD-10-CM

## 2016-08-23 ENCOUNTER — Other Ambulatory Visit: Payer: Self-pay | Admitting: Cardiovascular Disease

## 2016-10-09 ENCOUNTER — Encounter: Payer: Self-pay | Admitting: Physician Assistant

## 2016-10-09 ENCOUNTER — Ambulatory Visit (INDEPENDENT_AMBULATORY_CARE_PROVIDER_SITE_OTHER): Payer: BC Managed Care – PPO | Admitting: Physician Assistant

## 2016-10-09 VITALS — BP 138/80 | HR 73 | Temp 98.3°F | Ht 68.0 in | Wt 199.0 lb

## 2016-10-09 DIAGNOSIS — H6121 Impacted cerumen, right ear: Secondary | ICD-10-CM | POA: Diagnosis not present

## 2016-10-09 DIAGNOSIS — H6993 Unspecified Eustachian tube disorder, bilateral: Secondary | ICD-10-CM | POA: Diagnosis not present

## 2016-10-09 NOTE — Progress Notes (Signed)
Alec Smith is a 61 y.o. male here for pain right ear.  I acted as a Education administrator for [employer], PA-C Anselmo Pickler, LPN  History of Present Illness:   Chief Complaint  Patient presents with  . Right ear pain  . Allergies    Seasonal    Otalgia   There is pain in the right ear. Chronicity: Pt states right ear has been blocked off and on x 1 week and now having pain. The current episode started today. The problem occurs constantly. The problem has been gradually worsening. There has been no fever. The pain is at a severity of 7/10. The pain is moderate. Pertinent negatives include no abdominal pain, coughing, diarrhea, ear discharge or headaches. Associated symptoms comments: Right ear stopped up and hears voice echoing when talking.Marland Kitchen He has tried acetaminophen (and also has tried Zyrtec) for the symptoms. The treatment provided no relief.    PMHx, SurgHx, SocialHx, Medications, and Allergies were reviewed in the Visit Navigator and updated as appropriate.  Current Medications:   Current Outpatient Prescriptions:  .  aspirin 325 MG tablet, Take 125 mg by mouth daily. , Disp: , Rfl:  .  atorvastatin (LIPITOR) 40 MG tablet, TAKE 1 TABLET (40 MG TOTAL) BY MOUTH DAILY., Disp: 90 tablet, Rfl: 3 .  carvedilol (COREG) 3.125 MG tablet, TAKE 1 TABLET (3.125 MG TOTAL) BY MOUTH 2 (TWO) TIMES DAILY WITH A MEAL., Disp: 180 tablet, Rfl: 3 .  cetirizine (ZYRTEC) 10 MG tablet, Take 10 mg by mouth daily.  , Disp: , Rfl:  .  clopidogrel (PLAVIX) 75 MG tablet, TAKE 1 TABLET BY MOUTH EVERY DAY, Disp: 90 tablet, Rfl: 3 .  KRILL OIL PO, Take 300 mg by mouth daily. , Disp: , Rfl:  .  losartan (COZAAR) 50 MG tablet, TAKE 1 TABLET (50 MG TOTAL) BY MOUTH DAILY., Disp: 90 tablet, Rfl: 3 .  Multiple Vitamin (MULTIVITAMIN WITH MINERALS) TABS tablet, Take 1 tablet by mouth daily., Disp: , Rfl:  .  pantoprazole (PROTONIX) 40 MG tablet, TAKE 1 TABLET (40 MG TOTAL) BY MOUTH DAILY., Disp: 90 tablet, Rfl: 1    Review of Systems:   Review of Systems  Constitutional: Negative for chills, fever, malaise/fatigue and weight loss.  HENT: Positive for ear pain. Negative for ear discharge and sinus pain.   Eyes: Negative for blurred vision.  Respiratory: Negative for cough, sputum production and shortness of breath.   Gastrointestinal: Negative for abdominal pain and diarrhea.  Neurological: Negative for headaches.    Vitals:   Vitals:   10/09/16 1522  BP: 138/80  Pulse: 73  Temp: 98.3 F (36.8 C)  TempSrc: Oral  SpO2: 96%  Weight: 199 lb (90.3 kg)  Height: 5\' 8"  (1.727 m)     Body mass index is 30.26 kg/m.  Physical Exam:   Physical Exam  Constitutional: He appears well-developed. He is cooperative.  Non-toxic appearance. He does not have a sickly appearance. He does not appear ill. No distress.  HENT:  Head: Normocephalic and atraumatic.  Right Ear: External ear and ear canal normal.  Left Ear: Tympanic membrane, external ear and ear canal normal. Tympanic membrane is not erythematous, not retracted and not bulging.  Nose: Nose normal. Right sinus exhibits no maxillary sinus tenderness and no frontal sinus tenderness. Left sinus exhibits no maxillary sinus tenderness and no frontal sinus tenderness.  Mouth/Throat: Uvula is midline. No posterior oropharyngeal edema or posterior oropharyngeal erythema.  R ear with cerumen impaction; after ear lavage,  able to visualize small portion of TM -- no evidence of erythema  Eyes: Conjunctivae and lids are normal.  Neck: Trachea normal.  Cardiovascular: Normal rate, regular rhythm, S1 normal, S2 normal and normal heart sounds.   Pulmonary/Chest: Effort normal and breath sounds normal. He has no decreased breath sounds. He has no wheezes. He has no rhonchi. He has no rales.  Lymphadenopathy:    He has no cervical adenopathy.  Neurological: He is alert.  Skin: Skin is warm, dry and intact.  Psychiatric: He has a normal mood and affect. His  speech is normal and behavior is normal.  Nursing note and vitals reviewed.  Procedure: Cerumen Disimpaction Gentle ear lavage performed on the right by LPN. There were no complications and following the disimpaction the tympanic membrane was visible on the right. Tympanic membranes are intact following the procedure.  Auditory canals did not appear inflamed. The patient reported relief of symptoms after removal of cerumen.   Assessment and Plan:    Alec Smith was seen today for right ear pain and allergies.  Diagnoses and all orders for this visit:  Impacted cerumen of right ear -     Ear cerumen removal  Eustachian tube disorder, bilateral   Tolerated ear lavage well. Also recommended use of Debrox for a few days to help with remaining cerumen. I also recommended using flonase for a few days to help with his symptoms. Continue daily antihistamine. Follow-up with [place] or PCP if no improvement of symptoms or changes/concerns.  . Reviewed expectations re: course of current medical issues. . Discussed self-management of symptoms. . Outlined signs and symptoms indicating need for more acute intervention. . Patient verbalized understanding and all questions were answered. . See orders for this visit as documented in the electronic medical record. . Patient received an After-Visit Summary.  CMA or LPN served as scribe during this visit. History, Physical, and Plan performed by medical provider. Documentation and orders reviewed and attested to.  Inda Coke, PA-C

## 2016-10-09 NOTE — Patient Instructions (Signed)
It was great meeting you!  Use debrox drops to help soften your wax.  Tilt head sideways; place 5 to 10 drops into ear; tip of applicator should not enter ear canal; keep drops in ear for several minutes by keeping head tilted or placing cotton in the ear; use twice daily for up to four days if needed     Use flonase daily to help with your symptoms.  Follow-up with [place] if your symptoms change in anyway or if there are any other concerns.   Eustachian Tube Dysfunction The eustachian tube connects the middle ear to the back of the nose. It regulates air pressure in the middle ear by allowing air to move between the ear and nose. It also helps to drain fluid from the middle ear space. When the eustachian tube does not function properly, air pressure, fluid, or both can build up in the middle ear. Eustachian tube dysfunction can affect one or both ears. What are the causes? This condition happens when the eustachian tube becomes blocked or cannot open normally. This may result from:  Ear infections.  Colds and other upper respiratory infections.  Allergies.  Irritation, such as from cigarette smoke or acid from the stomach coming up into the esophagus (gastroesophageal reflux).  Sudden changes in air pressure, such as from descending in an airplane.  Abnormal growths in the nose or throat, such as nasal polyps, tumors, or enlarged tissue at the back of the throat (adenoids). What increases the risk? This condition may be more likely to develop in people who smoke and people who are overweight. Eustachian tube dysfunction may also be more likely to develop in children, especially children who have:  Certain birth defects of the mouth, such as cleft palate.  Large tonsils and adenoids. What are the signs or symptoms? Symptoms of this condition may include:  A feeling of fullness in the ear.  Ear pain.  Clicking or popping noises in the ear.  Ringing in the ear.  Hearing  loss.  Loss of balance. Symptoms may get worse when the air pressure around you changes, such as when you travel to an area of high elevation or fly on an airplane. How is this diagnosed? This condition may be diagnosed based on:  Your symptoms.  A physical exam of your ear, nose, and throat.  Tests, such as those that measure:  The movement of your eardrum (tympanogram).  Your hearing (audiometry). How is this treated? Treatment depends on the cause and severity of your condition. If your symptoms are mild, you may be able to relieve your symptoms by moving air into ("popping") your ears. If you have symptoms of fluid in your ears, treatment may include:  Decongestants.  Antihistamines.  Nasal sprays or ear drops that contain medicines that reduce swelling (steroids). In some cases, you may need to have a procedure to drain the fluid in your eardrum (myringotomy). In this procedure, a small tube is placed in the eardrum to:  Drain the fluid.  Restore the air in the middle ear space. Follow these instructions at home:  Take over-the-counter and prescription medicines only as told by your health care provider.  Use techniques to help pop your ears as recommended by your health care provider. These may include:  Chewing gum.  Yawning.  Frequent, forceful swallowing.  Closing your mouth, holding your nose closed, and gently blowing as if you are trying to blow air out of your nose.  Do not do any of  the following until your health care provider approves:  Travel to high altitudes.  Fly in airplanes.  Work in a Pension scheme manager or room.  Scuba dive.  Keep your ears dry. Dry your ears completely after showering or bathing.  Do not smoke.  Keep all follow-up visits as told by your health care provider. This is important. Contact a health care provider if:  Your symptoms do not go away after treatment.  Your symptoms come back after treatment.  You are unable  to pop your ears.  You have:  A fever.  Pain in your ear.  Pain in your head or neck.  Fluid draining from your ear.  Your hearing suddenly changes.  You become very dizzy.  You lose your balance. This information is not intended to replace advice given to you by your health care provider. Make sure you discuss any questions you have with your health care provider. Document Released: 05/27/2015 Document Revised: 10/06/2015 Document Reviewed: 05/19/2014 Elsevier Interactive Patient Education  2017 [employer].

## 2016-10-10 ENCOUNTER — Ambulatory Visit: Payer: BC Managed Care – PPO | Admitting: Internal Medicine

## 2016-10-14 NOTE — Progress Notes (Signed)
Patient ID: Alec Smith, male   DOB: 12-22-1955, 61 y.o.   MRN: [id number] Cardiology Office Note  Date:  10/15/2016   ID:  Alec Smith, DOB 28-Jun-1955, MRN [id number]  PCP:  Alec Koch, NP   Chief Complaint  Patient presents with  . other    12 month follow up. Meds reviewed by the pt. verbally. "doing well."     HPI:   Alec Smith is a pleasant 61 year old gentleman with history of  stress test October 21, 2008 showed anterior ischemia  Cath showing coronary artery disease,  stent placed to the proximal to mid LAD for occluded vessel,  residual RCA disease estimated at 60% with intervention performed in 2010.  He presents for follow-up of his coronary artery disease  He reports he is doing well, no regular exercise program Weight is up 10 pounds from  last year Denies any symptoms concerning for angina He has a month off in July, will be active at that time Tolerating his medications without side effects Discussed diet with him, meat and potatoes  Previous lab work reviewed with him on today's visit No lipid panel in 2 years  EKG personally reviewed by myself on todays visit shows normal sinus rhythm with rate 69 bpm, no significant ST or T-wave changes  Other past medical history Prior lab workTotal cholesterol 117, LDL 67, triglycerides 71, normal LFTs in December 2013 Total cholesterol in 2014 was 142, LDL 69  PMH:   has a past medical history of CAD (coronary artery disease); GERD (gastroesophageal reflux disease); adenomatous polyp of colon (02/09/2015); Hyperlipidemia; and Hypertension.  PSH:    Past Surgical History:  Procedure Laterality Date  . APPENDECTOMY  1966  . CORONARY ANGIOPLASTY WITH STENT PLACEMENT  2010  . LUMBAR DISC SURGERY  2000   Lower back  . TONSILLECTOMY     age 61    Current Outpatient Prescriptions  Medication Sig Dispense Refill  . atorvastatin (LIPITOR) 40 MG tablet TAKE 1 TABLET (40 MG TOTAL) BY MOUTH DAILY. 90 tablet 3  .  carvedilol (COREG) 3.125 MG tablet TAKE 1 TABLET (3.125 MG TOTAL) BY MOUTH 2 (TWO) TIMES DAILY WITH A MEAL. 180 tablet 3  . cetirizine (ZYRTEC) 10 MG tablet Take 10 mg by mouth daily.      . clopidogrel (PLAVIX) 75 MG tablet TAKE 1 TABLET BY MOUTH EVERY DAY 90 tablet 3  . KRILL OIL PO Take 300 mg by mouth daily.     Marland Kitchen losartan (COZAAR) 50 MG tablet TAKE 1 TABLET (50 MG TOTAL) BY MOUTH DAILY. 90 tablet 3  . Multiple Vitamin (MULTIVITAMIN WITH MINERALS) TABS tablet Take 1 tablet by mouth daily.    . pantoprazole (PROTONIX) 40 MG tablet TAKE 1 TABLET (40 MG TOTAL) BY MOUTH DAILY. 90 tablet 1  . aspirin EC 81 MG tablet Take 1 tablet (81 mg total) by mouth daily. 90 tablet 3   No current facility-administered medications for this visit.     Allergies:   Patient has no known allergies.   Social History:  The patient  reports that he has never smoked. He has never used smokeless tobacco. He reports that he drinks alcohol. He reports that he does not use drugs.   Family History:   family history includes Aneurysm (age of onset: 49) in his mother; Heart failure in his father; Prostate cancer in his paternal grandfather; Vaginal cancer in his paternal grandmother.    Review of Systems: Review of Systems  Constitutional: Negative.   Respiratory: Negative.   Cardiovascular: Negative.   Gastrointestinal: Negative.   Musculoskeletal: Negative.   Neurological: Negative.   Psychiatric/Behavioral: Negative.   All other systems reviewed and are negative.    PHYSICAL EXAM: VS:  BP 132/80 (BP Location: Left Arm, Patient Position: Sitting, Cuff Size: Normal)   Pulse 69   Ht 5\' 9"  (1.753 m)   Wt 201 lb 12 oz (91.5 kg)   BMI 29.79 kg/m  , BMI Body mass index is 29.79 kg/m.  GEN: Well nourished, well developed, in no acute distress  HEENT: normal  Neck: no JVD, carotid bruits, or masses Cardiac: RRR; no murmurs, rubs, or gallops,no edema  Respiratory:  clear to auscultation bilaterally, normal  work of breathing GI: soft, nontender, nondistended, + BS MS: no deformity or atrophy  Skin: warm and dry, no rash Neuro:  Strength and sensation are intact Psych: euthymic mood, full affect    Recent Labs: 10/31/2015: ALT 33; BUN 26; Creatinine, Ser 1.06; Hemoglobin 16.9; Platelets 235.0; Potassium 4.6; Sodium 136    Lipid Panel Lab Results  Component Value Date   CHOL 133 11/05/2014   HDL 35.70 (L) 11/05/2014   LDLCALC 71 11/05/2014   TRIG 131.0 11/05/2014      Wt Readings from Last 3 Encounters:  10/15/16 201 lb 12 oz (91.5 kg)  10/09/16 199 lb (90.3 kg)  10/31/15 191 lb 6.4 oz (86.8 kg)      ASSESSMENT AND PLAN:   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 No further workup at this time. Continue current medication regimen.  Benign essential HTN -  Blood pressure is well controlled on today's visit. No changes made to the medications.  Hyperlipidemia Labs ordered today Diet discussed with him, lipid panel goals discussed with him  CAD S/P percutaneous coronary angioplasty/stents He would like to stay on plavix Discussed benefit and risk of staying on Plavix He will stay on low-dose aspirin  Borderline diabetes We have encouraged continued exercise, careful diet management in an effort to lose weight. Weight is up 10 pounds   Disposition:   F/U  12 months   Total encounter time more than 25 minutes  Greater than 50% was spent in counseling and coordination of care with the patient     Orders Placed This Encounter  Procedures  . EKG 12-Lead     Signed, Alec Smith, M.D., Ph.D. 10/15/2016  Alec Smith, Alec Smith

## 2016-10-15 ENCOUNTER — Encounter: Payer: Self-pay | Admitting: Cardiovascular Disease

## 2016-10-15 ENCOUNTER — Ambulatory Visit (INDEPENDENT_AMBULATORY_CARE_PROVIDER_SITE_OTHER): Payer: BC Managed Care – PPO | Admitting: Cardiovascular Disease

## 2016-10-15 VITALS — BP 132/80 | HR 69 | Ht 69.0 in | Wt 201.8 lb

## 2016-10-15 DIAGNOSIS — I25118 Atherosclerotic heart disease of native coronary artery with other forms of angina pectoris: Secondary | ICD-10-CM | POA: Diagnosis not present

## 2016-10-15 DIAGNOSIS — I10 Essential (primary) hypertension: Secondary | ICD-10-CM | POA: Diagnosis not present

## 2016-10-15 DIAGNOSIS — E782 Mixed hyperlipidemia: Secondary | ICD-10-CM | POA: Diagnosis not present

## 2016-10-15 DIAGNOSIS — R7303 Prediabetes: Secondary | ICD-10-CM

## 2016-10-15 NOTE — Patient Instructions (Signed)
Medication Instructions:   No medication changes made  Labwork:  We will check liver and lipids  Testing/Procedures:  No further testing at this time   I recommend watching educational videos on topics of interest to you at:       www.goemmi.com  Enter code: HEARTCARE    Follow-Up: It was a pleasure seeing you in the office today. Please call us if you have new issues that need to be addressed before your next appt.  [phone]  Your physician wants you to follow-up in: 12 months.  You will receive a reminder letter in the mail two months in advance. If you don't receive a letter, please call our office to schedule the follow-up appointment.  If you need a refill on your cardiac medications before your next appointment, please call your pharmacy.

## 2016-10-16 LAB — HEPATIC FUNCTION PANEL
ALT: 28 IU/L (ref 0–44)
AST: 20 IU/L (ref 0–40)
Albumin: 4.3 g/dL (ref 3.6–4.8)
Alkaline Phosphatase: 85 IU/L (ref 39–117)
Bilirubin Total: 0.4 mg/dL (ref 0.0–1.2)
Bilirubin, Direct: 0.11 mg/dL (ref 0.00–0.40)
Total Protein: 6.4 g/dL (ref 6.0–8.5)

## 2016-10-16 LAB — LIPID PANEL
CHOLESTEROL TOTAL: 112 mg/dL (ref 100–199)
Chol/HDL Ratio: 3.6 ratio (ref 0.0–5.0)
HDL: 31 mg/dL — ABNORMAL LOW (ref >39)
LDL Calculated: 55 mg/dL (ref 0–99)
TRIGLYCERIDES: 131 mg/dL (ref 0–149)
VLDL CHOLESTEROL CAL: 26 mg/dL (ref 5–40)

## 2016-12-18 ENCOUNTER — Other Ambulatory Visit: Payer: Self-pay | Admitting: Cardiovascular Disease

## 2016-12-19 ENCOUNTER — Other Ambulatory Visit: Payer: Self-pay | Admitting: Primary Care

## 2016-12-19 DIAGNOSIS — K219 Gastro-esophageal reflux disease without esophagitis: Secondary | ICD-10-CM

## 2017-02-06 ENCOUNTER — Other Ambulatory Visit: Payer: Self-pay | Admitting: Cardiovascular Disease

## 2017-02-23 ENCOUNTER — Other Ambulatory Visit: Payer: Self-pay | Admitting: Cardiovascular Disease

## 2017-04-15 ENCOUNTER — Telehealth: Payer: Self-pay | Admitting: Cardiovascular Disease

## 2017-04-15 NOTE — Telephone Encounter (Signed)
Acceptable risk for surgery No further testing needed Would stay on aspirin Okay to hold Plavix 5 days before procedure

## 2017-04-15 NOTE — Telephone Encounter (Signed)
   [facility] Pre-operative Risk Assessment    Request for surgical clearance:  1. What type of surgery is being performed? Prostate biopsy  2. When is this surgery scheduled? Not stated  3. Are there any medications that need to be held prior to surgery and how long?Plavix and aspirin  4. Practice name and name of physician performing surgery? [facility]  5. What is your office phone and fax number? [phone], fax [phone]  6. Anesthesia type (None, local, MAC, general) ? Not stated   Lucienne Minks 04/15/2017, 12:02 PM  _________________________________________________________________   (provider comments below)

## 2017-04-16 NOTE — Telephone Encounter (Signed)
Clearance routed to number provided.  

## 2017-05-09 ENCOUNTER — Ambulatory Visit: Payer: BC Managed Care – PPO | Admitting: Primary Care

## 2017-05-09 ENCOUNTER — Encounter: Payer: Self-pay | Admitting: Primary Care

## 2017-05-09 VITALS — BP 114/70 | HR 71 | Temp 98.1°F | Ht 69.0 in | Wt 202.8 lb

## 2017-05-09 DIAGNOSIS — J209 Acute bronchitis, unspecified: Secondary | ICD-10-CM

## 2017-05-09 MED ORDER — GUAIFENESIN-CODEINE 100-10 MG/5ML PO SYRP: 5.0000 mL | ORAL_SOLUTION | Freq: Three times a day (TID) | ORAL | 0 refills | Status: DC | PRN

## 2017-05-09 MED ORDER — AZITHROMYCIN 250 MG PO TABS: ORAL_TABLET | ORAL | 0 refills | Status: DC

## 2017-05-09 NOTE — Patient Instructions (Signed)
Start Azithromycin antibiotics for infection. Take 2 tablets by mouth today, then 1 tablet daily for 4 additional days.  You may take the cough suppressant every 8 hours as needed for cough and rest. Caution this medication contains codeine and will make you feel drowsy.  Ensure you are staying hydrated and rest.  It was a pleasure to see you today!

## 2017-05-09 NOTE — Progress Notes (Signed)
Subjective:    Patient ID: VERONICA GUERRANT, male    DOB: 28-Mar-1956, 61 y.o.   MRN: [id number]  HPI  Mr. Vivona is a 61 year old male with a history of GERD, prediabetes, CAD who presents today with a chief complaint of cough.   He also reports nasal congestion, chest congestion, low grade fever. His cough is productive with yellow sputum. His symptoms began over 2 weeks ago. He's been taking Mucinex and Delsym with some improvement, but is overall not getting better.  He denies sick contacts.  Review of Systems  Constitutional: Positive for fatigue and fever. Negative for chills.  HENT: Positive for congestion. Negative for ear pain and sinus pressure.   Respiratory: Positive for cough. Negative for shortness of breath.        Past Medical History:  Diagnosis Date  . CAD (coronary artery disease)   . GERD (gastroesophageal reflux disease)   . Hx of adenomatous polyp of colon 02/09/2015  . Hyperlipidemia   . Hypertension      Social History   Socioeconomic History  . Marital status: Married    Spouse name: Not on file  . Number of children: 2  . Years of education: Not on file  . Highest education level: Not on file  Social Needs  . Financial resource strain: Not on file  . Food insecurity - worry: Not on file  . Food insecurity - inability: Not on file  . Transportation needs - medical: Not on file  . Transportation needs - non-medical: Not on file  Occupational History  . Occupation: school Technical brewer: [employer]  Tobacco Use  . Smoking status: Never Smoker  . Smokeless tobacco: Never Used  Substance and Sexual Activity  . Alcohol use: Yes    Alcohol/week: 0.0 oz    Comment: once or twice a year  . Drug use: No  . Sexual activity: Not on file  Other Topics Concern  . Not on file  Social History Narrative   Married.   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   01/21/2015     Past Surgical History:  Procedure Laterality Date  . APPENDECTOMY  1966  . CORONARY ANGIOPLASTY WITH STENT PLACEMENT  2010  . LUMBAR DISC SURGERY  2000   Lower back  . TONSILLECTOMY     age 54    Family History  Problem Relation Age of Onset  . Aneurysm Mother 7       AAA  . Heart failure Father   . Prostate cancer Paternal Grandfather   . Vaginal cancer Paternal Grandmother        spread to lungs  . Colon cancer Neg Hx   . Esophageal cancer Neg Hx   . Stomach cancer Neg Hx   . Rectal cancer Neg Hx     No Known Allergies  Current Outpatient Medications on File Prior to Visit  Medication Sig Dispense Refill  . aspirin EC 81 MG tablet Take 1 tablet (81 mg total) by mouth daily. 90 tablet 3  . atorvastatin (LIPITOR) 40 MG tablet TAKE 1 TABLET (40 MG TOTAL) BY MOUTH DAILY. 90 tablet 3  . carvedilol (COREG) 3.125 MG tablet TAKE 1 TABLET (3.125 MG TOTAL) BY MOUTH 2 (TWO) TIMES DAILY WITH A MEAL. 180 tablet 3  . cetirizine (ZYRTEC) 10 MG tablet Take 10 mg by mouth daily.      Marland Kitchen  clopidogrel (PLAVIX) 75 MG tablet TAKE 1 TABLET BY MOUTH EVERY DAY 90 tablet 3  . KRILL OIL PO Take 300 mg by mouth daily.     Marland Kitchen losartan (COZAAR) 50 MG tablet TAKE 1 TABLET (50 MG TOTAL) BY MOUTH DAILY. 90 tablet 3  . Multiple Vitamin (MULTIVITAMIN WITH MINERALS) TABS tablet Take 1 tablet by mouth daily.    . pantoprazole (PROTONIX) 40 MG tablet TAKE 1 TABLET (40 MG TOTAL) BY MOUTH DAILY. 90 tablet 1   No current facility-administered medications on file prior to visit.     BP 114/70   Pulse 71   Temp 98.1 F (36.7 C) (Oral)   Ht 5\' 9"  (1.753 m)   Wt 202 lb 12.8 oz (92 kg)   SpO2 97%   BMI 29.95 kg/m    Objective:   Physical Exam  Constitutional: He appears well-nourished.  HENT:  Right Ear: Tympanic membrane and ear canal normal.  Left Ear: Tympanic membrane and ear canal normal.  Nose: Mucosal edema present. Right sinus exhibits no maxillary sinus tenderness and no frontal sinus  tenderness. Left sinus exhibits no maxillary sinus tenderness and no frontal sinus tenderness.  Mouth/Throat: Oropharynx is clear and moist.  Eyes: Conjunctivae are normal.  Neck: Neck supple.  Cardiovascular: Normal rate and regular rhythm.  Pulmonary/Chest: Effort normal. He has no wheezes. He has rhonchi in the right lower field. He has no rales.  Skin: Skin is warm and dry.          Assessment & Plan:  Acute Bronchitis:  Cough, congestion, fatigue x 2+ weeks. Temporary improvement with OTC treatment. Exam today with rhonchi to RLL. Vitals stable. Given duration of symptoms coupled with examination, will treat. Rx for Zpak sent to pharmacy. Rx for Cheratussin printed, drowsiness precautions provided. Fluids, rest, follow up PRN.  Sheral Flow, NP

## 2017-06-17 ENCOUNTER — Other Ambulatory Visit: Payer: Self-pay | Admitting: Primary Care

## 2017-06-17 DIAGNOSIS — K219 Gastro-esophageal reflux disease without esophagitis: Secondary | ICD-10-CM

## 2017-09-13 ENCOUNTER — Other Ambulatory Visit: Payer: Self-pay | Admitting: Cardiovascular Disease

## 2017-09-13 ENCOUNTER — Other Ambulatory Visit: Payer: Self-pay | Admitting: Primary Care

## 2017-09-13 DIAGNOSIS — K219 Gastro-esophageal reflux disease without esophagitis: Secondary | ICD-10-CM

## 2017-10-12 ENCOUNTER — Other Ambulatory Visit: Payer: Self-pay | Admitting: Primary Care

## 2017-10-12 DIAGNOSIS — K219 Gastro-esophageal reflux disease without esophagitis: Secondary | ICD-10-CM

## 2017-10-14 ENCOUNTER — Other Ambulatory Visit: Payer: Self-pay | Admitting: Primary Care

## 2017-10-14 DIAGNOSIS — K219 Gastro-esophageal reflux disease without esophagitis: Secondary | ICD-10-CM

## 2017-10-14 MED ORDER — PANTOPRAZOLE SODIUM 40 MG PO TBEC: 40.0000 mg | DELAYED_RELEASE_TABLET | Freq: Every day | ORAL | 0 refills | Status: DC

## 2017-10-14 NOTE — Telephone Encounter (Signed)
Ok to refill? MyChart refill request for pantoprazole (PROTONIX) 40 MG tablet  Last prescribed on 09/13/2017.  Last seen on 05/09/2017

## 2017-10-14 NOTE — Telephone Encounter (Signed)
He is overdue for follow up or CPE, either is fine. Please schedule at his earliest convenience. Will send 30 day supply until he can be seen.

## 2017-10-28 ENCOUNTER — Ambulatory Visit: Payer: BC Managed Care – PPO | Admitting: Cardiovascular Disease

## 2017-10-28 ENCOUNTER — Encounter: Payer: Self-pay | Admitting: Cardiovascular Disease

## 2017-10-28 VITALS — BP 120/78 | HR 68 | Ht 68.0 in | Wt 198.5 lb

## 2017-10-28 DIAGNOSIS — I10 Essential (primary) hypertension: Secondary | ICD-10-CM

## 2017-10-28 DIAGNOSIS — E782 Mixed hyperlipidemia: Secondary | ICD-10-CM

## 2017-10-28 DIAGNOSIS — I25118 Atherosclerotic heart disease of native coronary artery with other forms of angina pectoris: Secondary | ICD-10-CM | POA: Diagnosis not present

## 2017-10-28 DIAGNOSIS — Z9861 Coronary angioplasty status: Secondary | ICD-10-CM | POA: Diagnosis not present

## 2017-10-28 DIAGNOSIS — I2510 Atherosclerotic heart disease of native coronary artery without angina pectoris: Secondary | ICD-10-CM

## 2017-10-28 MED ORDER — ATORVASTATIN CALCIUM 40 MG PO TABS: 40.0000 mg | ORAL_TABLET | Freq: Every day | ORAL | 3 refills | Status: DC

## 2017-10-28 NOTE — Patient Instructions (Signed)

## 2017-10-28 NOTE — Progress Notes (Signed)
Patient ID: Alec Smith, male   DOB: [birth year], 62 y.o.   MRN: [id number] Cardiology Office Note  Date:  10/28/2017   ID:  Alec Smith, DOB 1955/09/11, MRN [id number]  PCP:  [name], NP   Chief Complaint  Patient presents with  . OTHER    12 month f/u no complaints today. Meds reviewed verbally with pt.    HPI:  [name] is a pleasant 62 year old gentleman with history of  stress test October 21, 2008 showed anterior ischemia  Cath showing coronary artery disease,  stent placed to the proximal to mid LAD for occluded vessel,  residual RCA disease estimated at 60% with intervention performed in 2010.  He presents for follow-up of his coronary artery disease  In follow-up reports he is doing well No symptoms concerning for angina No regular exercise program Hope to retire in 2 years  Tolerating his medications without side effects No recent lab work available Cholesterol at goal last year  EKG personally reviewed by myself on todays visit shows normal sinus rhythm with rate 68 bpm, no significant ST or T-wave changes  Other past medical history Prior lab workTotal cholesterol 117, LDL 67, triglycerides 71, normal LFTs in December 2013 Total cholesterol in 2014 was 142, LDL 1  PMH:   has a past medical history of CAD (coronary artery disease), GERD (gastroesophageal reflux disease), adenomatous polyp of colon (02/09/2015), Hyperlipidemia, and Hypertension.  PSH:    Past Surgical History:  Procedure Laterality Date  . APPENDECTOMY  1966  . CORONARY ANGIOPLASTY WITH STENT PLACEMENT  2010  . LUMBAR DISC SURGERY  2000   Lower back  . PROSTATE BIOPSY    . TONSILLECTOMY     age 74    Current Outpatient Medications  Medication Sig Dispense Refill  . aspirin EC 81 MG tablet Take 1 tablet (81 mg total) by mouth daily. 90 tablet 3  . atorvastatin (LIPITOR) 40 MG tablet TAKE 1 TABLET (40 MG TOTAL) BY MOUTH DAILY. 90 tablet 0  . carvedilol (COREG) 3.125 MG  tablet TAKE 1 TABLET (3.125 MG TOTAL) BY MOUTH 2 (TWO) TIMES DAILY WITH A MEAL. 180 tablet 3  . cetirizine (ZYRTEC) 10 MG tablet Take 10 mg by mouth daily.      . clopidogrel (PLAVIX) 75 MG tablet TAKE 1 TABLET BY MOUTH EVERY DAY 90 tablet 3  . KRILL OIL PO Take 300 mg by mouth daily.     Marland Kitchen losartan (COZAAR) 50 MG tablet TAKE 1 TABLET (50 MG TOTAL) BY MOUTH DAILY. 90 tablet 3  . Multiple Vitamin (MULTIVITAMIN WITH MINERALS) TABS tablet Take 1 tablet by mouth daily.    . pantoprazole (PROTONIX) 40 MG tablet Take 1 tablet (40 mg total) by mouth daily. NEED APPOINTMENT FOR ANY MORE REFILLS 30 tablet 0   No current facility-administered medications for this visit.     Allergies:   Patient has no known allergies.   Social History:  The patient  reports that he has never smoked. He has never used smokeless tobacco. He reports that he drinks alcohol. He reports that he does not use drugs.   Family History:   family history includes Aneurysm (age of onset: 30) in his mother; Heart failure in his father; Prostate cancer in his paternal grandfather; Vaginal cancer in his paternal grandmother.    Review of Systems: Review of Systems  Constitutional: Negative.   Respiratory: Negative.   Cardiovascular: Negative.   Gastrointestinal: Negative.   Musculoskeletal: Negative.  Neurological: Negative.   Psychiatric/Behavioral: Negative.   All other systems reviewed and are negative.    PHYSICAL EXAM: VS:  BP 120/78 (BP Location: Left Arm, Patient Position: Sitting, Cuff Size: Normal)   Pulse 68   Ht 5\' 8"  (1.727 m)   Wt 198 lb 8 oz (90 kg)   BMI 30.18 kg/m  , BMI Body mass index is 30.18 kg/m.  Constitutional:  oriented to person, place, and time. No distress.  HENT:  Head: Normocephalic and atraumatic.  Eyes:  no discharge. No scleral icterus.  Neck: Normal range of motion. Neck supple. No JVD present.  Cardiovascular: Normal rate, regular rhythm, normal heart sounds and intact distal  pulses. Exam reveals no gallop and no friction rub. No edema No murmur heard. Pulmonary/Chest: Effort normal and breath sounds normal. No stridor. No respiratory distress.  no wheezes.  no rales.  no tenderness.  Abdominal: Soft.  no distension.  no tenderness.  Musculoskeletal: Normal range of motion.  no  tenderness or deformity.  Neurological:  normal muscle tone. Coordination normal. No atrophy Skin: Skin is warm and dry. No rash noted. not diaphoretic.  Psychiatric:  normal mood and affect. behavior is normal. Thought content normal.     Recent Labs: No results found for requested labs within last 8760 hours.    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 No further workup at this time. Continue current medication regimen.stable  Benign essential HTN -  Blood pressure is well controlled on today's visit. No changes made to the medications. stable  Hyperlipidemia He reports that he will contact primary care for routine annual lab work We did give him a lab slip for lipids and LFTs to do at his convenience  CAD S/P percutaneous coronary angioplasty/stents Long discussion concerning stay on aspirin and Plavix Suggested that both are not needed at this time He will likely drop the aspirin and stay on Plavix  Borderline diabetes Recommended dietary changes, exercise program   Disposition:   F/U  12 months   Total encounter time more than 15 minutes  Greater than 50% was spent in counseling and coordination of care with the patient     Orders  Placed This Encounter  Procedures  . EKG 12-Lead     Signed, Esmond Plants, M.D., Ph.D. 10/28/2017  [place], [place]

## 2017-10-31 ENCOUNTER — Other Ambulatory Visit (INDEPENDENT_AMBULATORY_CARE_PROVIDER_SITE_OTHER): Payer: BC Managed Care – PPO

## 2017-10-31 ENCOUNTER — Other Ambulatory Visit: Payer: Self-pay | Admitting: *Deleted

## 2017-10-31 DIAGNOSIS — E782 Mixed hyperlipidemia: Secondary | ICD-10-CM

## 2017-10-31 DIAGNOSIS — I25118 Atherosclerotic heart disease of native coronary artery with other forms of angina pectoris: Secondary | ICD-10-CM

## 2017-10-31 DIAGNOSIS — I10 Essential (primary) hypertension: Secondary | ICD-10-CM

## 2017-10-31 DIAGNOSIS — I2510 Atherosclerotic heart disease of native coronary artery without angina pectoris: Secondary | ICD-10-CM

## 2017-10-31 DIAGNOSIS — Z79899 Other long term (current) drug therapy: Secondary | ICD-10-CM

## 2017-10-31 DIAGNOSIS — Z9861 Coronary angioplasty status: Secondary | ICD-10-CM

## 2017-11-01 LAB — HEPATIC FUNCTION PANEL
ALK PHOS: 86 IU/L (ref 39–117)
ALT: 32 IU/L (ref 0–44)
AST: 25 IU/L (ref 0–40)
Albumin: 4.4 g/dL (ref 3.6–4.8)
Bilirubin Total: 0.4 mg/dL (ref 0.0–1.2)
Bilirubin, Direct: 0.11 mg/dL (ref 0.00–0.40)
TOTAL PROTEIN: 6.8 g/dL (ref 6.0–8.5)

## 2017-11-01 LAB — LIPID PANEL
CHOL/HDL RATIO: 3.7 ratio (ref 0.0–5.0)
Cholesterol, Total: 117 mg/dL (ref 100–199)
HDL: 32 mg/dL — AB (ref >39)
LDL CALC: 65 mg/dL (ref 0–99)
Triglycerides: 100 mg/dL (ref 0–149)
VLDL CHOLESTEROL CAL: 20 mg/dL (ref 5–40)

## 2017-11-04 ENCOUNTER — Other Ambulatory Visit: Payer: Self-pay | Admitting: *Deleted

## 2017-11-04 DIAGNOSIS — Z79899 Other long term (current) drug therapy: Secondary | ICD-10-CM

## 2017-11-04 DIAGNOSIS — E782 Mixed hyperlipidemia: Secondary | ICD-10-CM

## 2017-11-11 ENCOUNTER — Other Ambulatory Visit: Payer: Self-pay | Admitting: Primary Care

## 2017-11-11 DIAGNOSIS — K219 Gastro-esophageal reflux disease without esophagitis: Secondary | ICD-10-CM

## 2017-11-20 ENCOUNTER — Other Ambulatory Visit: Payer: Self-pay | Admitting: Primary Care

## 2017-11-20 DIAGNOSIS — K219 Gastro-esophageal reflux disease without esophagitis: Secondary | ICD-10-CM

## 2017-11-21 NOTE — Telephone Encounter (Signed)
Electronically refill request for pantoprazole (PROTONIX) 40 MG tablet  Last prescribed on 10/14/2017  Last office visit on 05/09/2017 for acute

## 2017-11-21 NOTE — Telephone Encounter (Signed)
We will have to decline unless he schedules an appointment for CPE soon. Approve 30 day supply if he schedules an appointment, otherwise please decline.

## 2017-11-22 NOTE — Telephone Encounter (Signed)
Message left for patient to return my call.  

## 2017-11-26 NOTE — Telephone Encounter (Signed)
Noted, refill sent to pharmacy. 

## 2017-11-26 NOTE — Telephone Encounter (Signed)
Spoken to patient and schedule CPE on 11/29/2017.

## 2017-11-29 ENCOUNTER — Encounter: Payer: Self-pay | Admitting: Primary Care

## 2017-11-29 ENCOUNTER — Ambulatory Visit (INDEPENDENT_AMBULATORY_CARE_PROVIDER_SITE_OTHER): Payer: BC Managed Care – PPO | Admitting: Primary Care

## 2017-11-29 VITALS — BP 114/66 | HR 58 | Temp 98.1°F | Ht 68.0 in | Wt 197.5 lb

## 2017-11-29 DIAGNOSIS — I25118 Atherosclerotic heart disease of native coronary artery with other forms of angina pectoris: Secondary | ICD-10-CM | POA: Diagnosis not present

## 2017-11-29 DIAGNOSIS — R7303 Prediabetes: Secondary | ICD-10-CM | POA: Diagnosis not present

## 2017-11-29 DIAGNOSIS — Z23 Encounter for immunization: Secondary | ICD-10-CM | POA: Diagnosis not present

## 2017-11-29 DIAGNOSIS — Z1159 Encounter for screening for other viral diseases: Secondary | ICD-10-CM | POA: Diagnosis not present

## 2017-11-29 DIAGNOSIS — Z0000 Encounter for general adult medical examination without abnormal findings: Secondary | ICD-10-CM

## 2017-11-29 DIAGNOSIS — I10 Essential (primary) hypertension: Secondary | ICD-10-CM

## 2017-11-29 DIAGNOSIS — K219 Gastro-esophageal reflux disease without esophagitis: Secondary | ICD-10-CM

## 2017-11-29 DIAGNOSIS — E782 Mixed hyperlipidemia: Secondary | ICD-10-CM | POA: Diagnosis not present

## 2017-11-29 LAB — BASIC METABOLIC PANEL
BUN: 24 mg/dL — ABNORMAL HIGH (ref 6–23)
CO2: 30 mEq/L (ref 19–32)
Calcium: 9.4 mg/dL (ref 8.4–10.5)
Chloride: 104 mEq/L (ref 96–112)
Creatinine, Ser: 1.15 mg/dL (ref 0.40–1.50)
GFR: 68.3 mL/min (ref >60.00)
Glucose, Bld: 117 mg/dL — ABNORMAL HIGH (ref 70–99)
POTASSIUM: 4.5 meq/L (ref 3.5–5.1)
SODIUM: 140 meq/L (ref 135–145)

## 2017-11-29 LAB — HEMOGLOBIN A1C: HEMOGLOBIN A1C: 6.4 % (ref 4.6–6.5)

## 2017-11-29 NOTE — Assessment & Plan Note (Signed)
Doing well on pantoprazole 40 mg daily, continue same. Discussed triggers.

## 2017-11-29 NOTE — Assessment & Plan Note (Signed)
Managed on statin and Plavix, continue same. Discussed the importance of a healthy diet and regular exercise in order for weight loss, and to reduce the risk of any potential medical problems.

## 2017-11-29 NOTE — Assessment & Plan Note (Signed)
Td due provided today. Other immunizations UTD. PSA UTD, followed by Urology. Colonoscopy UTD. Discussed the importance of a healthy diet and regular exercise in order for weight loss, and to reduce the risk of any potential medical problems. Exam unremarkable. Labs pending. Follow up in 1 year for CPE.

## 2017-11-29 NOTE — Patient Instructions (Signed)
You were provided with a tetanus vaccination which will cover you for 10 years.  Continue exercising. You should be getting 150 minutes of moderate intensity exercise weekly.  Increase vegetables, fruit, whole grains, lean protein.  Stop by the lab prior to leaving today. I will notify you of your results once received.   Follow up in 1 year for your annual exam or sooner if needed.  It was a pleasure to see you today!   Preventive Care 40-64 Years, Male Preventive care refers to lifestyle choices and visits with your health care provider that can promote health and wellness. What does preventive care include?  A yearly physical exam. This is also called an annual well check.  Dental exams once or twice a year.  Routine eye exams. Ask your health care provider how often you should have your eyes checked.  Personal lifestyle choices, including: ? Daily care of your teeth and gums. ? Regular physical activity. ? Eating a healthy diet. ? Avoiding tobacco and drug use. ? Limiting alcohol use. ? Practicing safe sex. ? Taking low-dose aspirin every day starting at age 88. What happens during an annual well check? The services and screenings done by your health care provider during your annual well check will depend on your age, overall health, lifestyle risk factors, and family history of disease. Counseling Your health care provider may ask you questions about your:  Alcohol use.  Tobacco use.  Drug use.  Emotional well-being.  Home and relationship well-being.  Sexual activity.  Eating habits.  Work and work Statistician.  Screening You may have the following tests or measurements:  Height, weight, and BMI.  Blood pressure.  Lipid and cholesterol levels. These may be checked every 5 years, or more frequently if you are over 70 years old.  Skin check.  Lung cancer screening. You may have this screening every year starting at age 52 if you have a 30-pack-year  history of smoking and currently smoke or have quit within the past 15 years.  Fecal occult blood test (FOBT) of the stool. You may have this test every year starting at age 75.  Flexible sigmoidoscopy or colonoscopy. You may have a sigmoidoscopy every 5 years or a colonoscopy every 10 years starting at age 64.  Prostate cancer screening. Recommendations will vary depending on your family history and other risks.  Hepatitis C blood test.  Hepatitis B blood test.  Sexually transmitted disease (STD) testing.  Diabetes screening. This is done by checking your blood sugar (glucose) after you have not eaten for a while (fasting). You may have this done every 1-3 years.  Discuss your test results, treatment options, and if necessary, the need for more tests with your health care provider. Vaccines Your health care provider may recommend certain vaccines, such as:  Influenza vaccine. This is recommended every year.  Tetanus, diphtheria, and acellular pertussis (Tdap, Td) vaccine. You may need a Td booster every 10 years.  Varicella vaccine. You may need this if you have not been vaccinated.  Zoster vaccine. You may need this after age 2.  Measles, mumps, and rubella (MMR) vaccine. You may need at least one dose of MMR if you were born in 1957 or later. You may also need a second dose.  Pneumococcal 13-valent conjugate (PCV13) vaccine. You may need this if you have certain conditions and have not been vaccinated.  Pneumococcal polysaccharide (PPSV23) vaccine. You may need one or two doses if you smoke cigarettes or if you  have certain conditions.  Meningococcal vaccine. You may need this if you have certain conditions.  Hepatitis A vaccine. You may need this if you have certain conditions or if you travel or work in places where you may be exposed to hepatitis A.  Hepatitis B vaccine. You may need this if you have certain conditions or if you travel or work in places where you may be  exposed to hepatitis B.  Haemophilus influenzae type b (Hib) vaccine. You may need this if you have certain risk factors.  Talk to your health care provider about which screenings and vaccines you need and how often you need them. This information is not intended to replace advice given to you by your health care provider. Make sure you discuss any questions you have with your health care provider. Document Released: 05/27/2015 Document Revised: 01/18/2016 Document Reviewed: 03/01/2015 Elsevier Interactive Patient Education  [employer].

## 2017-11-29 NOTE — Assessment & Plan Note (Signed)
Stable in the office today, continue losartan and carvedilol.

## 2017-11-29 NOTE — Progress Notes (Signed)
Subjective:    Patient ID: Alec Smith, male    DOB: [birth year], 62 y.o.   MRN: [id number]  HPI  Alec Smith is a 62 year old male who presents today for complete physical.  Immunizations: -Tetanus: Unsure, due today. -Influenza: Did complete last season  -Pneumonia: Completed in 2016 -Shingles:Completed in 2016  Diet: He endorses a healthy diet. Breakfast: Egg, english muffin, oatmeal Lunch: Leftovers, some fast food Dinner: Meat, vegetables, starch Snacks: None Desserts: None Beverages: Water, sweet tea  Exercise: He is walking some, playing golf Eye exam: Completed in 2018 Dental exam: Completes semi-annually  Colonoscopy: Completed in 2016, due in 2021 PSA: Following with Urology Hep C Screen: Due   Review of Systems  Constitutional: Negative for unexpected weight change.  HENT: Negative for rhinorrhea.   Respiratory: Negative for cough and shortness of breath.   Cardiovascular: Negative for chest pain.  Gastrointestinal: Negative for constipation and diarrhea.       GERD  Genitourinary: Negative for difficulty urinating.  Musculoskeletal: Negative for arthralgias and myalgias.  Skin: Negative for rash.  Allergic/Immunologic: Negative for environmental allergies.  Neurological: Negative for dizziness, numbness and headaches.  Psychiatric/Behavioral: The patient is not nervous/anxious.        Past Medical History:  Diagnosis Date  . CAD (coronary artery disease)   . GERD (gastroesophageal reflux disease)   . Hx of adenomatous polyp of colon 02/09/2015  . Hyperlipidemia   . Hypertension      Social History   Socioeconomic History  . Marital status: Married    Spouse name: Not on file  . Number of children: 2  . Years of education: Not on file  . Highest education level: Not on file  Occupational History  . Occupation: school Technical brewer: [employer]  . Financial resource strain: Not on file  . Food insecurity:      Worry: Not on file    Inability: Not on file  . Transportation needs:    Medical: Not on file    Non-medical: Not on file  Tobacco Use  . Smoking status: Never Smoker  . Smokeless tobacco: Never Used  Substance and Sexual Activity  . Alcohol use: Yes    Alcohol/week: 0.0 oz    Comment: once or twice a year  . Drug use: No  . Sexual activity: Not on file  Lifestyle  . Physical activity:    Days per week: Not on file    Minutes per session: Not on file  . Stress: Not on file  Relationships  . Social connections:    Talks on phone: Not on file    Gets together: Not on file    Attends religious service: Not on file    Active member of club or organization: Not on file    Attends meetings of clubs or organizations: Not on file    Relationship status: Not on file  . Intimate partner violence:    Fear of current or ex partner: Not on file    Emotionally abused: Not on file    Physically abused: Not on file    Forced sexual activity: Not on file  Other Topics Concern  . Not on file  Social History Narrative   Married.   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   01/21/2015    Past Surgical History:  Procedure Laterality Date  .  APPENDECTOMY  1966  . CORONARY ANGIOPLASTY WITH STENT PLACEMENT  2010  . LUMBAR DISC SURGERY  2000   Lower back  . PROSTATE BIOPSY    . TONSILLECTOMY     age 58    Family History  Problem Relation Age of Onset  . Aneurysm Mother 26       AAA  . Heart failure Father   . Prostate cancer Paternal Grandfather   . Vaginal cancer Paternal Grandmother        spread to lungs  . Colon cancer Neg Hx   . Esophageal cancer Neg Hx   . Stomach cancer Neg Hx   . Rectal cancer Neg Hx     No Known Allergies  Current Outpatient Medications on File Prior to Visit  Medication Sig Dispense Refill  . atorvastatin (LIPITOR) 40 MG tablet Take 1 tablet (40 mg total) by mouth daily. 90 tablet 3   . carvedilol (COREG) 3.125 MG tablet TAKE 1 TABLET (3.125 MG TOTAL) BY MOUTH 2 (TWO) TIMES DAILY WITH A MEAL. 180 tablet 3  . cetirizine (ZYRTEC) 10 MG tablet Take 10 mg by mouth daily.      . clopidogrel (PLAVIX) 75 MG tablet TAKE 1 TABLET BY MOUTH EVERY DAY 90 tablet 3  . KRILL OIL PO Take 300 mg by mouth daily.     Marland Kitchen losartan (COZAAR) 50 MG tablet TAKE 1 TABLET (50 MG TOTAL) BY MOUTH DAILY. 90 tablet 3  . Multiple Vitamin (MULTIVITAMIN WITH MINERALS) TABS tablet Take 1 tablet by mouth daily.    . pantoprazole (PROTONIX) 40 MG tablet TAKE 1 TABLET (40 MG TOTAL) BY MOUTH DAILY. NEED APPOINTMENT FOR ANY MORE REFILLS 30 tablet 0   No current facility-administered medications on file prior to visit.     BP 114/66   Pulse (!) 58   Temp 98.1 F (36.7 C) (Oral)   Ht 5\' 8"  (1.727 m)   Wt 197 lb 8 oz (89.6 kg)   SpO2 96%   BMI 30.03 kg/m    Objective:   Physical Exam  Constitutional: He is oriented to person, place, and time. He appears well-nourished.  HENT:  Mouth/Throat: No oropharyngeal exudate.  Eyes: Pupils are equal, round, and reactive to light. EOM are normal.  Neck: Neck supple. No thyromegaly present.  Cardiovascular: Normal rate and regular rhythm.  Respiratory: Effort normal and breath sounds normal.  GI: Soft. Bowel sounds are normal. There is no tenderness.  Musculoskeletal: Normal range of motion.  Neurological: He is alert and oriented to person, place, and time.  Skin: Skin is warm and dry.  Psychiatric: He has a normal mood and affect.           Assessment & Plan:

## 2017-11-29 NOTE — Assessment & Plan Note (Signed)
Recent labs unremarkable, continue atorvastatin.

## 2017-11-29 NOTE — Assessment & Plan Note (Signed)
Last A1C normal, will recheck today. Discussed importance of regular exercise, healthy diet.

## 2017-11-29 NOTE — Addendum Note (Signed)
Addended by: Jacqualin Combes on: 11/29/2017 12:46 PM   Modules accepted: Orders

## 2017-11-30 LAB — HEPATITIS C ANTIBODY
Hepatitis C Ab: NONREACTIVE
SIGNAL TO CUT-OFF: 0.01 (ref <1.00)

## 2017-12-02 ENCOUNTER — Other Ambulatory Visit: Payer: Self-pay | Admitting: Primary Care

## 2017-12-02 DIAGNOSIS — R7303 Prediabetes: Secondary | ICD-10-CM

## 2017-12-06 ENCOUNTER — Telehealth: Payer: Self-pay | Admitting: Primary Care

## 2017-12-06 DIAGNOSIS — K219 Gastro-esophageal reflux disease without esophagitis: Secondary | ICD-10-CM

## 2017-12-06 NOTE — Telephone Encounter (Signed)
Copied from [place] [id number]. Topic: Inquiry >> Dec 06, 2017  2:05 PM Mylinda Latina, NT wrote: Reason for CRM: patient called and states that he contact the pharmacy and his pantoprazole (PROTONIX) 40 MG tablet [[id number] needed to be sent in for 90 days instead of 30. He is wondering if a new RX can be sent   [id number] [address], [place] (Phone) [phone] (Fax)

## 2017-12-09 MED ORDER — PANTOPRAZOLE SODIUM 40 MG PO TBEC: 40.0000 mg | DELAYED_RELEASE_TABLET | Freq: Every day | ORAL | 1 refills | Status: DC

## 2017-12-09 NOTE — Telephone Encounter (Signed)
I spoke with pt and pt had annual 11/29/17; protonix refilled per protocol to [facility]. Pt voiced understanding.

## 2017-12-09 NOTE — Telephone Encounter (Signed)
Patient requesting 90 days instead of 30 days of Protonix sent on 11/26/17.

## 2017-12-10 ENCOUNTER — Other Ambulatory Visit: Payer: Self-pay | Admitting: Cardiovascular Disease

## 2018-02-01 ENCOUNTER — Other Ambulatory Visit: Payer: Self-pay | Admitting: Cardiovascular Disease

## 2018-02-18 ENCOUNTER — Other Ambulatory Visit: Payer: Self-pay | Admitting: Cardiovascular Disease

## 2018-03-28 ENCOUNTER — Ambulatory Visit: Payer: BC Managed Care – PPO | Admitting: Primary Care

## 2018-03-28 ENCOUNTER — Encounter: Payer: Self-pay | Admitting: Primary Care

## 2018-03-28 VITALS — BP 116/76 | HR 67 | Temp 98.1°F | Ht 68.0 in | Wt 200.5 lb

## 2018-03-28 DIAGNOSIS — M542 Cervicalgia: Secondary | ICD-10-CM

## 2018-03-28 MED ORDER — TIZANIDINE HCL 4 MG PO TABS: 4.0000 mg | ORAL_TABLET | Freq: Three times a day (TID) | ORAL | 0 refills | Status: DC | PRN

## 2018-03-28 MED ORDER — NAPROXEN 500 MG PO TABS: 500.0000 mg | ORAL_TABLET | Freq: Two times a day (BID) | ORAL | 0 refills | Status: DC

## 2018-03-28 NOTE — Progress Notes (Signed)
Subjective:    Patient ID: Alec Smith, male    DOB: 1955/09/13, 62 y.o.   MRN: [id number]  HPI  Alec Smith is a 62 year old male with a history of hypertension and hyperlipidemia who presents today with a chief complaint of neck pain.   He endorses neck stiffness to bilateral sides of his posterior neck that began three weeks ago. He notices his pain with working on the computer, laying down at night. He describes his pain as a stiffness/tightness. He's been applying heat to the neck at night with little improvement. He denies injury, trauma, numbness/tingling, radiation of pain to his upper extremities. He's been taking naproxen intermittently with temporary improvement.   Review of Systems  Constitutional: Negative for fever.  Musculoskeletal: Positive for myalgias.  Skin: Negative for color change.  Neurological: Negative for weakness and numbness.     Past Medical History:  Diagnosis Date  . CAD (coronary artery disease)   . GERD (gastroesophageal reflux disease)   . Hx of adenomatous polyp of colon 02/09/2015  . Hyperlipidemia   . Hypertension      Social History   Socioeconomic History  . Marital status: Married    Spouse name: Not on file  . Number of children: 2  . Years of education: Not on file  . Highest education level: Not on file  Occupational History  . 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 Financial resource strain: Not on file  . Food insecurity:    Worry: Not on file    Inability: Not on file  . Transportation needs:    Medical: Not on file    Non-medical: Not on file  Tobacco Use  . Smoking status: Never Smoker  . Smokeless tobacco: Never Used  Substance and Sexual Activity  . Alcohol use: Yes    Alcohol/week: 0.0 standard drinks    Comment: once or twice a year  . Drug use: No  . Sexual activity: Not on file  Lifestyle  . Physical activity:    Days per week: Not on file    Minutes per  session: Not on file  . Stress: Not on file  Relationships  . Social connections:    Talks on phone: Not on file    Gets together: Not on file    Attends religious service: Not on file    Active member of club or organization: Not on file    Attends meetings of clubs or organizations: Not on file    Relationship status: Not on file  . Intimate partner violence:    Fear of current or ex partner: Not on file    Emotionally abused: Not on file    Physically abused: Not on file    Forced sexual activity: Not on file  Other Topics Concern  . Not on file  Social History Narrative   Married.   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   01/21/2015    Past Surgical History:  Procedure Laterality Date  . APPENDECTOMY  1966  . CORONARY ANGIOPLASTY WITH STENT PLACEMENT  2010  . LUMBAR DISC SURGERY  2000   Lower back  . PROSTATE BIOPSY    . TONSILLECTOMY     age 73    Family History  Problem Relation Age of Onset  . Aneurysm Mother 36  AAA  . Heart failure Father   . Prostate cancer Paternal Grandfather   . Vaginal cancer Paternal Grandmother        spread to lungs  . Colon cancer Neg Hx   . Esophageal cancer Neg Hx   . Stomach cancer Neg Hx   . Rectal cancer Neg Hx     No Known Allergies  Current Outpatient Medications on File Prior to Visit  Medication Sig Dispense Refill  . atorvastatin (LIPITOR) 40 MG tablet Take 1 tablet (40 mg total) by mouth daily. 90 tablet 3  . carvedilol (COREG) 3.125 MG tablet TAKE 1 TABLET (3.125 MG TOTAL) BY MOUTH 2 (TWO) TIMES DAILY WITH A MEAL. 180 tablet 3  . cetirizine (ZYRTEC) 10 MG tablet Take 10 mg by mouth daily.      . clopidogrel (PLAVIX) 75 MG tablet TAKE 1 TABLET BY MOUTH EVERY DAY 90 tablet 2  . KRILL OIL PO Take 300 mg by mouth daily.     Marland Kitchen losartan (COZAAR) 50 MG tablet TAKE 1 TABLET (50 MG TOTAL) BY MOUTH DAILY. 90 tablet 3  . Multiple Vitamin (MULTIVITAMIN WITH  MINERALS) TABS tablet Take 1 tablet by mouth daily.    . pantoprazole (PROTONIX) 40 MG tablet Take 1 tablet (40 mg total) by mouth daily. 90 tablet 1   No current facility-administered medications on file prior to visit.     BP 116/76   Pulse 67   Temp 98.1 F (36.7 C) (Oral)   Ht 5\' 8"  (1.727 m)   Wt 200 lb 8 oz (90.9 kg)   SpO2 98%   BMI 30.49 kg/m    Objective:   Physical Exam  Constitutional: He appears well-nourished.  Neck: Neck supple. No spinous process tenderness and no muscular tenderness present. No neck rigidity. Decreased range of motion present.  Decrease in ROM with most planes of ROM, mostly with left and right rotation.   Cardiovascular: Normal rate and regular rhythm.  Respiratory: Effort normal and breath sounds normal.  Skin: Skin is warm and dry.           Assessment & Plan:  Acute Neck Pain:  Present for the last 3 weeks, slight improvement with heat/NSAID's.  Exam today consistent for MSK involvement. No radiculopathy, numbness/tingling, spinal process tenderness to suggest cervical spine involvement. No trauma. Rx for Tizanidine sent to pharmacy to use PRN, drowsiness precautions provided. Rx for Naproxen sent to pharmacy to use BID PRN. Continue heat, increase frequency of application. Discussed to work on neck stretching.  He will update if no improvement.  [name], NP

## 2018-03-28 NOTE — Patient Instructions (Signed)
Start naproxen 500 mg tablets. Take 1 tablet by mouth twice daily as needed for pain. Do this for at least 5 days consistently.  You may take the tizanidine tablets every 8 hours as needed for muscle spasms/tightness.  Apply heat to the neck 3 times daily for 20-30 minutes.   Try to do some stretching to the neck several times daily to help loosen the muscles.  Please notify me if no improvement in 1 week.  It was a pleasure to see you today!

## 2018-06-01 ENCOUNTER — Other Ambulatory Visit: Payer: Self-pay | Admitting: Primary Care

## 2018-06-01 DIAGNOSIS — M542 Cervicalgia: Secondary | ICD-10-CM

## 2018-06-02 NOTE — Telephone Encounter (Signed)
This medication was prescribed for an acute visit for neck pain. How's his neck doing overall? Any better? How often is he having to use the Tizanidine muscle relaxer? Does it help?

## 2018-06-02 NOTE — Telephone Encounter (Signed)
Last prescribed on 03/28/2018. Last office visit on 03/28/2018. No future appointment

## 2018-06-03 NOTE — Telephone Encounter (Signed)
Message left for patient to return my call.  

## 2018-06-05 NOTE — Telephone Encounter (Signed)
Message left for patient to return my call.  

## 2018-06-09 NOTE — Telephone Encounter (Signed)
Reply to message from Ona

## 2018-06-09 NOTE — Telephone Encounter (Signed)
Noted, refill sent to pharmacy. 

## 2018-06-09 NOTE — Telephone Encounter (Signed)
Per patient through MyChart, he stated that   Neck was fine for a while and has gotten stiff and sore again. The medication helped to relieve the symptoms

## 2018-06-19 ENCOUNTER — Other Ambulatory Visit: Payer: Self-pay | Admitting: Primary Care

## 2018-06-19 DIAGNOSIS — K219 Gastro-esophageal reflux disease without esophagitis: Secondary | ICD-10-CM

## 2018-09-16 ENCOUNTER — Ambulatory Visit (INDEPENDENT_AMBULATORY_CARE_PROVIDER_SITE_OTHER): Payer: BC Managed Care – PPO | Admitting: Primary Care

## 2018-09-16 ENCOUNTER — Other Ambulatory Visit: Payer: Self-pay

## 2018-09-16 DIAGNOSIS — J302 Other seasonal allergic rhinitis: Secondary | ICD-10-CM

## 2018-09-16 MED ORDER — LEVOCETIRIZINE DIHYDROCHLORIDE 5 MG PO TABS: 5.0000 mg | ORAL_TABLET | Freq: Every evening | ORAL | 0 refills | Status: DC

## 2018-09-16 MED ORDER — FLUTICASONE PROPIONATE 50 MCG/ACT NA SUSP: 1.0000 | Freq: Two times a day (BID) | NASAL | 0 refills | Status: DC

## 2018-09-16 NOTE — Assessment & Plan Note (Signed)
Symptoms and overall appearance representative of seasonal allergies. Will have him stop benadryl and his current nasal spray. Start Xyzal and Flonase. He will update.

## 2018-09-16 NOTE — Telephone Encounter (Signed)
Will you please schedule for a virtual visit? Thanks!

## 2018-09-16 NOTE — Patient Instructions (Signed)
Start levocetirizine (Xyzal) 5 mg tablets once nightly for allergies.  Stop Benadryl. Stop your current nasal spray.  Try using Flonase (fluticasone) nasal spray for nasal congestion and head congestion. Instill 1 spray in each nostril twice daily.   Please update me in 1-2 weeks if no improvement.  It was a pleasure to see you today!

## 2018-09-16 NOTE — Progress Notes (Signed)
Subjective:    Patient ID: Alec Smith, male    DOB: [birth year], 63 y.o.   MRN: [id number]  HPI  Virtual Visit via Video Note  I connected with [name] on 09/16/18 at  3:40 PM EDT by a video enabled telemedicine application and verified that I am speaking with the correct person using two identifiers.  Location: Patient: home Provider: office   I discussed the limitations of evaluation and management by telemedicine and the availability of in person appointments. The patient expressed understanding and agreed to proceed.  History of Present Illness:  [name] is a 64 year old male with a history of hypertension, CAD, GERD, seasonal allergies who presents today with a chief complaint of seasonal allergies.  Symptoms include itchy watery eyes, sneezing, head congestion. He's been taking OTC benadryl 3-4 times daily with temporary improvement. He's using He's tried Claritin in the past.    Observations/Objective:  Alert and oriented. Appears well, not sickly. No cough. No distress. Speaking in complete sentences.   Assessment and Plan:  See problem based charting.  Follow Up Instructions:  Start levocetirizine (Xyzal) 5 mg tablets once nightly for allergies.  Stop Benadryl. Stop your current nasal spray.  Try using Flonase (fluticasone) nasal spray for nasal congestion and head congestion. Instill 1 spray in each nostril twice daily.   Please update me in 1-2 weeks if no improvement.  It was a pleasure to see you today!    I discussed the assessment and treatment plan with the patient. The patient was provided an opportunity to ask questions and all were answered. The patient agreed with the plan and demonstrated an understanding of the instructions.   The patient was advised to call back or seek an in-person evaluation if the symptoms worsen or if the condition fails to improve as anticipated.    [name], NP .  Review of Systems   Constitutional: Negative for fever.  HENT: Positive for congestion, postnasal drip and rhinorrhea. Negative for ear pain and sore throat.   Respiratory: Negative for cough and shortness of breath.   Allergic/Immunologic: Positive for environmental allergies.       Past Medical History:  Diagnosis Date  . CAD (coronary artery disease)   . GERD (gastroesophageal reflux disease)   . Hx of adenomatous polyp of colon 02/09/2015  . Hyperlipidemia   . Hypertension      Social History   Socioeconomic History  . Marital status: Married    Spouse name: Not on file  . Number of children: 2  . Years of education: Not on file  . Highest education level: Not on file  Occupational History  . 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 Financial resource strain: Not on file  . Food insecurity:    Worry: Not on file    Inability: Not on file  . Transportation needs:    Medical: Not on file    Non-medical: Not on file  Tobacco Use  . Smoking status: Never Smoker  . Smokeless tobacco: Never Used  Substance and Sexual Activity  . Alcohol use: Yes    Alcohol/week: 0.0 standard drinks    Comment: once or twice a year  . Drug use: No  . Sexual activity: Not on file  Lifestyle  . Physical activity:    Days per week: Not on file    Minutes per session: Not on file  . Stress: Not on file  Relationships  . Social connections:    Talks on phone: Not on file    Gets together: Not on file    Attends religious service: Not on file    Active member of club or organization: Not on file    Attends meetings of clubs or organizations: Not on file    Relationship status: Not on file  . Intimate partner violence:    Fear of current or ex partner: Not on file    Emotionally abused: Not on file    Physically abused: Not on file    Forced sexual activity: Not on file  Other Topics Concern  . Not on file  Social History Narrative   Married.   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   01/21/2015    Past Surgical History:  Procedure Laterality Date  . APPENDECTOMY  1966  . CORONARY ANGIOPLASTY WITH STENT PLACEMENT  2010  . LUMBAR DISC SURGERY  2000   Lower back  . PROSTATE BIOPSY    . TONSILLECTOMY     age 38    Family History  Problem Relation Age of Onset  . Aneurysm Mother 31       AAA  . Heart failure Father   . Prostate cancer Paternal Grandfather   . Vaginal cancer Paternal Grandmother        spread to lungs  . Colon cancer Neg Hx   . Esophageal cancer Neg Hx   . Stomach cancer Neg Hx   . Rectal cancer Neg Hx     No Known Allergies  Current Outpatient Medications on File Prior to Visit  Medication Sig Dispense Refill  . atorvastatin (LIPITOR) 40 MG tablet Take 1 tablet (40 mg total) by mouth daily. 90 tablet 3  . carvedilol (COREG) 3.125 MG tablet TAKE 1 TABLET (3.125 MG TOTAL) BY MOUTH 2 (TWO) TIMES DAILY WITH A MEAL. 180 tablet 3  . cetirizine (ZYRTEC) 10 MG tablet Take 10 mg by mouth daily.      . clopidogrel (PLAVIX) 75 MG tablet TAKE 1 TABLET BY MOUTH EVERY DAY 90 tablet 2  . KRILL OIL PO Take 300 mg by mouth daily.     Marland Kitchen losartan (COZAAR) 50 MG tablet TAKE 1 TABLET (50 MG TOTAL) BY MOUTH DAILY. 90 tablet 3  . Multiple Vitamin (MULTIVITAMIN WITH MINERALS) TABS tablet Take 1 tablet by mouth daily.    . naproxen (NAPROSYN) 500 MG tablet Take 1 tablet (500 mg total) by mouth 2 (two) times daily with a meal. As needed for pain. 30 tablet 0  . pantoprazole (PROTONIX) 40 MG tablet TAKE 1 TABLET BY MOUTH EVERY DAY 90 tablet 1  . tiZANidine (ZANAFLEX) 4 MG tablet TAKE 1 TABLET (4 MG TOTAL) BY MOUTH EVERY 8 (EIGHT) HOURS AS NEEDED FOR MUSCLE SPASMS. 30 tablet 0   No current facility-administered medications on file prior to visit.     There were no vitals taken for this visit.   Objective:   Physical Exam  Constitutional: He appears well-nourished. He  does not have a sickly appearance. He does not appear ill.  Respiratory: Effort normal. No respiratory distress.  Psychiatric: He has a normal mood and affect.           Assessment & Plan:

## 2018-10-16 ENCOUNTER — Telehealth: Payer: Self-pay

## 2018-10-16 NOTE — Telephone Encounter (Signed)
Spoke with patient Made him aware that Dr. Rockey Situ is currently limiting patients coming in the office due to Alec Smith.  Offered teleheath visit. Patient declined.  Patient would like to wait and see Dr. Rockey Situ in person.

## 2018-10-28 ENCOUNTER — Ambulatory Visit: Payer: BC Managed Care – PPO | Admitting: Cardiovascular Disease

## 2018-11-07 ENCOUNTER — Other Ambulatory Visit: Payer: Self-pay | Admitting: Primary Care

## 2018-11-07 DIAGNOSIS — J302 Other seasonal allergic rhinitis: Secondary | ICD-10-CM

## 2018-11-09 ENCOUNTER — Other Ambulatory Visit: Payer: Self-pay | Admitting: Cardiovascular Disease

## 2018-11-21 ENCOUNTER — Other Ambulatory Visit: Payer: Self-pay | Admitting: Primary Care

## 2018-11-21 DIAGNOSIS — M542 Cervicalgia: Secondary | ICD-10-CM

## 2018-12-01 ENCOUNTER — Other Ambulatory Visit: Payer: Self-pay | Admitting: Primary Care

## 2018-12-01 DIAGNOSIS — J302 Other seasonal allergic rhinitis: Secondary | ICD-10-CM

## 2018-12-08 ENCOUNTER — Other Ambulatory Visit: Payer: Self-pay | Admitting: Primary Care

## 2018-12-08 DIAGNOSIS — J302 Other seasonal allergic rhinitis: Secondary | ICD-10-CM

## 2018-12-12 ENCOUNTER — Other Ambulatory Visit: Payer: Self-pay | Admitting: Primary Care

## 2018-12-12 DIAGNOSIS — Z23 Encounter for immunization: Secondary | ICD-10-CM

## 2018-12-12 MED ORDER — ZOSTER VAC RECOMB ADJUVANTED 50 MCG/0.5ML IM SUSR: 0.5000 mL | Freq: Once | INTRAMUSCULAR | 1 refills | Status: AC

## 2018-12-15 NOTE — Telephone Encounter (Signed)
Please review. Ok to be scheduled for vaccines? Except flu shot right now.

## 2018-12-17 ENCOUNTER — Other Ambulatory Visit: Payer: Self-pay | Admitting: Primary Care

## 2018-12-17 DIAGNOSIS — K219 Gastro-esophageal reflux disease without esophagitis: Secondary | ICD-10-CM

## 2018-12-19 ENCOUNTER — Other Ambulatory Visit: Payer: Self-pay | Admitting: Cardiovascular Disease

## 2018-12-26 ENCOUNTER — Other Ambulatory Visit: Payer: Self-pay | Admitting: Primary Care

## 2018-12-26 DIAGNOSIS — J302 Other seasonal allergic rhinitis: Secondary | ICD-10-CM

## 2019-01-01 ENCOUNTER — Ambulatory Visit: Payer: BC Managed Care – PPO

## 2019-01-05 ENCOUNTER — Ambulatory Visit (INDEPENDENT_AMBULATORY_CARE_PROVIDER_SITE_OTHER): Payer: BC Managed Care – PPO | Admitting: Primary Care

## 2019-01-05 ENCOUNTER — Other Ambulatory Visit: Payer: Self-pay

## 2019-01-05 VITALS — BP 120/76 | HR 65 | Temp 98.4°F | Ht 68.0 in | Wt 204.8 lb

## 2019-01-05 DIAGNOSIS — W57XXXA Bitten or stung by nonvenomous insect and other nonvenomous arthropods, initial encounter: Secondary | ICD-10-CM

## 2019-01-05 DIAGNOSIS — S40862A Insect bite (nonvenomous) of left upper arm, initial encounter: Secondary | ICD-10-CM | POA: Diagnosis not present

## 2019-01-05 MED ORDER — CEPHALEXIN 500 MG PO CAPS: 500.0000 mg | ORAL_CAPSULE | Freq: Two times a day (BID) | ORAL | 0 refills | Status: AC

## 2019-01-05 NOTE — Patient Instructions (Signed)
Start Cephalexin antibiotics for the infection. Take 1 capsule by mouth twice daily for 7 days.  You can apply topical cortisone for itching and benadryl at bedtime for swelling and itching.  Please update me in 3-4 days if no improvement.  It was a pleasure to see you today!

## 2019-01-05 NOTE — Assessment & Plan Note (Signed)
Occurred yesterday after working in the garden. Exam today with moderate erythema and is suspicious for early cellulitis. Rx for cephalexin course sent to pharmacy. Discussed use of topical steroids and benadryrl PRN. He will update.

## 2019-01-05 NOTE — Progress Notes (Signed)
Patient ID: Alec Smith, male   DOB: 17-Nov-1955, 63 y.o.   MRN: [id number] Cardiology Office Note  Date:  01/06/2019   ID:  Alec Smith [birth year], MRN [id number]  PCP:  Alec Koch, NP   Chief Complaint  Patient presents with  . other    12 month f/u no complaints today . Meds reviewed verbally with pt.    HPI:  Alec Smith is a pleasant 63 year old gentleman with history of  stress test October 21, 2008 showed anterior ischemia  Cath showing coronary artery disease,  stent placed to the proximal to mid LAD for occluded vessel,  residual RCA disease estimated at 60% with intervention performed in 2010.  He presents for follow-up of his coronary artery disease  Labs: HBA1C 6.4  Retiring one year No symptoms  Active No regular exercise program  without side effects  No recent lab work available, he will call PMD Cholesterol at goal past several year  EKG personally reviewed by myself on todays visit shows normal sinus rhythm with rate 67 bpm, no significant ST or T-wave changes  Cath report:  2010  a 1.5 x 10 apex crosswire  was used to ultimately gain access into the distal vessel.  This was  used to then dilate the chronically occluded segment.  A 2.0 x 30 apex  was then used to further dilate the segment from the point of distal  occlusion up to the point of proximal occlusion.  Overlapping 2.25 x 32  Taxus Adam drug-eluting stents were then deployed beginning distally and  then working proximally.  This was followed by 2.25 x 16 Taxus Adam  deployed at 16 and 18 atmospheres.  A 3.0 and 12 Taxus was then deployed  in the proximal LAD  that had 90% stenosis and the overlapping segment  was stented with a 2.75 x 8 Taxus.  The entire 2.25 segment was then  postdilated with a 2.5 x 25 [place] Voyager up to 16 and 18 atmospheres, the  more proximal segment was postdilated with a 2.75 x a 15 [place] Voyager and  the most proximal segment where the 3.0 stent was  deployed was  postdilated with a 3.25 x 12 [place] Voyager.  PMH:   has a past medical history of CAD (coronary artery disease), GERD (gastroesophageal reflux disease), adenomatous polyp of colon (02/09/2015), Hyperlipidemia, and Hypertension.  PSH:    Past Surgical History:  Procedure Laterality Date  . APPENDECTOMY  1966  . CORONARY ANGIOPLASTY WITH STENT PLACEMENT  2010  . LUMBAR DISC SURGERY  2000   Lower back  . PROSTATE BIOPSY    . TONSILLECTOMY     age 21    Current Outpatient Medications  Medication Sig Dispense Refill  . atorvastatin (LIPITOR) 40 MG tablet Take 1 tablet (40 mg total) by mouth daily. 90 tablet 3  . carvedilol (COREG) 3.125 MG tablet TAKE 1 TABLET (3.125 MG TOTAL) BY MOUTH 2 (TWO) TIMES DAILY WITH A MEAL. 180 tablet 0  . cephALEXin (KEFLEX) 500 MG capsule Take 1 capsule (500 mg total) by mouth 2 (two) times daily for 7 days. 14 capsule 0  . clopidogrel (PLAVIX) 75 MG tablet TAKE 1 TABLET BY MOUTH EVERY DAY 90 tablet 0  . fluticasone (FLONASE) 50 MCG/ACT nasal spray PLACE 1 SPRAY INTO BOTH NOSTRILS 2 (TWO) TIMES DAILY 16 mL 2  . KRILL OIL PO Take 300 mg by mouth daily.     Marland Kitchen levocetirizine (XYZAL) 5 MG tablet  TAKE 1 TABLET (5 MG TOTAL) BY MOUTH EVERY EVENING. FOR ALLERGIES. 90 tablet 0  . losartan (COZAAR) 50 MG tablet TAKE 1 TABLET (50 MG TOTAL) BY MOUTH DAILY. 90 tablet 3  . Multiple Vitamin (MULTIVITAMIN WITH MINERALS) TABS tablet Take 1 tablet by mouth daily.    . naproxen (NAPROSYN) 500 MG tablet Take 1 tablet (500 mg total) by mouth 2 (two) times daily with a meal. As needed for pain. 30 tablet 0  . pantoprazole (PROTONIX) 40 MG tablet TAKE 1 TABLET BY MOUTH EVERY DAY 90 tablet 1  . tiZANidine (ZANAFLEX) 4 MG tablet TAKE 1 TABLET (4 MG TOTAL) BY MOUTH EVERY 8 (EIGHT) HOURS AS NEEDED FOR MUSCLE SPASMS. 30 tablet 0   No current facility-administered medications for this visit.     Allergies:   Patient has no known allergies.   Social History:  The patient   reports that he has never smoked. He has never used smokeless tobacco. He reports current alcohol use. He reports that he does not use drugs.   Family History:   family history includes Aneurysm (age of onset: 54) in his mother; Heart failure in his father; Prostate cancer in his paternal grandfather; Vaginal cancer in his paternal grandmother.    Review of Systems: Review of Systems  Constitutional: Negative.   Respiratory: Negative.   Cardiovascular: Negative.   Gastrointestinal: Negative.   Musculoskeletal: Negative.   Neurological: Negative.   Psychiatric/Behavioral: Negative.   All other systems reviewed and are negative.   PHYSICAL EXAM: VS:  BP 122/80 (BP Location: Left Arm, Patient Position: Sitting, Cuff Size: Normal)   Pulse 67   Ht 5\' 8"  (1.727 m)   Wt 201 lb 12 oz (91.5 kg)   BMI 30.68 kg/m  , BMI Body mass index is 30.68 kg/m.  Constitutional:  oriented to person, place, and time. No distress.  HENT:  Head: Grossly normal Eyes:  no discharge. No scleral icterus.  Neck: No JVD, no carotid bruits  Cardiovascular: Regular rate and rhythm, no murmurs appreciated Pulmonary/Chest: Clear to auscultation bilaterally, no wheezes or rails Abdominal: Soft.  no distension.  no tenderness.  Musculoskeletal: Normal range of motion Neurological:  normal muscle tone. Coordination normal. No atrophy Skin: Skin warm and dry Psychiatric: normal affect, pleasant   Recent Labs: No results found for requested labs within last 8760 hours.    Lipid Panel Lab Results  Component Value Date   CHOL 117 10/31/2017   HDL 32 (L) 10/31/2017   LDLCALC 65 10/31/2017   TRIG 100 10/31/2017      Wt Readings from Last 3 Encounters:  01/06/19 201 lb 12 oz (91.5 kg)  01/05/19 204 lb 12 oz (92.9 kg)  03/28/18 200 lb 8 oz (90.9 kg)      ASSESSMENT AND PLAN:  Atherosclerosis of native coronary artery with angina pectoris, unspecified whether native or transplanted heart [facility])   Currently with no symptoms of angina. No further workup at this time. Continue current medication regimen.stable  Benign essential HTN -  Blood pressure is well controlled on today's visit. No changes made to the medications.   Hyperlipidemia He prefers to have labs through PMD  CAD S/P percutaneous coronary angioplasty/stents Currently with no symptoms of angina. No further workup at this time. Continue current medication regimen.   Borderline diabetes Recommended dietary changes, exercise program   Disposition:   F/U  12 months   Total encounter time more than 25 minutes  Greater than 50% was spent in  counseling and coordination of care with the patient     Orders Placed This Encounter  Procedures  . EKG 12-Lead     Signed, Esmond Plants, M.D., Ph.D. 01/06/2019  [facility] [place], [place] [phone]

## 2019-01-05 NOTE — Progress Notes (Signed)
   Subjective:    Patient ID: Alec Smith, male    DOB: 02/17/1956, 63 y.o.   MRN: [id number]  HPI  Alec Smith is a 63 year old male with a history of hypertension, CAD, prediabetes, hyperlipidemia who presents today with a chief complaint of insect bite.  His bite is located to the left antecubital fossa which he first noticed yesterday after working in the garden. This morning he's noticed swelling and itching without tenderness. The rash and redness has increased over the last 12 hours.   Review of Systems  Constitutional: Negative for fever.  Respiratory: Negative for chest tightness and shortness of breath.   Skin: Positive for color change and rash.       Objective:   Physical Exam  Constitutional: He appears well-nourished.  Cardiovascular: Normal rate.  Respiratory: Effort normal.  Musculoskeletal:       Arms:  Skin: Skin is warm and dry. There is erythema.     Moderate area of erythema with bite entry visible. Firm area surrounding bite entry. See picture.            Assessment & Plan:

## 2019-01-06 ENCOUNTER — Ambulatory Visit: Payer: BC Managed Care – PPO | Admitting: Primary Care

## 2019-01-06 ENCOUNTER — Ambulatory Visit (INDEPENDENT_AMBULATORY_CARE_PROVIDER_SITE_OTHER): Payer: BC Managed Care – PPO | Admitting: Cardiovascular Disease

## 2019-01-06 ENCOUNTER — Encounter: Payer: Self-pay | Admitting: Cardiovascular Disease

## 2019-01-06 VITALS — BP 122/80 | HR 67 | Ht 68.0 in | Wt 201.8 lb

## 2019-01-06 DIAGNOSIS — I10 Essential (primary) hypertension: Secondary | ICD-10-CM | POA: Diagnosis not present

## 2019-01-06 DIAGNOSIS — E782 Mixed hyperlipidemia: Secondary | ICD-10-CM

## 2019-01-06 DIAGNOSIS — I25118 Atherosclerotic heart disease of native coronary artery with other forms of angina pectoris: Secondary | ICD-10-CM | POA: Diagnosis not present

## 2019-01-06 DIAGNOSIS — Z9861 Coronary angioplasty status: Secondary | ICD-10-CM | POA: Diagnosis not present

## 2019-01-06 DIAGNOSIS — I2510 Atherosclerotic heart disease of native coronary artery without angina pectoris: Secondary | ICD-10-CM

## 2019-01-06 NOTE — Patient Instructions (Signed)

## 2019-01-13 ENCOUNTER — Other Ambulatory Visit: Payer: Self-pay

## 2019-01-13 MED ORDER — ATORVASTATIN CALCIUM 40 MG PO TABS: 40.0000 mg | ORAL_TABLET | Freq: Every day | ORAL | 3 refills | Status: DC

## 2019-02-06 ENCOUNTER — Other Ambulatory Visit: Payer: Self-pay | Admitting: Cardiovascular Disease

## 2019-02-15 ENCOUNTER — Other Ambulatory Visit: Payer: Self-pay | Admitting: Primary Care

## 2019-02-15 DIAGNOSIS — I10 Essential (primary) hypertension: Secondary | ICD-10-CM

## 2019-02-15 DIAGNOSIS — Z125 Encounter for screening for malignant neoplasm of prostate: Secondary | ICD-10-CM

## 2019-02-15 DIAGNOSIS — R7303 Prediabetes: Secondary | ICD-10-CM

## 2019-02-15 DIAGNOSIS — E782 Mixed hyperlipidemia: Secondary | ICD-10-CM

## 2019-02-19 ENCOUNTER — Other Ambulatory Visit: Payer: BC Managed Care – PPO

## 2019-02-19 ENCOUNTER — Other Ambulatory Visit: Payer: Self-pay

## 2019-02-20 ENCOUNTER — Other Ambulatory Visit: Payer: Self-pay

## 2019-02-20 ENCOUNTER — Other Ambulatory Visit (INDEPENDENT_AMBULATORY_CARE_PROVIDER_SITE_OTHER): Payer: BC Managed Care – PPO

## 2019-02-20 DIAGNOSIS — R7303 Prediabetes: Secondary | ICD-10-CM | POA: Diagnosis not present

## 2019-02-20 DIAGNOSIS — I10 Essential (primary) hypertension: Secondary | ICD-10-CM

## 2019-02-20 DIAGNOSIS — Z79899 Other long term (current) drug therapy: Secondary | ICD-10-CM

## 2019-02-20 DIAGNOSIS — E782 Mixed hyperlipidemia: Secondary | ICD-10-CM | POA: Diagnosis not present

## 2019-02-20 DIAGNOSIS — Z125 Encounter for screening for malignant neoplasm of prostate: Secondary | ICD-10-CM

## 2019-02-20 LAB — COMPREHENSIVE METABOLIC PANEL
ALT: 23 U/L (ref 0–53)
AST: 16 U/L (ref 0–37)
Albumin: 4.1 g/dL (ref 3.5–5.2)
Alkaline Phosphatase: 73 U/L (ref 39–117)
BUN: 27 mg/dL — ABNORMAL HIGH (ref 6–23)
CO2: 31 mEq/L (ref 19–32)
Calcium: 9.3 mg/dL (ref 8.4–10.5)
Chloride: 102 mEq/L (ref 96–112)
Creatinine, Ser: 1.1 mg/dL (ref 0.40–1.50)
GFR: 67.38 mL/min (ref >60.00)
Glucose, Bld: 105 mg/dL — ABNORMAL HIGH (ref 70–99)
Potassium: 4.3 mEq/L (ref 3.5–5.1)
Sodium: 139 mEq/L (ref 135–145)
Total Bilirubin: 0.6 mg/dL (ref 0.2–1.2)
Total Protein: 6.3 g/dL (ref 6.0–8.3)

## 2019-02-20 LAB — LIPID PANEL
Cholesterol: 110 mg/dL (ref 0–200)
HDL: 36 mg/dL — ABNORMAL LOW (ref >39.00)
LDL Cholesterol: 61 mg/dL (ref 0–99)
NonHDL: 73.89
Total CHOL/HDL Ratio: 3
Triglycerides: 62 mg/dL (ref 0.0–149.0)
VLDL: 12.4 mg/dL (ref 0.0–40.0)

## 2019-02-20 LAB — HEMOGLOBIN A1C: Hgb A1c MFr Bld: 6.5 % (ref 4.6–6.5)

## 2019-02-20 LAB — PSA: PSA: 3.19 ng/mL (ref 0.10–4.00)

## 2019-02-20 NOTE — Addendum Note (Signed)
Addended by: Ellamae Sia on: 02/20/2019 10:58 AM   Modules accepted: Orders

## 2019-02-23 ENCOUNTER — Ambulatory Visit (INDEPENDENT_AMBULATORY_CARE_PROVIDER_SITE_OTHER): Payer: BC Managed Care – PPO | Admitting: Primary Care

## 2019-02-23 ENCOUNTER — Encounter: Payer: Self-pay | Admitting: Primary Care

## 2019-02-23 ENCOUNTER — Other Ambulatory Visit: Payer: Self-pay

## 2019-02-23 VITALS — BP 120/76 | HR 68 | Temp 97.9°F | Ht 68.0 in | Wt 200.2 lb

## 2019-02-23 DIAGNOSIS — M542 Cervicalgia: Secondary | ICD-10-CM | POA: Diagnosis not present

## 2019-02-23 DIAGNOSIS — Z23 Encounter for immunization: Secondary | ICD-10-CM

## 2019-02-23 DIAGNOSIS — I2510 Atherosclerotic heart disease of native coronary artery without angina pectoris: Secondary | ICD-10-CM | POA: Diagnosis not present

## 2019-02-23 DIAGNOSIS — E782 Mixed hyperlipidemia: Secondary | ICD-10-CM

## 2019-02-23 DIAGNOSIS — E119 Type 2 diabetes mellitus without complications: Secondary | ICD-10-CM | POA: Diagnosis not present

## 2019-02-23 DIAGNOSIS — Z0000 Encounter for general adult medical examination without abnormal findings: Secondary | ICD-10-CM | POA: Diagnosis not present

## 2019-02-23 DIAGNOSIS — K219 Gastro-esophageal reflux disease without esophagitis: Secondary | ICD-10-CM

## 2019-02-23 DIAGNOSIS — M2550 Pain in unspecified joint: Secondary | ICD-10-CM

## 2019-02-23 DIAGNOSIS — Z860101 Personal history of adenomatous and serrated colon polyps: Secondary | ICD-10-CM

## 2019-02-23 DIAGNOSIS — I10 Essential (primary) hypertension: Secondary | ICD-10-CM

## 2019-02-23 DIAGNOSIS — Z86010 Personal history of colonic polyps: Secondary | ICD-10-CM

## 2019-02-23 DIAGNOSIS — Z9861 Coronary angioplasty status: Secondary | ICD-10-CM

## 2019-02-23 DIAGNOSIS — J302 Other seasonal allergic rhinitis: Secondary | ICD-10-CM

## 2019-02-23 MED ORDER — TIZANIDINE HCL 4 MG PO TABS: 4.0000 mg | ORAL_TABLET | Freq: Three times a day (TID) | ORAL | 0 refills | Status: DC | PRN

## 2019-02-23 MED ORDER — METFORMIN HCL 500 MG PO TABS: 500.0000 mg | ORAL_TABLET | Freq: Two times a day (BID) | ORAL | 1 refills | Status: DC

## 2019-02-23 MED ORDER — NAPROXEN 500 MG PO TABS: 500.0000 mg | ORAL_TABLET | Freq: Two times a day (BID) | ORAL | 0 refills | Status: DC

## 2019-02-23 NOTE — Assessment & Plan Note (Signed)
Reviewed last colonoscopy, due for repeat in 2021.

## 2019-02-23 NOTE — Assessment & Plan Note (Signed)
Immunizations UTD, influenza vaccination provided today. PSA UTD. Colonoscopy UTD, due in 2021. Encouraged regular exercise, healthy diet. Exam today unremarkable. Labs reviewed.

## 2019-02-23 NOTE — Progress Notes (Signed)
Subjective:    Patient ID: Alec Smith, male    DOB: 12-12-1955, 63 y.o.   MRN: [id number]  HPI  Mr Alec Smith is a 63 year old male who presents today for complete physical and treatment for newly diagnosed type 2 diabetes.   He has no prior diagnosis of diabetes. History of prediabetes for years.   Immunizations: -Tetanus: Completed in 2019 -Influenza: Due today -Pneumonia: Completed last in 2016, Prevnar due in 2021 -Shingles: Completed Zostavax in 2016, Shingrix in August 2020  Diet: He endorses a fair diet. Mostly eating meat/vegetables/fruit/salad. Desserts infrequently. Drinking water, sweet tea.  Exercise: He is not exercising  Eye exam: Completed in 2019 Dental exam: Completes semi-annually  Colonoscopy: Completed in 2016, due in 2021 PSA: 3.19 in 2020 Hep C Screen: Negative  BP Readings from Last 3 Encounters:  02/23/19 120/76  01/06/19 122/80  01/05/19 120/76      Review of Systems  Constitutional: Negative for unexpected weight change.  HENT: Negative for rhinorrhea.   Respiratory: Negative for cough and shortness of breath.   Cardiovascular: Negative for chest pain.  Gastrointestinal: Negative for constipation and diarrhea.  Genitourinary: Negative for difficulty urinating.  Musculoskeletal: Positive for arthralgias.       Intermittent neck and joint aches  Skin: Negative for rash.  Allergic/Immunologic: Negative for environmental allergies.  Neurological: Negative for dizziness, numbness and headaches.  Psychiatric/Behavioral: The patient is not nervous/anxious.        Past Medical History:  Diagnosis Date  . CAD (coronary artery disease)   . GERD (gastroesophageal reflux disease)   . Hx of adenomatous polyp of colon 02/09/2015  . Hyperlipidemia   . Hypertension      Social History   Socioeconomic History  . Marital status: Married    Spouse name: Not on file  . Number of children: 2  . Years of education: Not on file  . Highest  education level: Not on file  Occupational History  . Occupation: school Technical brewer: [employer]  . Financial resource strain: Not on file  . Food insecurity    Worry: Not on file    Inability: Not on file  . Transportation needs    Medical: Not on file    Non-medical: Not on file  Tobacco Use  . Smoking status: Never Smoker  . Smokeless tobacco: Never Used  Substance and Sexual Activity  . Alcohol use: Yes    Alcohol/week: 0.0 standard drinks    Comment: once or twice a year  . Drug use: No  . Sexual activity: Not on file  Lifestyle  . Physical activity    Days per week: Not on file    Minutes per session: Not on file  . Stress: Not on file  Relationships  . Social Herbalist on phone: Not on file    Gets together: Not on file    Attends religious service: Not on file    Active member of club or organization: Not on file    Attends meetings of clubs or organizations: Not on file    Relationship status: Not on file  . Intimate partner violence    Fear of current or ex partner: Not on file    Emotionally abused: Not on file    Physically abused: Not on file    Forced sexual activity: Not on file  Other Topics Concern  . Not on file  Social History Narrative  Married.   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   01/21/2015    Past Surgical History:  Procedure Laterality Date  . APPENDECTOMY  1966  . CORONARY ANGIOPLASTY WITH STENT PLACEMENT  2010  . LUMBAR DISC SURGERY  2000   Lower back  . PROSTATE BIOPSY    . TONSILLECTOMY     age 60    Family History  Problem Relation Age of Onset  . Aneurysm Mother 26       AAA  . Heart failure Father   . Prostate cancer Paternal Grandfather   . Vaginal cancer Paternal Grandmother        spread to lungs  . Colon cancer Neg Hx   . Esophageal cancer Neg Hx   . Stomach cancer Neg Hx   . Rectal cancer Neg Hx      No Known Allergies  Current Outpatient Medications on File Prior to Visit  Medication Sig Dispense Refill  . atorvastatin (LIPITOR) 40 MG tablet Take 1 tablet (40 mg total) by mouth daily. 90 tablet 3  . carvedilol (COREG) 3.125 MG tablet TAKE 1 TABLET (3.125 MG TOTAL) BY MOUTH 2 (TWO) TIMES DAILY WITH A MEAL. 180 tablet 0  . clopidogrel (PLAVIX) 75 MG tablet TAKE 1 TABLET BY MOUTH EVERY DAY 90 tablet 3  . fluticasone (FLONASE) 50 MCG/ACT nasal spray PLACE 1 SPRAY INTO BOTH NOSTRILS 2 (TWO) TIMES DAILY 16 mL 2  . KRILL OIL PO Take 300 mg by mouth daily.     Marland Kitchen levocetirizine (XYZAL) 5 MG tablet TAKE 1 TABLET (5 MG TOTAL) BY MOUTH EVERY EVENING. FOR ALLERGIES. 90 tablet 0  . losartan (COZAAR) 50 MG tablet TAKE 1 TABLET (50 MG TOTAL) BY MOUTH DAILY. 90 tablet 3  . Multiple Vitamin (MULTIVITAMIN WITH MINERALS) TABS tablet Take 1 tablet by mouth daily.    . naproxen (NAPROSYN) 500 MG tablet Take 1 tablet (500 mg total) by mouth 2 (two) times daily with a meal. As needed for pain. 30 tablet 0  . pantoprazole (PROTONIX) 40 MG tablet TAKE 1 TABLET BY MOUTH EVERY DAY 90 tablet 1  . tiZANidine (ZANAFLEX) 4 MG tablet TAKE 1 TABLET (4 MG TOTAL) BY MOUTH EVERY 8 (EIGHT) HOURS AS NEEDED FOR MUSCLE SPASMS. 30 tablet 0   No current facility-administered medications on file prior to visit.     BP 120/76   Pulse 68   Temp 97.9 F (36.6 C) (Temporal)   Ht 5\' 8"  (1.727 m)   Wt 200 lb 4 oz (90.8 kg)   SpO2 98%   BMI 30.45 kg/m    Objective:   Physical Exam  Constitutional: He is oriented to person, place, and time. He appears well-nourished.  HENT:  Right Ear: Tympanic membrane and ear canal normal.  Left Ear: Tympanic membrane and ear canal normal.  Mouth/Throat: Oropharynx is clear and moist.  Eyes: Pupils are equal, round, and reactive to light. EOM are normal.  Neck: Neck supple.  Cardiovascular: Normal rate and regular rhythm.  Respiratory: Effort normal and breath sounds normal.  GI: Soft.  Bowel sounds are normal. There is no abdominal tenderness.  Musculoskeletal: Normal range of motion.  Neurological: He is alert and oriented to person, place, and time.  Skin: Skin is warm and dry.  Psychiatric: He has a normal mood and affect.           Assessment & Plan:

## 2019-02-23 NOTE — Assessment & Plan Note (Signed)
Stable in the office today. Continue carvedilol and losartan. CMP reviewed.

## 2019-02-23 NOTE — Assessment & Plan Note (Signed)
Doing well on PRN naproxen and tizanidine. Continue same.

## 2019-02-23 NOTE — Assessment & Plan Note (Signed)
New diagnosis with A1C of 6.5. Discussed treatment options. Start with metformin 500 mg tablets BID.  Managed on statin and ARB. Pneumonia vaccination UTD, due in 2021. Eye exam due, he will schedule. Foot exam next visit.  Follow up in 3 months.

## 2019-02-23 NOTE — Assessment & Plan Note (Signed)
Recent LDL at goal, continue statin therapy.

## 2019-02-23 NOTE — Assessment & Plan Note (Signed)
Doing well on Xyzal, continue same. 

## 2019-02-23 NOTE — Assessment & Plan Note (Signed)
Asymptomatic. Following with cardiology. Continue clopidogrel, beta blocker, BP control. Will now treat for diabetes.

## 2019-02-23 NOTE — Assessment & Plan Note (Signed)
Doing well on pantoprazole, continue same. 

## 2019-02-23 NOTE — Patient Instructions (Signed)
Start metformin 500 mg tablets for diabetes. Take 1 tablet by mouth with breakfast for two weeks, then increase to 1 tablet twice daily with breakfast and dinner thereafter.  Start exercising. You should be getting 150 minutes of moderate intensity exercise weekly.  It's important to improve your diet by reducing consumption of fast food, fried food, processed snack foods, sugary drinks. Increase consumption of fresh vegetables and fruits, whole grains, water.  Ensure you are drinking 64 ounces of water daily.  Schedule a follow up visit with me for 3 months for diabetes check.  It was a pleasure to see you today!   Preventive Care 19-83 Years Old, Male Preventive care refers to lifestyle choices and visits with your health care provider that can promote health and wellness. This includes:  A yearly physical exam. This is also called an annual well check.  Regular dental and eye exams.  Immunizations.  Screening for certain conditions.  Healthy lifestyle choices, such as eating a healthy diet, getting regular exercise, not using drugs or products that contain nicotine and tobacco, and limiting alcohol use. What can I expect for my preventive care visit? Physical exam Your health care provider will check:  Height and weight. These may be used to calculate body mass index (BMI), which is a measurement that tells if you are at a healthy weight.  Heart rate and blood pressure.  Your skin for abnormal spots. Counseling Your health care provider may ask you questions about:  Alcohol, tobacco, and drug use.  Emotional well-being.  Home and relationship well-being.  Sexual activity.  Eating habits.  Work and work Statistician. What immunizations do I need?  Influenza (flu) vaccine  This is recommended every year. Tetanus, diphtheria, and pertussis (Tdap) vaccine  You may need a Td booster every 10 years. Varicella (chickenpox) vaccine  You may need this vaccine if you  have not already been vaccinated. Zoster (shingles) vaccine  You may need this after age 74. Measles, mumps, and rubella (MMR) vaccine  You may need at least one dose of MMR if you were born in 1957 or later. You may also need a second dose. Pneumococcal conjugate (PCV13) vaccine  You may need this if you have certain conditions and were not previously vaccinated. Pneumococcal polysaccharide (PPSV23) vaccine  You may need one or two doses if you smoke cigarettes or if you have certain conditions. Meningococcal conjugate (MenACWY) vaccine  You may need this if you have certain conditions. Hepatitis A vaccine  You may need this if you have certain conditions or if you travel or work in places where you may be exposed to hepatitis A. Hepatitis B vaccine  You may need this if you have certain conditions or if you travel or work in places where you may be exposed to hepatitis B. Haemophilus influenzae type b (Hib) vaccine  You may need this if you have certain risk factors. Human papillomavirus (HPV) vaccine  If recommended by your health care provider, you may need three doses over 6 months. You may receive vaccines as individual doses or as more than one vaccine together in one shot (combination vaccines). Talk with your health care provider about the risks and benefits of combination vaccines. What tests do I need? Blood tests  Lipid and cholesterol levels. These may be checked every 5 years, or more frequently if you are over 38 years old.  Hepatitis C test.  Hepatitis B test. Screening  Lung cancer screening. You may have this screening every  year starting at age 45 if you have a 30-pack-year history of smoking and currently smoke or have quit within the past 15 years.  Prostate cancer screening. Recommendations will vary depending on your family history and other risks.  Colorectal cancer screening. All adults should have this screening starting at age 40 and continuing  until age 71. Your health care provider may recommend screening at age 12 if you are at increased risk. You will have tests every 1-10 years, depending on your results and the type of screening test.  Diabetes screening. This is done by checking your blood sugar (glucose) after you have not eaten for a while (fasting). You may have this done every 1-3 years.  Sexually transmitted disease (STD) testing. Follow these instructions at home: Eating and drinking  Eat a diet that includes fresh fruits and vegetables, whole grains, lean protein, and low-fat dairy products.  Take vitamin and mineral supplements as recommended by your health care provider.  Do not drink alcohol if your health care provider tells you not to drink.  If you drink alcohol: ? Limit how much you have to 0-2 drinks a day. ? Be aware of how much alcohol is in your drink. In the [place] one drink equals one 12 oz bottle of beer (355 mL), one 5 oz glass of wine (148 mL), or one 1 oz glass of hard liquor (44 mL). Lifestyle  Take daily care of your teeth and gums.  Stay active. Exercise for at least 30 minutes on 5 or more days each week.  Do not use any products that contain nicotine or tobacco, such as cigarettes, e-cigarettes, and chewing tobacco. If you need help quitting, ask your health care provider.  If you are sexually active, practice safe sex. Use a condom or other form of protection to prevent STIs (sexually transmitted infections).  Talk with your health care provider about taking a low-dose aspirin every day starting at age 44. What's next?  Go to your health care provider once a year for a well check visit.  Ask your health care provider how often you should have your eyes and teeth checked.  Stay up to date on all vaccines. This information is not intended to replace advice given to you by your health care provider. Make sure you discuss any questions you have with your health care provider. Document  Released: 05/27/2015 Document Revised: 04/24/2018 Document Reviewed: 04/24/2018 Elsevier Patient Education  2020 [employer].

## 2019-02-24 NOTE — Addendum Note (Signed)
Addended by: Jacqualin Combes on: 02/24/2019 09:58 AM   Modules accepted: Orders

## 2019-02-25 ENCOUNTER — Other Ambulatory Visit: Payer: Self-pay | Admitting: Primary Care

## 2019-02-25 DIAGNOSIS — M542 Cervicalgia: Secondary | ICD-10-CM

## 2019-02-26 ENCOUNTER — Other Ambulatory Visit: Payer: Self-pay

## 2019-02-26 DIAGNOSIS — Z20822 Contact with and (suspected) exposure to covid-19: Secondary | ICD-10-CM

## 2019-02-28 LAB — NOVEL CORONAVIRUS, NAA: SARS-CoV-2, NAA: NOT DETECTED

## 2019-03-03 ENCOUNTER — Other Ambulatory Visit: Payer: Self-pay | Admitting: Primary Care

## 2019-03-03 DIAGNOSIS — J302 Other seasonal allergic rhinitis: Secondary | ICD-10-CM

## 2019-03-14 ENCOUNTER — Other Ambulatory Visit: Payer: Self-pay | Admitting: Cardiovascular Disease

## 2019-03-15 ENCOUNTER — Other Ambulatory Visit: Payer: Self-pay | Admitting: Primary Care

## 2019-03-15 DIAGNOSIS — M542 Cervicalgia: Secondary | ICD-10-CM

## 2019-03-17 ENCOUNTER — Other Ambulatory Visit: Payer: Self-pay

## 2019-03-17 ENCOUNTER — Ambulatory Visit: Payer: BC Managed Care – PPO | Admitting: Primary Care

## 2019-03-17 ENCOUNTER — Encounter: Payer: Self-pay | Admitting: Primary Care

## 2019-03-17 VITALS — BP 120/80 | HR 64 | Temp 97.8°F | Ht 68.0 in | Wt 196.0 lb

## 2019-03-17 DIAGNOSIS — M778 Other enthesopathies, not elsewhere classified: Secondary | ICD-10-CM | POA: Diagnosis not present

## 2019-03-17 MED ORDER — PREDNISONE 10 MG PO TABS: ORAL_TABLET | ORAL | 0 refills | Status: DC

## 2019-03-17 NOTE — Progress Notes (Signed)
Subjective:    Patient ID: Alec Smith, male    DOB: [birth year], 63 y.o.   MRN: [id number]  HPI  Alec Smith is a 63 year old male with a history of hypertension, CAD, type 2 diabetes, arthralgia who presents today with a chief complaint of elbow pain.  His pain is located to the right lateral upper extremity to the elbow region with radiation down to the lateral and anterior mid forearm. Symptoms began 2 weeks ago after moving a lot of furniture to prepare for school. His pain is worse with flexion and movement of the upper extremity. He's been taking Naproxen for the last two weeks, using ice packs with temporary improvement.  He denies weakness, numbness/tingling, abrupt trauma.  Review of Systems  Musculoskeletal: Positive for arthralgias. Negative for joint swelling.  Skin: Negative for color change.  Neurological: Negative for weakness and numbness.       Past Medical History:  Diagnosis Date  . CAD (coronary artery disease)   . GERD (gastroesophageal reflux disease)   . Hx of adenomatous polyp of colon 02/09/2015  . Hyperlipidemia   . Hypertension      Social History   Socioeconomic History  . Marital status: Married    Spouse name: Not on file  . Number of children: 2  . Years of education: Not on file  . Highest education level: Not on file  Occupational History  . Occupation: school Technical brewer: [employer]  . Financial resource strain: Not on file  . Food insecurity    Worry: Not on file    Inability: Not on file  . Transportation needs    Medical: Not on file    Non-medical: Not on file  Tobacco Use  . Smoking status: Never Smoker  . Smokeless tobacco: Never Used  Substance and Sexual Activity  . Alcohol use: Yes    Alcohol/week: 0.0 standard drinks    Comment: once or twice a year  . Drug use: No  . Sexual activity: Not on file  Lifestyle  . Physical activity    Days per week: Not on file    Minutes per  session: Not on file  . Stress: Not on file  Relationships  . Social Herbalist on phone: Not on file    Gets together: Not on file    Attends religious service: Not on file    Active member of club or organization: Not on file    Attends meetings of clubs or organizations: Not on file    Relationship status: Not on file  . Intimate partner violence    Fear of current or ex partner: Not on file    Emotionally abused: Not on file    Physically abused: Not on file    Forced sexual activity: Not on file  Other Topics Concern  . Not on file  Social History Narrative   Married.   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   01/21/2015    Past Surgical History:  Procedure Laterality Date  . APPENDECTOMY  1966  . CORONARY ANGIOPLASTY WITH STENT PLACEMENT  2010  . LUMBAR DISC SURGERY  2000   Lower back  . PROSTATE BIOPSY    . TONSILLECTOMY     age 65    Family History  Problem Relation Age of Onset  . Aneurysm Mother  54       AAA  . Heart failure Father   . Prostate cancer Paternal Grandfather   . Vaginal cancer Paternal Grandmother        spread to lungs  . Colon cancer Neg Hx   . Esophageal cancer Neg Hx   . Stomach cancer Neg Hx   . Rectal cancer Neg Hx     No Known Allergies  Current Outpatient Medications on File Prior to Visit  Medication Sig Dispense Refill  . atorvastatin (LIPITOR) 40 MG tablet Take 1 tablet (40 mg total) by mouth daily. 90 tablet 3  . carvedilol (COREG) 3.125 MG tablet TAKE 1 TABLET (3.125 MG TOTAL) BY MOUTH 2 (TWO) TIMES DAILY WITH A MEAL. 180 tablet 3  . clopidogrel (PLAVIX) 75 MG tablet TAKE 1 TABLET BY MOUTH EVERY DAY 90 tablet 3  . fluticasone (FLONASE) 50 MCG/ACT nasal spray PLACE 1 SPRAY INTO BOTH NOSTRILS 2 (TWO) TIMES DAILY 16 mL 2  . KRILL OIL PO Take 300 mg by mouth daily.     Marland Kitchen levocetirizine (XYZAL) 5 MG tablet TAKE 1 TABLET (5 MG TOTAL) BY MOUTH EVERY EVENING.  FOR ALLERGIES. 90 tablet 1  . losartan (COZAAR) 50 MG tablet TAKE 1 TABLET (50 MG TOTAL) BY MOUTH DAILY. 90 tablet 3  . metFORMIN (GLUCOPHAGE) 500 MG tablet Take 1 tablet (500 mg total) by mouth 2 (two) times daily with a meal. For diabetes. 180 tablet 1  . Multiple Vitamin (MULTIVITAMIN WITH MINERALS) TABS tablet Take 1 tablet by mouth daily.    . naproxen (NAPROSYN) 500 MG tablet Take 1 tablet (500 mg total) by mouth 2 (two) times daily with a meal. As needed for pain. 30 tablet 0  . pantoprazole (PROTONIX) 40 MG tablet TAKE 1 TABLET BY MOUTH EVERY DAY 90 tablet 1  . tiZANidine (ZANAFLEX) 4 MG tablet Take 1 tablet (4 mg total) by mouth every 8 (eight) hours as needed for muscle spasms. 30 tablet 0   No current facility-administered medications on file prior to visit.     BP 120/80   Pulse 64   Temp 97.8 F (36.6 C) (Temporal)   Ht 5\' 8"  (1.727 m)   Wt 196 lb (88.9 kg)   SpO2 98%   BMI 29.80 kg/m    Objective:   Physical Exam  Constitutional: He appears well-nourished.  Cardiovascular: Normal rate.  Respiratory: Effort normal.  Musculoskeletal:     Right elbow: He exhibits normal range of motion and no swelling. Tenderness found.  Skin: No erythema.  Psychiatric: He has a normal mood and affect.           Assessment & Plan:

## 2019-03-17 NOTE — Patient Instructions (Signed)
Start prednisone tablets. Take three tablets for 2 days, then two tablets for 2 days, then one tablet for 2 days.  Stop taking naproxen tablets for inflammation while taking prednisone.   Continue ice and the brace as discussed.  It may take several weeks for healing.   It was a pleasure to see you today!   Tendinitis  Tendinitis is inflammation of a tendon. A tendon is a strong cord of tissue that connects muscle to bone. Tendinitis can affect any tendon, but it most commonly affects the:  Shoulder tendon (rotator cuff).  Ankle tendon (Achilles tendon).  Elbow tendon (triceps tendon).  Tendons in the wrist. What are the causes? This condition may be caused by:  Overusing a tendon or muscle. This is common.  Age-related wear and tear.  Injury.  Inflammatory conditions, such as arthritis.  Certain medicines. What increases the risk? You are more likely to develop this condition if you do activities that involve the same movements over and over again (repetitive motions). What are the signs or symptoms? Symptoms of this condition may include:  Pain.  Tenderness.  Mild swelling.  Decreased range of motion. How is this diagnosed? This condition is diagnosed with a physical exam. You may also have tests, such as:  Ultrasound. This uses sound waves to make an image of the inside of your body in the affected area.  MRI. How is this treated? This condition may be treated by resting, icing, applying pressure (compression), and raising (elevating) the affected area above the level of your heart. This is known as RICE therapy. Treatment may also include:  Medicines to help reduce inflammation or to help reduce pain.  Exercises or physical therapy to strengthen and stretch the tendon.  A brace or splint.  Surgery. This is rarely needed. Follow these instructions at home: If you have a splint or brace:  Wear the splint or brace as told by your health care  provider. Remove it only as told by your health care provider.  Loosen the splint or brace if your fingers or toes tingle, become numb, or turn cold and blue.  Keep the splint or brace clean.  If the splint or brace is not waterproof: ? Do not let it get wet. ? Cover it with a watertight covering when you take a bath or shower. Managing pain, stiffness, and swelling  If directed, put ice on the affected area. ? If you have a removable splint or brace, remove it as told by your health care provider. ? Put ice in a plastic bag. ? Place a towel between your skin and the bag. ? Leave the ice on for 20 minutes, 2-3 times a day.  Move the fingers or toes of the affected limb often, if this applies. This can help to prevent stiffness and lessen swelling.  If directed, raise (elevate) the affected area above the level of your heart while you are sitting or lying down.  If directed, apply heat to the affected area before you exercise. Use the heat source that your health care provider recommends, such as a moist heat pack or a heating pad.     ? Place a towel between your skin and the heat source. ? Leave the heat on for 20-30 minutes. ? Remove the heat if your skin turns bright red. This is especially important if you are unable to feel pain, heat, or cold. You may have a greater risk of getting burned. Driving  Do not drive or  use heavy machinery while taking prescription pain medicine.  Ask your health care provider when it is safe to drive if you have a splint or brace on any part of your arm or leg. Activity  Rest the affected area as told by your health care provider.  Return to your normal activities as told by your health care provider. Ask your health care provider what activities are safe for you.  Avoid using the affected area while you are experiencing symptoms of tendinitis.  Do exercises as told by your health care provider. General instructions  If you have a splint,  do not put pressure on any part of the splint until it is fully hardened. This may take several hours.  Wear an elastic bandage or compression wrap only as told by your health care provider.  Take over-the-counter and prescription medicines only as told by your health care provider.  Keep all follow-up visits as told by your health care provider. This is important. Contact a health care provider if:  Your symptoms do not improve.  You develop new, unexplained problems, such as numbness in your hands. Summary  Tendinitis is inflammation of a tendon.  You are more likely to develop this condition if you do activities that involve the same movements over and over again.  This condition may be treated by resting, icing, applying pressure (compression), and elevating the area above the level of your heart. This is known as RICE therapy.  Avoid using the affected area while you are experiencing symptoms of tendinitis. This information is not intended to replace advice given to you by your health care provider. Make sure you discuss any questions you have with your health care provider. Document Released: 04/27/2000 Document Revised: 11/05/2017 Document Reviewed: 09/18/2017 Elsevier Patient Education  2020 [employer].

## 2019-03-17 NOTE — Assessment & Plan Note (Signed)
Acute for the last two weeks, no abrupt trauma. Exam today overall stable. Suspect tendonitis from overuse of the joint from moving furniture, no obvious sepsis/cellulitis.  Given some improvement with naproxen temporarily along with radiation of pain, will trial steroid. Rx for prednisone taper sent. Discussed to hold NSAID's.   Ice, bracing, rest. Follow up PRN.

## 2019-03-24 ENCOUNTER — Other Ambulatory Visit: Payer: Self-pay | Admitting: Primary Care

## 2019-03-24 DIAGNOSIS — J302 Other seasonal allergic rhinitis: Secondary | ICD-10-CM

## 2019-04-06 ENCOUNTER — Ambulatory Visit: Payer: BC Managed Care – PPO | Admitting: Family Medicine

## 2019-04-06 ENCOUNTER — Encounter: Payer: Self-pay | Admitting: Family Medicine

## 2019-04-06 ENCOUNTER — Ambulatory Visit: Payer: BC Managed Care – PPO | Admitting: Primary Care

## 2019-04-06 ENCOUNTER — Other Ambulatory Visit: Payer: Self-pay

## 2019-04-06 VITALS — BP 120/70 | HR 64 | Temp 98.0°F | Ht 68.0 in | Wt 195.2 lb

## 2019-04-06 DIAGNOSIS — M778 Other enthesopathies, not elsewhere classified: Secondary | ICD-10-CM

## 2019-04-06 MED ORDER — METHYLPREDNISOLONE ACETATE 40 MG/ML IJ SUSP
20.0000 mg | Freq: Once | INTRAMUSCULAR | Status: AC
  Administered 2019-04-06: 20 mg via INTRA_ARTICULAR

## 2019-04-06 NOTE — Progress Notes (Signed)
Alec Hebard T. Alec Nordmann, MD Primary Care and Sports Medicine [facility] at [facility] [place], [address] Phone: [phone]  FAX: [place] - 63 y.o. male  MRN [id number]  Date of Birth: May 30, 1955  Visit Date: 04/06/2019  PCP: [name], NP  Referred by: [name], NP  Chief Complaint  Patient presents with  . Elbow Pain    RIght   Subjective:   Alec Smith presents with lateral elbow pain.  Length of symptoms: 2 mo Hand effected: R  Patient describes a dull ache on the lateral elbow. There is some translation in the proximal forearm and in the distal upper arm. It is painful to lift with the hand facing down and to lift with the thumb in an upright position. Supination is painful. Patient points to the lateral epicondyle as the point of maximal tenderness near ECRB.  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  Had 1 round of steroids  No trauma.   No prior fractures or operative interventions in the effective hand. Prior PT or HEP: none  Denies numbness or tingling. No significant neck or shoulder pain.  Hand of dominance:  The PMH, PSH, Social History, Family History, Medications, and allergies have been reviewed in [facility], and have been updated if relevant.  Patient Active Problem List   Diagnosis Date Noted  . Tendonitis of elbow, right 03/17/2019  . Seasonal allergies 09/16/2018  . Hx of adenomatous polyp of colon 02/09/2015  . Long term current use of antiplatelet agent clopidogrel 01/21/2015  . CAD S/P percutaneous coronary angioplasty/stents 01/21/2015  . Type 2 diabetes mellitus ([place]) 11/05/2014  . Insomnia 11/05/2014  . Preventative health care 11/05/2014  . Joint pain 10/05/2014  . Other fatigue 10/05/2014  . GERD (gastroesophageal reflux disease) 10/30/2013  . Facial burning 04/07/2012  . Disturbance of skin sensation  04/07/2012  . Mixed hyperlipidemia 07/29/2009  . Benign essential HTN 07/29/2009  . Atherosclerosis of coronary artery 07/29/2009    Past Medical History:  Diagnosis Date  . CAD (coronary artery disease)   . GERD (gastroesophageal reflux disease)   . Hx of adenomatous polyp of colon 02/09/2015  . Hyperlipidemia   . Hypertension     Past Surgical History:  Procedure Laterality Date  . APPENDECTOMY  1966  . CORONARY ANGIOPLASTY WITH STENT PLACEMENT  2010  . LUMBAR DISC SURGERY  2000   Lower back  . PROSTATE BIOPSY    . TONSILLECTOMY     age 34    Social History   Socioeconomic History  . Marital status: Married    Spouse name: Not on file  . Number of children: 2  . Years of education: Not on file  . Highest education level: Not on file  Occupational History  . 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 Financial resource strain: Not on file  . Food insecurity    Worry: Not on file    Inability: Not on file  . Transportation needs    Medical: Not on file    Non-medical: Not on file  Tobacco Use  . Smoking status: Never Smoker  . Smokeless tobacco: Never Used  Substance and Sexual Activity  . Alcohol use: Yes    Alcohol/week: 0.0 standard drinks    Comment: once or twice a year  . Drug use: No  .  Sexual activity: Not on file  Lifestyle  . Physical activity    Days per week: Not on file    Minutes per session: Not on file  . Stress: Not on file  Relationships  . Social Herbalist on phone: Not on file    Gets together: Not on file    Attends religious service: Not on file    Active member of club or organization: Not on file    Attends meetings of clubs or organizations: Not on file    Relationship status: Not on file  . Intimate partner violence    Fear of current or ex partner: Not on file    Emotionally abused: Not on file    Physically abused: Not on file    Forced sexual activity: Not on file  Other  Topics Concern  . Not on file  Social History Narrative   Married.   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   01/21/2015    Family History  Problem Relation Age of Onset  . Aneurysm Mother 57       AAA  . Heart failure Father   . Prostate cancer Paternal Grandfather   . Vaginal cancer Paternal Grandmother        spread to lungs  . Colon cancer Neg Hx   . Esophageal cancer Neg Hx   . Stomach cancer Neg Hx   . Rectal cancer Neg Hx     No Known Allergies  Medication list reviewed and updated in full in [place].  GEN: No fevers, chills. Nontoxic. Primarily MSK c/o today. MSK: Detailed in the HPI GI: tolerating PO intake without difficulty Neuro: No numbness, parasthesias, or tingling associated. Otherwise the pertinent positives of the ROS are noted above.   Objective:   Blood pressure 120/70, pulse 64, temperature 98 F (36.7 C), temperature source Temporal, height 5\' 8"  (1.727 m), weight 195 lb 4 oz (88.6 kg), SpO2 98 %.  GEN: Well-developed,well-nourished,in no acute distress; alert,appropriate and cooperative throughout examination HEENT: Normocephalic and atraumatic without obvious abnormalities. Ears, externally no deformities PULM: Breathing comfortably in no respiratory distress EXT: No clubbing, cyanosis, or edema PSYCH: Normally interactive. Cooperative during the interview. Pleasant. Friendly and conversant. Not anxious or depressed appearing. Normal, full affect.  R elbow Ecchymosis or edema: neg ROM: full flexion, extension, pronation, supination Shoulder ROM: Full Flexion: 5/5 Extension: 5/5, PAINFUL Supination: 5/5, PAINFUL Pronation: 5/5 Wrist ext: 5/5 Wrist flexion: 5/5 No gross bony abnormality Varus and Valgus stress: stable ECRB tenderness: YES, TTP Medial epicondyle: NT Lateral epicondyle, resisted wrist extension from wrist full pronation and flexion: PAINFUL grip: 5/5   sensation intact Tinel's, Elbow: negative  Subjective:     ICD-10-CM   1. Tendinitis of right elbow  M77.8 methylPREDNISolone acetate (DEPO-MEDROL) injection 20 mg   Elbow anatomy was reviewed, and tendinopathy was explained.  Pt. given a formal rehab program from [facility] on elbow rehabiliation.  Start off with isometrics and gentle stretching and ROM progressing to a series of concentric and eccentric exercises should be done starting with no weight, work up to 1 lb, hammer, etc.  Use counterforce strap if working or using hands.  Formal PT would be beneficial. Emphasized stretching an cross-friction massage Emphasized proper palms up lifting biomechanics to unload ECRB  Tendon Origin / Insertion Injection Procedure Note CAMP KREGER 10-05-1955 Date of procedure: 04/06/2019  Procedure: Tendon Origin / Insertion Injection for Lateral Epicondylitis, RIGHT Indications: Pain  Procedure Details Verbal consent was obtained from the patient. Risks, benefits, and alternatives were discussed. Potential complications including loss of pigment, atrophy, and rare risk of infection were discussed. Prepped with Chloraprep and Ethyl Chloride used for anesthesia. Under sterile conditions, the patient was injected at the point of maximal tenderness at the ECRB tendon with 2 cc of Lidocaine 1% and 1 cc of Depo-Medrol 40 mg. Decreased pain after injection. No complications.  Needle size: 22 gauge 1 1/2 inch Medication: Depo-Medrol 40 mg   Follow-up: No follow-ups on file.  Meds ordered this encounter  Medications  . methylPREDNISolone acetate (DEPO-MEDROL) injection 20 mg   No orders of the defined types were placed in this encounter.   Signed,  Maud Deed. Mamie Diiorio, MD   Patient's Medications  New Prescriptions   No medications on file  Previous Medications   ATORVASTATIN (LIPITOR) 40 MG TABLET    Take 1 tablet (40 mg total) by mouth daily.   CARVEDILOL (COREG) 3.125 MG TABLET     TAKE 1 TABLET (3.125 MG TOTAL) BY MOUTH 2 (TWO) TIMES DAILY WITH A MEAL.   CLOPIDOGREL (PLAVIX) 75 MG TABLET    TAKE 1 TABLET BY MOUTH EVERY DAY   FLUTICASONE (FLONASE) 50 MCG/ACT NASAL SPRAY    PLACE 1 SPRAY INTO BOTH NOSTRILS 2 (TWO) TIMES DAILY   KRILL OIL PO    Take 300 mg by mouth daily.    LEVOCETIRIZINE (XYZAL) 5 MG TABLET    TAKE 1 TABLET (5 MG TOTAL) BY MOUTH EVERY EVENING. FOR ALLERGIES.   LOSARTAN (COZAAR) 50 MG TABLET    TAKE 1 TABLET (50 MG TOTAL) BY MOUTH DAILY.   METFORMIN (GLUCOPHAGE) 500 MG TABLET    Take 1 tablet (500 mg total) by mouth 2 (two) times daily with a meal. For diabetes.   MULTIPLE VITAMIN (MULTIVITAMIN WITH MINERALS) TABS TABLET    Take 1 tablet by mouth daily.   NAPROXEN (NAPROSYN) 500 MG TABLET    TAKE 1 TABLET (500 MG TOTAL) BY MOUTH 2 (TWO) TIMES DAILY WITH A MEAL. AS NEEDED FOR PAIN.   PANTOPRAZOLE (PROTONIX) 40 MG TABLET    TAKE 1 TABLET BY MOUTH EVERY DAY   TIZANIDINE (ZANAFLEX) 4 MG TABLET    Take 1 tablet (4 mg total) by mouth every 8 (eight) hours as needed for muscle spasms.  Modified Medications   No medications on file  Discontinued Medications   PREDNISONE (DELTASONE) 10 MG TABLET    Take three tablets for 2 days, then two tablets for 2 days, then one tablet for 2 days.

## 2019-04-30 ENCOUNTER — Other Ambulatory Visit: Payer: Self-pay | Admitting: Cardiovascular Disease

## 2019-05-27 ENCOUNTER — Ambulatory Visit (INDEPENDENT_AMBULATORY_CARE_PROVIDER_SITE_OTHER): Payer: BC Managed Care – PPO | Admitting: Primary Care

## 2019-05-27 ENCOUNTER — Other Ambulatory Visit: Payer: Self-pay

## 2019-05-27 VITALS — BP 120/74 | HR 68 | Temp 96.6°F | Ht 68.0 in | Wt 191.8 lb

## 2019-05-27 DIAGNOSIS — E119 Type 2 diabetes mellitus without complications: Secondary | ICD-10-CM | POA: Diagnosis not present

## 2019-05-27 LAB — POCT GLYCOSYLATED HEMOGLOBIN (HGB A1C): Hemoglobin A1C: 6 % — AB (ref 4.0–5.6)

## 2019-05-27 NOTE — Assessment & Plan Note (Addendum)
Compliant to metformin, having no bad side effects. A1C today of 6.0.  Managed on ARB and statin. Foot exam today. He will schedule an eye exam later this year when due. Pneumonia vaccination due later this year.   Follow up in 6 months.

## 2019-05-27 NOTE — Patient Instructions (Addendum)
Continue taking Metformin twice daily for diabetes.  Continue to work on a healthy diet and be sure to exercise daily.  Schedule a lab only appointment for 6 months for diabetes check.   We'll see you back in the Fall for your annual physical.  It was a pleasure to see you today!   Diabetes Mellitus and Nutrition, Adult When you have diabetes (diabetes mellitus), it is very important to have healthy eating habits because your blood sugar (glucose) levels are greatly affected by what you eat and drink. Eating healthy foods in the appropriate amounts, at about the same times every day, can help you:  Control your blood glucose.  Lower your risk of heart disease.  Improve your blood pressure.  Reach or maintain a healthy weight. Every person with diabetes is different, and each person has different needs for a meal plan. Your health care provider may recommend that you work with a diet and nutrition specialist (dietitian) to make a meal plan that is best for you. Your meal plan may vary depending on factors such as:  The calories you need.  The medicines you take.  Your weight.  Your blood glucose, blood pressure, and cholesterol levels.  Your activity level.  Other health conditions you have, such as heart or kidney disease. How do carbohydrates affect me? Carbohydrates, also called carbs, affect your blood glucose level more than any other type of food. Eating carbs naturally raises the amount of glucose in your blood. Carb counting is a method for keeping track of how many carbs you eat. Counting carbs is important to keep your blood glucose at a healthy level, especially if you use insulin or take certain oral diabetes medicines. It is important to know how many carbs you can safely have in each meal. This is different for every person. Your dietitian can help you calculate how many carbs you should have at each meal and for each snack. Foods that contain carbs  include:  Bread, cereal, rice, pasta, and crackers.  Potatoes and corn.  Peas, beans, and lentils.  Milk and yogurt.  Fruit and juice.  Desserts, such as cakes, cookies, ice cream, and candy. How does alcohol affect me? Alcohol can cause a sudden decrease in blood glucose (hypoglycemia), especially if you use insulin or take certain oral diabetes medicines. Hypoglycemia can be a life-threatening condition. Symptoms of hypoglycemia (sleepiness, dizziness, and confusion) are similar to symptoms of having too much alcohol. If your health care provider says that alcohol is safe for you, follow these guidelines:  Limit alcohol intake to no more than 1 drink per day for nonpregnant women and 2 drinks per day for men. One drink equals 12 oz of beer, 5 oz of wine, or 1 oz of hard liquor.  Do not drink on an empty stomach.  Keep yourself hydrated with water, diet soda, or unsweetened iced tea.  Keep in mind that regular soda, juice, and other mixers may contain a lot of sugar and must be counted as carbs. What are tips for following this plan?  Reading food labels  Start by checking the serving size on the "Nutrition Facts" label of packaged foods and drinks. The amount of calories, carbs, fats, and other nutrients listed on the label is based on one serving of the item. Many items contain more than one serving per package.  Check the total grams (g) of carbs in one serving. You can calculate the number of servings of carbs in one serving by  dividing the total carbs by 15. For example, if a food has 30 g of total carbs, it would be equal to 2 servings of carbs.  Check the number of grams (g) of saturated and trans fats in one serving. Choose foods that have low or no amount of these fats.  Check the number of milligrams (mg) of salt (sodium) in one serving. Most people should limit total sodium intake to less than 2,300 mg per day.  Always check the nutrition information of foods labeled  as "low-fat" or "nonfat". These foods may be higher in added sugar or refined carbs and should be avoided.  Talk to your dietitian to identify your daily goals for nutrients listed on the label. Shopping  Avoid buying canned, premade, or processed foods. These foods tend to be high in fat, sodium, and added sugar.  Shop around the outside edge of the grocery store. This includes fresh fruits and vegetables, bulk grains, fresh meats, and fresh dairy. Cooking  Use low-heat cooking methods, such as baking, instead of high-heat cooking methods like deep frying.  Cook using healthy oils, such as olive, canola, or sunflower oil.  Avoid cooking with butter, cream, or high-fat meats. Meal planning  Eat meals and snacks regularly, preferably at the same times every day. Avoid going long periods of time without eating.  Eat foods high in fiber, such as fresh fruits, vegetables, beans, and whole grains. Talk to your dietitian about how many servings of carbs you can eat at each meal.  Eat 4-6 ounces (oz) of lean protein each day, such as lean meat, chicken, fish, eggs, or tofu. One oz of lean protein is equal to: ? 1 oz of meat, chicken, or fish. ? 1 egg. ?  cup of tofu.  Eat some foods each day that contain healthy fats, such as avocado, nuts, seeds, and fish. Lifestyle  Check your blood glucose regularly.  Exercise regularly as told by your health care provider. This may include: ? 150 minutes of moderate-intensity or vigorous-intensity exercise each week. This could be brisk walking, biking, or water aerobics. ? Stretching and doing strength exercises, such as yoga or weightlifting, at least 2 times a week.  Take medicines as told by your health care provider.  Do not use any products that contain nicotine or tobacco, such as cigarettes and e-cigarettes. If you need help quitting, ask your health care provider.  Work with a Social worker or diabetes educator to identify strategies to  manage stress and any emotional and social challenges. Questions to ask a health care provider  Do I need to meet with a diabetes educator?  Do I need to meet with a dietitian?  What number can I call if I have questions?  When are the best times to check my blood glucose? Where to find more information:  American Diabetes Association: diabetes.org  Academy of Nutrition and Dietetics: www.eatright.[employer] of Diabetes and Digestive and Kidney Diseases (NIH): [employer] Summary  A healthy meal plan will help you control your blood glucose and maintain a healthy lifestyle.  Working with a diet and nutrition specialist (dietitian) can help you make a meal plan that is best for you.  Keep in mind that carbohydrates (carbs) and alcohol have immediate effects on your blood glucose levels. It is important to count carbs and to use alcohol carefully. This information is not intended to replace advice given to you by your health care provider. Make sure you discuss any questions you have  with your health care provider. Document Revised: 04/12/2017 Document Reviewed: 06/04/2016 Elsevier Patient Education  2020 [employer].

## 2019-05-27 NOTE — Progress Notes (Signed)
Subjective:    Patient ID: Alec Smith, male    DOB: 11/03/1955, 64 y.o.   MRN: [id number]  HPI  This visit occurred during the SARS-CoV-2 public health emergency.  Safety protocols were in place, including screening questions prior to the visit, additional usage of staff PPE, and extensive cleaning of exam room while observing appropriate contact time as indicated for disinfecting solutions.   Alec Smith is a 64 year old male with a new diagnosis of type 2 diabetes, CAD, hypertension, hyperlipidemia who presents today for follow up of diabetes.  Current medications include: Metformin 500 mg BID  Last A1C: 6.5 in October 2020 Last Eye Exam: He will schedule.  Last Foot Exam: Completed today Pneumonia Vaccination: Completed Pneumovax in 2016 ACE/ARB: losartan  Statin: atorvastatin   BP Readings from Last 3 Encounters:  05/27/19 120/74  04/06/19 120/70  03/17/19 120/80   Wt Readings from Last 3 Encounters:  05/27/19 191 lb 12 oz (87 kg)  04/06/19 195 lb 4 oz (88.6 kg)  03/17/19 196 lb (88.9 kg)     Review of Systems  Eyes: Negative for visual disturbance.  Respiratory: Negative for shortness of breath.   Cardiovascular: Negative for chest pain.  Neurological: Negative for dizziness, numbness and headaches.       Past Medical History:  Diagnosis Date  . CAD (coronary artery disease)   . GERD (gastroesophageal reflux disease)   . Hx of adenomatous polyp of colon 02/09/2015  . Hyperlipidemia   . Hypertension      Social History   Socioeconomic History  . Marital status: Married    Spouse name: Not on file  . Number of children: 2  . Years of education: Not on file  . Highest education level: Not on file  Occupational History  . Occupation: school Technical brewer: [employer]  Tobacco Use  . Smoking status: Never Smoker  . Smokeless tobacco: Never Used  Substance and Sexual Activity  . Alcohol use: Yes    Alcohol/week: 0.0 standard drinks      Comment: once or twice a year  . Drug use: No  . Sexual activity: Not on file  Other Topics Concern  . Not on file  Social History Narrative   Married.   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   01/21/2015   Social Determinants of Health   Financial Resource Strain:   . Difficulty of Paying Living Expenses: Not on file  Food Insecurity:   . Worried About Charity fundraiser in the Last Year: Not on file  . Ran Out of Food in the Last Year: Not on file  Transportation Needs:   . Lack of Transportation (Medical): Not on file  . Lack of Transportation (Non-Medical): Not on file  Physical Activity:   . Days of Exercise per Week: Not on file  . Minutes of Exercise per Session: Not on file  Stress:   . Feeling of Stress : Not on file  Social Connections:   . Frequency of Communication with Friends and Family: Not on file  . Frequency of Social Gatherings with Friends and Family: Not on file  . Attends Religious Services: Not on file  . Active Member of Clubs or Organizations: Not on file  . Attends Archivist Meetings: Not on file  . Marital Status: Not on file  Intimate Partner Violence:   .  Fear of Current or Ex-Partner: Not on file  . Emotionally Abused: Not on file  . Physically Abused: Not on file  . Sexually Abused: Not on file    Past Surgical History:  Procedure Laterality Date  . APPENDECTOMY  1966  . CORONARY ANGIOPLASTY WITH STENT PLACEMENT  2010  . LUMBAR DISC SURGERY  2000   Lower back  . PROSTATE BIOPSY    . TONSILLECTOMY     age 67    Family History  Problem Relation Age of Onset  . Aneurysm Mother 42       AAA  . Heart failure Father   . Prostate cancer Paternal Grandfather   . Vaginal cancer Paternal Grandmother        spread to lungs  . Colon cancer Neg Hx   . Esophageal cancer Neg Hx   . Stomach cancer Neg Hx   . Rectal cancer Neg Hx     No Known Allergies  Current  Outpatient Medications on File Prior to Visit  Medication Sig Dispense Refill  . atorvastatin (LIPITOR) 40 MG tablet Take 1 tablet (40 mg total) by mouth daily. 90 tablet 3  . carvedilol (COREG) 3.125 MG tablet TAKE 1 TABLET (3.125 MG TOTAL) BY MOUTH 2 (TWO) TIMES DAILY WITH A MEAL. 180 tablet 3  . clopidogrel (PLAVIX) 75 MG tablet TAKE 1 TABLET BY MOUTH EVERY DAY 90 tablet 3  . fluticasone (FLONASE) 50 MCG/ACT nasal spray PLACE 1 SPRAY INTO BOTH NOSTRILS 2 (TWO) TIMES DAILY 48 mL 1  . KRILL OIL PO Take 300 mg by mouth daily.     Marland Kitchen levocetirizine (XYZAL) 5 MG tablet TAKE 1 TABLET (5 MG TOTAL) BY MOUTH EVERY EVENING. FOR ALLERGIES. 90 tablet 1  . losartan (COZAAR) 50 MG tablet TAKE 1 TABLET (50 MG TOTAL) BY MOUTH DAILY. 90 tablet 3  . metFORMIN (GLUCOPHAGE) 500 MG tablet Take 1 tablet (500 mg total) by mouth 2 (two) times daily with a meal. For diabetes. 180 tablet 1  . Multiple Vitamin (MULTIVITAMIN WITH MINERALS) TABS tablet Take 1 tablet by mouth daily.    . naproxen (NAPROSYN) 500 MG tablet TAKE 1 TABLET (500 MG TOTAL) BY MOUTH 2 (TWO) TIMES DAILY WITH A MEAL. AS NEEDED FOR PAIN. 30 tablet 0  . pantoprazole (PROTONIX) 40 MG tablet TAKE 1 TABLET BY MOUTH EVERY DAY 90 tablet 1  . tiZANidine (ZANAFLEX) 4 MG tablet Take 1 tablet (4 mg total) by mouth every 8 (eight) hours as needed for muscle spasms. 30 tablet 0   No current facility-administered medications on file prior to visit.    BP 120/74   Pulse 68   Temp (!) 96.6 F (35.9 C) (Temporal)   Ht 5\' 8"  (1.727 m)   Wt 191 lb 12 oz (87 kg)   SpO2 98%   BMI 29.16 kg/m    Objective:   Physical Exam  Constitutional: He appears well-nourished.  Cardiovascular: Normal rate and regular rhythm.  Respiratory: Effort normal and breath sounds normal.  Musculoskeletal:     Cervical back: Neck supple.  Skin: Skin is warm and dry.  Psychiatric: He has a normal mood and affect.           Assessment & Plan:

## 2019-06-09 ENCOUNTER — Other Ambulatory Visit: Payer: Self-pay | Admitting: Primary Care

## 2019-06-09 DIAGNOSIS — K219 Gastro-esophageal reflux disease without esophagitis: Secondary | ICD-10-CM

## 2019-06-15 ENCOUNTER — Ambulatory Visit: Payer: BC Managed Care – PPO | Admitting: Family Medicine

## 2019-06-17 ENCOUNTER — Encounter: Payer: Self-pay | Admitting: Family Medicine

## 2019-06-17 ENCOUNTER — Ambulatory Visit: Payer: BC Managed Care – PPO | Admitting: Family Medicine

## 2019-06-17 ENCOUNTER — Other Ambulatory Visit: Payer: Self-pay

## 2019-06-17 VITALS — BP 130/80 | HR 76 | Temp 97.8°F | Ht 68.0 in | Wt 193.5 lb

## 2019-06-17 DIAGNOSIS — M7711 Lateral epicondylitis, right elbow: Secondary | ICD-10-CM | POA: Diagnosis not present

## 2019-06-17 NOTE — Patient Instructions (Signed)
Bucket of sand or beans.  Put your hand in and move your fingers in every way possible.

## 2019-06-17 NOTE — Progress Notes (Signed)
Alec Jasper T. Broughton Eppinger, MD Primary Care and Sports Medicine [facility] at [facility] [place], [address] Phone: [phone]  FAX: [place] - 64 y.o. male  MRN [id number]  Date of Birth: 1956/04/16  Visit Date: 06/17/2019  PCP: [name], NP  Referred by: [name], NP  Chief Complaint  Patient presents with  . Elbow Pain    Right    This visit occurred during the SARS-CoV-2 public health emergency.  Safety protocols were in place, including screening questions prior to the visit, additional usage of staff PPE, and extensive cleaning of exam room while observing appropriate contact time as indicated for disinfecting solutions.   Subjective:   DAIR NECESSARY presents with lateral elbow pain.  Length of symptoms: 5 mo Hand effected: r  Patient describes a dull ache on the lateral elbow. There is some translation in the proximal forearm and in the distal upper arm. It is painful to lift with the hand facing down and to lift with the thumb in an upright position. Supination is painful. Patient points to the lateral epicondyle as the point of maximal tenderness near ECRB.  F/u LE, R.  03/2019. S/p inj LE.  I did do a lateral epicondylar injection at that point, he did have some relief of symptoms for several months.  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  No trauma.   No prior fractures or operative interventions in the effective hand. Prior PT or HEP: Home exercise program.  Denies numbness or tingling. No significant neck or shoulder pain.  Hand of dominance: Right  Review of Systems is noted in the HPI, as appropriate  Objective:   Blood pressure 130/80, pulse 76, temperature 97.8 F (36.6 C), temperature source Temporal, height 5\' 8"  (1.727 m), weight 193 lb 8 oz (87.8 kg), SpO2 99 %.  GEN: Well-developed,well-nourished,in no acute distress;  alert,appropriate and cooperative throughout examination HEENT: Normocephalic and atraumatic without obvious abnormalities. Ears, externally no deformities PULM: Breathing comfortably in no respiratory distress EXT: No clubbing, cyanosis, or edema PSYCH: Normally interactive. Cooperative during the interview. Pleasant. Friendly and conversant. Not anxious or depressed appearing. Normal, full affect.  r elbow Ecchymosis or edema: neg ROM: full flexion, extension, pronation, supination Shoulder ROM: Full Flexion: 5/5 Extension: 5/5, PAINFUL Supination: 5/5, PAINFUL Pronation: 5/5 Wrist ext: 5/5 Wrist flexion: 5/5 No gross bony abnormality Varus and Valgus stress: stable ECRB tenderness: YES, TTP Medial epicondyle: NT Lateral epicondyle, resisted wrist extension from wrist full pronation and flexion: PAINFUL grip: 5/5  sensation intact Tinel's, Elbow: negative  Subjective:     ICD-10-CM   1. Right lateral epicondylitis  M77.11 Ambulatory referral to Physical Therapy    Level of Medical Decision-Making in this case is low.  Elbow anatomy was reviewed, and tendinopathy was explained.  Pt. given a formal rehab program from [facility] on elbow rehabiliation.  I changed his rehab program, now concentrating on stretching his forearm, and I am also can have him use his hand for grip training in a bucket of sand or rice or beans.  Use counterforce strap if working or using hands.  Formal PT would be beneficial if symptoms persist, and I am going to have him do some formal PT and I am asking them also to do some iontophoresis.  With Decadron. Emphasized stretching an cross-friction massage Emphasized proper palms up lifting biomechanics to unload ECRB  Follow-up: No follow-ups on file.  No orders of the defined types were placed in this encounter.  Orders Placed This Encounter  Procedures  . Ambulatory referral to Physical Therapy    Signed,  [name],  MD   Patient's Medications  New Prescriptions   No medications on file  Previous Medications   ATORVASTATIN (LIPITOR) 40 MG TABLET    Take 1 tablet (40 mg total) by mouth daily.   CARVEDILOL (COREG) 3.125 MG TABLET    TAKE 1 TABLET (3.125 MG TOTAL) BY MOUTH 2 (TWO) TIMES DAILY WITH A MEAL.   CLOPIDOGREL (PLAVIX) 75 MG TABLET    TAKE 1 TABLET BY MOUTH EVERY DAY   FLUTICASONE (FLONASE) 50 MCG/ACT NASAL SPRAY    PLACE 1 SPRAY INTO BOTH NOSTRILS 2 (TWO) TIMES DAILY   KRILL OIL PO    Take 300 mg by mouth daily.    LEVOCETIRIZINE (XYZAL) 5 MG TABLET    TAKE 1 TABLET (5 MG TOTAL) BY MOUTH EVERY EVENING. FOR ALLERGIES.   LOSARTAN (COZAAR) 50 MG TABLET    TAKE 1 TABLET (50 MG TOTAL) BY MOUTH DAILY.   METFORMIN (GLUCOPHAGE) 500 MG TABLET    Take 1 tablet (500 mg total) by mouth 2 (two) times daily with a meal. For diabetes.   MULTIPLE VITAMIN (MULTIVITAMIN WITH MINERALS) TABS TABLET    Take 1 tablet by mouth daily.   NAPROXEN (NAPROSYN) 500 MG TABLET    TAKE 1 TABLET (500 MG TOTAL) BY MOUTH 2 (TWO) TIMES DAILY WITH A MEAL. AS NEEDED FOR PAIN.   PANTOPRAZOLE (PROTONIX) 40 MG TABLET    TAKE 1 TABLET BY MOUTH EVERY DAY   TIZANIDINE (ZANAFLEX) 4 MG TABLET    Take 1 tablet (4 mg total) by mouth every 8 (eight) hours as needed for muscle spasms.  Modified Medications   No medications on file  Discontinued Medications   No medications on file

## 2019-07-11 ENCOUNTER — Ambulatory Visit: Payer: BC Managed Care – PPO | Attending: Internal Medicine

## 2019-07-11 DIAGNOSIS — Z23 Encounter for immunization: Secondary | ICD-10-CM

## 2019-07-11 NOTE — Progress Notes (Signed)
   Covid-19 Vaccination Clinic  Name:  Alec Smith    MRN: [id number] DOB: [birth year]  07/11/2019  [name] was observed post Covid-19 immunization for 15 minutes without incidence. He was provided with Vaccine Information Sheet and instruction to access the V-Safe system.   [name] was instructed to call 911 with any severe reactions post vaccine: Marland Kitchen Difficulty breathing  . Swelling of your face and throat  . A fast heartbeat  . A bad rash all over your body  . Dizziness and weakness    Immunizations Administered    Name Date Dose VIS Date Route   Pfizer COVID-19 Vaccine 07/11/2019 12:12 PM 0.3 mL 04/24/2019 Intramuscular   Manufacturer: [place]   Lot: [id number]   [place]: [id number]

## 2019-08-01 ENCOUNTER — Ambulatory Visit: Payer: BC Managed Care – PPO | Attending: Internal Medicine

## 2019-08-01 DIAGNOSIS — Z23 Encounter for immunization: Secondary | ICD-10-CM

## 2019-08-01 NOTE — Progress Notes (Signed)
   Covid-19 Vaccination Clinic  Name:  Alec Smith    MRN: [id number] DOB: [birth year]  08/01/2019  [name] was observed post Covid-19 immunization for 15 minutes without incident. He was provided with Vaccine Information Sheet and instruction to access the V-Safe system.   [name] was instructed to call 911 with any severe reactions post vaccine: Marland Kitchen Difficulty breathing  . Swelling of face and throat  . A fast heartbeat  . A bad rash all over body  . Dizziness and weakness   Immunizations Administered    Name Date Dose VIS Date Route   Pfizer COVID-19 Vaccine 08/01/2019 12:10 PM 0.3 mL 04/24/2019 Intramuscular   Manufacturer: [place]   Lot: [id number]   [place]: [id number]

## 2019-08-05 ENCOUNTER — Ambulatory Visit: Payer: BC Managed Care – PPO

## 2019-08-14 ENCOUNTER — Other Ambulatory Visit: Payer: Self-pay | Admitting: Primary Care

## 2019-08-14 DIAGNOSIS — E119 Type 2 diabetes mellitus without complications: Secondary | ICD-10-CM

## 2019-09-05 ENCOUNTER — Other Ambulatory Visit: Payer: Self-pay | Admitting: Primary Care

## 2019-09-05 DIAGNOSIS — J302 Other seasonal allergic rhinitis: Secondary | ICD-10-CM

## 2019-09-22 ENCOUNTER — Other Ambulatory Visit: Payer: Self-pay | Admitting: Primary Care

## 2019-09-22 DIAGNOSIS — J302 Other seasonal allergic rhinitis: Secondary | ICD-10-CM

## 2019-11-20 LAB — PSA: PSA: 4.13

## 2019-12-01 ENCOUNTER — Other Ambulatory Visit: Payer: Self-pay | Admitting: Primary Care

## 2019-12-01 ENCOUNTER — Other Ambulatory Visit: Payer: Self-pay

## 2019-12-01 ENCOUNTER — Other Ambulatory Visit (INDEPENDENT_AMBULATORY_CARE_PROVIDER_SITE_OTHER): Payer: BC Managed Care – PPO

## 2019-12-01 DIAGNOSIS — E119 Type 2 diabetes mellitus without complications: Secondary | ICD-10-CM

## 2019-12-01 LAB — HEMOGLOBIN A1C: Hgb A1c MFr Bld: 6.1 % (ref 4.6–6.5)

## 2019-12-02 ENCOUNTER — Other Ambulatory Visit: Payer: Self-pay | Admitting: Primary Care

## 2019-12-02 DIAGNOSIS — K219 Gastro-esophageal reflux disease without esophagitis: Secondary | ICD-10-CM

## 2019-12-04 NOTE — Telephone Encounter (Signed)
Spoke with patient's husband via phone

## 2019-12-15 ENCOUNTER — Other Ambulatory Visit: Payer: Self-pay | Admitting: Primary Care

## 2019-12-15 DIAGNOSIS — E119 Type 2 diabetes mellitus without complications: Secondary | ICD-10-CM

## 2020-01-05 NOTE — Telephone Encounter (Signed)
Alec Smith, please get urine kit ready for Mr. Manville to pick up for Bev.

## 2020-01-06 NOTE — Telephone Encounter (Signed)
Left in the front for pick up

## 2020-01-09 NOTE — Progress Notes (Signed)
Patient ID: Alec Smith, male   DOB: [birth year], 64 y.o.   MRN: [id number] Cardiology Office Note  Date:  01/11/2020   ID:  Alec Smith, Alec Smith May 11, 1956, MRN [id number]  PCP:  Alec Koch, NP   Chief Complaint  Patient presents with  . other    12 month follow up. Meds reviewed by the pt. verbally. "doing well."     HPI:  Alec Smith is a pleasant 64 year old gentleman with history of  stress test October 21, 2008 showed anterior ischemia  Cath showing coronary artery disease,  stent placed to the proximal to mid LAD for occluded vessel,  residual RCA disease estimated at 60% with intervention performed in 2010.  He presents for follow-up of his coronary artery disease  taking metformin 500 BID Has upset stomach lost weight, stress with wife at home  Stress at home, wife with parkinsons lewy body Delusions Sees Dr. Manuella Smith She is falling  Plans on retiring next year Taking time off from work No exercise  Labs: Hemoglobin A1c 6.1 Total cholesterol 110 LDL 61 Creatinine 1.1, BUN 27  EKG personally reviewed by myself on todays visit shows normal sinus rhythm with rate 58 bpm, no significant ST or T-wave changes  Cath report:  2010  a 1.5 x 10 apex crosswire  was used to ultimately gain access into the distal vessel.  This was  used to then dilate the chronically occluded segment.  A 2.0 x 30 apex  was then used to further dilate the segment from the point of distal  occlusion up to the point of proximal occlusion.  Overlapping 2.25 x 32  Taxus Adam drug-eluting stents were then deployed beginning distally and  then working proximally.  This was followed by 2.25 x 16 Taxus Adam  deployed at 16 and 18 atmospheres.  A 3.0 and 12 Taxus was then deployed  in the proximal LAD  that had 90% stenosis and the overlapping segment  was stented with a 2.75 x 8 Taxus.  The entire 2.25 segment was then  postdilated with a 2.5 x 25 [place] Voyager up to 16 and 18 atmospheres,  the  more proximal segment was postdilated with a 2.75 x a 15 [place] Voyager and  the most proximal segment where the 3.0 stent was deployed was  postdilated with a 3.25 x 12  Voyager.  PMH:   has a past medical history of CAD (coronary artery disease), GERD (gastroesophageal reflux disease), adenomatous polyp of colon (02/09/2015), Hyperlipidemia, and Hypertension.  PSH:    Past Surgical History:  Procedure Laterality Date  . APPENDECTOMY  1966  . CORONARY ANGIOPLASTY WITH STENT PLACEMENT  2010  . LUMBAR DISC SURGERY  2000   Lower back  . PROSTATE BIOPSY    . TONSILLECTOMY     age 64    Current Outpatient Medications  Medication Sig Dispense Refill  . atorvastatin (LIPITOR) 40 MG tablet Take 1 tablet (40 mg total) by mouth daily. 90 tablet 3  . carvedilol (COREG) 3.125 MG tablet TAKE 1 TABLET (3.125 MG TOTAL) BY MOUTH 2 (TWO) TIMES DAILY WITH A MEAL. 180 tablet 3  . clopidogrel (PLAVIX) 75 MG tablet TAKE 1 TABLET BY MOUTH EVERY DAY 90 tablet 3  . fluticasone (FLONASE) 50 MCG/ACT nasal spray PLACE 1 SPRAY INTO BOTH NOSTRILS 2 (TWO) TIMES DAILY 48 mL 1  . KRILL OIL PO Take 300 mg by mouth daily.     Marland Kitchen levocetirizine (XYZAL) 5 MG tablet TAKE 1  TABLET (5 MG TOTAL) BY MOUTH EVERY EVENING. FOR ALLERGIES. 90 tablet 1  . losartan (COZAAR) 50 MG tablet TAKE 1 TABLET (50 MG TOTAL) BY MOUTH DAILY. 90 tablet 3  . metFORMIN (GLUCOPHAGE) 500 MG tablet TAKE 1 TABLET (500 MG TOTAL) BY MOUTH 2 (TWO) TIMES DAILY WITH A MEAL. FOR DIABETES. 180 tablet 1  . Multiple Vitamin (MULTIVITAMIN WITH MINERALS) TABS tablet Take 1 tablet by mouth daily.    . naproxen (NAPROSYN) 500 MG tablet TAKE 1 TABLET (500 MG TOTAL) BY MOUTH 2 (TWO) TIMES DAILY WITH A MEAL. AS NEEDED FOR PAIN. 30 tablet 0  . pantoprazole (PROTONIX) 40 MG tablet TAKE 1 TABLET BY MOUTH EVERY DAY 90 tablet 1  . tiZANidine (ZANAFLEX) 4 MG tablet Take 1 tablet (4 mg total) by mouth every 8 (eight) hours as needed for muscle spasms. 30 tablet 0   No  current facility-administered medications for this visit.    Allergies:   Patient has no known allergies.   Social History:  The patient  reports that he has never smoked. He has never used smokeless tobacco. He reports current alcohol use. He reports that he does not use drugs.   Family History:   family history includes Aneurysm (age of onset: 71) in his mother; Heart failure in his father; Prostate cancer in his paternal grandfather; Vaginal cancer in his paternal grandmother.    Review of Systems: Review of Systems  Constitutional: Negative.   Respiratory: Negative.   Cardiovascular: Negative.   Gastrointestinal: Negative.   Musculoskeletal: Negative.   Neurological: Negative.   Psychiatric/Behavioral: Negative.   All other systems reviewed and are negative.   PHYSICAL EXAM: VS:  BP 120/70 (BP Location: Left Arm, Patient Position: Sitting, Cuff Size: Normal)   Pulse (!) 58   Ht 5\' 8"  (1.727 m)   Wt 187 lb (84.8 kg)   SpO2 98%   BMI 28.43 kg/m  , BMI Body mass index is 28.43 kg/m.   Recent Labs: 02/20/2019: ALT 23; BUN 27; Creatinine, Ser 1.10; Potassium 4.3; Sodium 139    Lipid Panel Lab Results  Component Value Date   CHOL 110 02/20/2019   HDL 36.00 (L) 02/20/2019   LDLCALC 61 02/20/2019   TRIG 62.0 02/20/2019      Wt Readings from Last 3 Encounters:  01/11/20 187 lb (84.8 kg)  06/17/19 193 lb 8 oz (87.8 kg)  05/27/19 191 lb 12 oz (87 kg)      ASSESSMENT AND PLAN:  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 No further workup at this time. Continue current medication regimen. stable  Benign essential HTN -  Blood pressure is well controlled on today's visit. No changes made to the medications.  Hyperlipidemia Cholesterol is at goal on the current lipid regimen. No changes to the medications were made.  CAD S/P percutaneous coronary  angioplasty/stents Currently with no symptoms of angina. No further workup at this time. Continue current medication regimen.  Stable  Adjustment disorder/stress Wife with diagnosis of Lewy body Long discussion concerning need for family supports   Borderline diabetes Recommended dietary changes, exercise program    Total encounter time more than 25 minutes  Greater than 50% was spent in counseling and coordination of care with the patient     Orders Placed This Encounter  Procedures  . EKG 12-Lead     Signed, Esmond Plants, M.D., Ph.D. 01/11/2020  [place]  [place], [place]

## 2020-01-11 ENCOUNTER — Other Ambulatory Visit: Payer: Self-pay

## 2020-01-11 ENCOUNTER — Encounter: Payer: Self-pay | Admitting: Cardiovascular Disease

## 2020-01-11 ENCOUNTER — Ambulatory Visit: Payer: BC Managed Care – PPO | Admitting: Cardiovascular Disease

## 2020-01-11 VITALS — BP 120/70 | HR 58 | Ht 68.0 in | Wt 187.0 lb

## 2020-01-11 DIAGNOSIS — I25118 Atherosclerotic heart disease of native coronary artery with other forms of angina pectoris: Secondary | ICD-10-CM

## 2020-01-11 DIAGNOSIS — E1159 Type 2 diabetes mellitus with other circulatory complications: Secondary | ICD-10-CM

## 2020-01-11 NOTE — Patient Instructions (Signed)
Medication Instructions:  No changes  If you need a refill on your cardiac medications before your next appointment, please call your pharmacy.    Lab work: No new labs needed   If you have labs (blood work) drawn today and your tests are completely normal, you will receive your results only by: . MyChart Message (if you have MyChart) OR . A paper copy in the mail If you have any lab test that is abnormal or we need to change your treatment, we will call you to review the results.   Testing/Procedures: No new testing needed   Follow-Up: At [facility], you and your health needs are our priority.  As part of our continuing mission to provide you with exceptional heart care, we have created designated Provider Care Teams.  These Care Teams include your primary Cardiologist (physician) and Advanced Practice Providers (APPs -  Physician Assistants and Nurse Practitioners) who all work together to provide you with the care you need, when you need it.  . You will need a follow up appointment in 12 months  . Providers on your designated Care Team:   . Christopher Berge, NP . Ryan Dunn, PA-C . Jacquelyn Visser, PA-C  Any Other Special Instructions Will Be Listed Below (If Applicable).  COVID-19 Vaccine Information can be found at: [email or web address] For questions related to vaccine distribution or appointments, please email [email or web address] or call [phone].     

## 2020-01-14 ENCOUNTER — Other Ambulatory Visit: Payer: Self-pay | Admitting: Cardiovascular Disease

## 2020-01-15 ENCOUNTER — Other Ambulatory Visit: Payer: Self-pay | Admitting: *Deleted

## 2020-01-15 MED ORDER — LOSARTAN POTASSIUM 50 MG PO TABS
50.0000 mg | ORAL_TABLET | Freq: Every day | ORAL | 3 refills | Status: DC
Start: 2020-01-15 — End: 2020-10-06

## 2020-01-28 ENCOUNTER — Other Ambulatory Visit: Payer: Self-pay | Admitting: Cardiovascular Disease

## 2020-01-30 ENCOUNTER — Other Ambulatory Visit: Payer: Self-pay | Admitting: Cardiovascular Disease

## 2020-02-01 ENCOUNTER — Other Ambulatory Visit: Payer: Self-pay | Admitting: Cardiovascular Disease

## 2020-02-09 ENCOUNTER — Ambulatory Visit (INDEPENDENT_AMBULATORY_CARE_PROVIDER_SITE_OTHER): Payer: BC Managed Care – PPO

## 2020-02-09 ENCOUNTER — Other Ambulatory Visit: Payer: Self-pay

## 2020-02-09 DIAGNOSIS — Z23 Encounter for immunization: Secondary | ICD-10-CM

## 2020-02-11 ENCOUNTER — Ambulatory Visit: Payer: Self-pay

## 2020-02-15 ENCOUNTER — Other Ambulatory Visit: Payer: Self-pay | Admitting: Primary Care

## 2020-02-15 DIAGNOSIS — E119 Type 2 diabetes mellitus without complications: Secondary | ICD-10-CM

## 2020-02-15 DIAGNOSIS — E782 Mixed hyperlipidemia: Secondary | ICD-10-CM

## 2020-02-15 DIAGNOSIS — Z125 Encounter for screening for malignant neoplasm of prostate: Secondary | ICD-10-CM

## 2020-02-15 DIAGNOSIS — I10 Essential (primary) hypertension: Secondary | ICD-10-CM

## 2020-02-15 DIAGNOSIS — Z114 Encounter for screening for human immunodeficiency virus [HIV]: Secondary | ICD-10-CM

## 2020-02-22 ENCOUNTER — Other Ambulatory Visit: Payer: Self-pay

## 2020-02-22 ENCOUNTER — Other Ambulatory Visit (INDEPENDENT_AMBULATORY_CARE_PROVIDER_SITE_OTHER): Payer: Medicare Other

## 2020-02-22 DIAGNOSIS — E782 Mixed hyperlipidemia: Secondary | ICD-10-CM | POA: Diagnosis not present

## 2020-02-22 DIAGNOSIS — Z125 Encounter for screening for malignant neoplasm of prostate: Secondary | ICD-10-CM | POA: Diagnosis not present

## 2020-02-22 DIAGNOSIS — E119 Type 2 diabetes mellitus without complications: Secondary | ICD-10-CM

## 2020-02-22 DIAGNOSIS — I10 Essential (primary) hypertension: Secondary | ICD-10-CM

## 2020-02-22 DIAGNOSIS — Z114 Encounter for screening for human immunodeficiency virus [HIV]: Secondary | ICD-10-CM

## 2020-02-22 LAB — PSA: PSA: 3.49 ng/mL (ref 0.10–4.00)

## 2020-02-22 LAB — COMPREHENSIVE METABOLIC PANEL
ALT: 27 U/L (ref 0–53)
AST: 18 U/L (ref 0–37)
Albumin: 4.3 g/dL (ref 3.5–5.2)
Alkaline Phosphatase: 72 U/L (ref 39–117)
BUN: 25 mg/dL — ABNORMAL HIGH (ref 6–23)
CO2: 29 mEq/L (ref 19–32)
Calcium: 9.2 mg/dL (ref 8.4–10.5)
Chloride: 103 mEq/L (ref 96–112)
Creatinine, Ser: 1.09 mg/dL (ref 0.40–1.50)
GFR: 70.81 mL/min (ref >60.00)
Glucose, Bld: 107 mg/dL — ABNORMAL HIGH (ref 70–99)
Potassium: 4.4 mEq/L (ref 3.5–5.1)
Sodium: 138 mEq/L (ref 135–145)
Total Bilirubin: 0.6 mg/dL (ref 0.2–1.2)
Total Protein: 6.7 g/dL (ref 6.0–8.3)

## 2020-02-22 LAB — LIPID PANEL
Cholesterol: 131 mg/dL (ref 0–200)
HDL: 37.1 mg/dL — ABNORMAL LOW (ref >39.00)
LDL Cholesterol: 73 mg/dL (ref 0–99)
NonHDL: 93.66
Total CHOL/HDL Ratio: 4
Triglycerides: 104 mg/dL (ref 0.0–149.0)
VLDL: 20.8 mg/dL (ref 0.0–40.0)

## 2020-02-22 LAB — HEMOGLOBIN A1C: Hgb A1c MFr Bld: 6.4 % (ref 4.6–6.5)

## 2020-02-22 LAB — CBC
HCT: 44.2 % (ref 39.0–52.0)
Hemoglobin: 14.6 g/dL (ref 13.0–17.0)
MCHC: 33.1 g/dL (ref 30.0–36.0)
MCV: 89.6 fl (ref 78.0–100.0)
Platelets: 187 10*3/uL (ref 150.0–400.0)
RBC: 4.93 Mil/uL (ref 4.22–5.81)
RDW: 14.1 % (ref 11.5–15.5)
WBC: 6.7 10*3/uL (ref 4.0–10.5)

## 2020-02-23 LAB — HIV ANTIBODY (ROUTINE TESTING W REFLEX): HIV 1&2 Ab, 4th Generation: NONREACTIVE

## 2020-02-25 ENCOUNTER — Encounter: Payer: Self-pay | Admitting: Primary Care

## 2020-02-25 ENCOUNTER — Other Ambulatory Visit: Payer: Self-pay

## 2020-02-25 ENCOUNTER — Ambulatory Visit (INDEPENDENT_AMBULATORY_CARE_PROVIDER_SITE_OTHER): Payer: Medicare Other | Admitting: Primary Care

## 2020-02-25 VITALS — BP 118/68 | HR 72 | Temp 97.5°F | Ht 68.0 in | Wt 188.0 lb

## 2020-02-25 DIAGNOSIS — K219 Gastro-esophageal reflux disease without esophagitis: Secondary | ICD-10-CM

## 2020-02-25 DIAGNOSIS — Z0000 Encounter for general adult medical examination without abnormal findings: Secondary | ICD-10-CM | POA: Diagnosis not present

## 2020-02-25 DIAGNOSIS — E782 Mixed hyperlipidemia: Secondary | ICD-10-CM

## 2020-02-25 DIAGNOSIS — Z23 Encounter for immunization: Secondary | ICD-10-CM | POA: Diagnosis not present

## 2020-02-25 DIAGNOSIS — Z86010 Personal history of colonic polyps: Secondary | ICD-10-CM

## 2020-02-25 DIAGNOSIS — I10 Essential (primary) hypertension: Secondary | ICD-10-CM

## 2020-02-25 DIAGNOSIS — Z9861 Coronary angioplasty status: Secondary | ICD-10-CM

## 2020-02-25 DIAGNOSIS — E119 Type 2 diabetes mellitus without complications: Secondary | ICD-10-CM | POA: Diagnosis not present

## 2020-02-25 DIAGNOSIS — I2510 Atherosclerotic heart disease of native coronary artery without angina pectoris: Secondary | ICD-10-CM

## 2020-02-25 DIAGNOSIS — Z1211 Encounter for screening for malignant neoplasm of colon: Secondary | ICD-10-CM | POA: Diagnosis not present

## 2020-02-25 DIAGNOSIS — J302 Other seasonal allergic rhinitis: Secondary | ICD-10-CM

## 2020-02-25 MED ORDER — METFORMIN HCL ER 500 MG PO TB24: 500.0000 mg | ORAL_TABLET | Freq: Every day | ORAL | 3 refills | Status: DC

## 2020-02-25 NOTE — Progress Notes (Signed)
Patient ID: Alec Smith, male   DOB: Jan 15, 1956, 64 y.o.   MRN: [id number]  Alec Smith is a 64 year old male who presents today for Welcome to Medicare Visit.  HPI:  Past Medical History:  Diagnosis Date  . CAD (coronary artery disease)   . GERD (gastroesophageal reflux disease)   . Hx of adenomatous polyp of colon 02/09/2015  . Hyperlipidemia   . Hypertension     Current Outpatient Medications  Medication Sig Dispense Refill  . atorvastatin (LIPITOR) 40 MG tablet TAKE 1 TABLET BY MOUTH EVERY DAY 90 tablet 3  . carvedilol (COREG) 3.125 MG tablet TAKE 1 TABLET (3.125 MG TOTAL) BY MOUTH 2 (TWO) TIMES DAILY WITH A MEAL. 180 tablet 3  . clopidogrel (PLAVIX) 75 MG tablet TAKE 1 TABLET BY MOUTH EVERY DAY 90 tablet 3  . fluticasone (FLONASE) 50 MCG/ACT nasal spray PLACE 1 SPRAY INTO BOTH NOSTRILS 2 (TWO) TIMES DAILY 48 mL 1  . KRILL OIL PO Take 300 mg by mouth daily.     Marland Kitchen levocetirizine (XYZAL) 5 MG tablet TAKE 1 TABLET (5 MG TOTAL) BY MOUTH EVERY EVENING. FOR ALLERGIES. 90 tablet 1  . losartan (COZAAR) 50 MG tablet Take 1 tablet (50 mg total) by mouth daily. 90 tablet 3  . metFORMIN (GLUCOPHAGE) 500 MG tablet TAKE 1 TABLET (500 MG TOTAL) BY MOUTH 2 (TWO) TIMES DAILY WITH A MEAL. FOR DIABETES. 180 tablet 1  . Multiple Vitamin (MULTIVITAMIN WITH MINERALS) TABS tablet Take 1 tablet by mouth daily.    . naproxen (NAPROSYN) 500 MG tablet TAKE 1 TABLET (500 MG TOTAL) BY MOUTH 2 (TWO) TIMES DAILY WITH A MEAL. AS NEEDED FOR PAIN. 30 tablet 0  . pantoprazole (PROTONIX) 40 MG tablet TAKE 1 TABLET BY MOUTH EVERY DAY 90 tablet 1  . tiZANidine (ZANAFLEX) 4 MG tablet Take 1 tablet (4 mg total) by mouth every 8 (eight) hours as needed for muscle spasms. 30 tablet 0   No current facility-administered medications for this visit.    No Known Allergies  Family History  Problem Relation Age of Onset  . Aneurysm Mother 68       AAA  . Heart failure Father   . Prostate cancer Paternal Grandfather   .  Vaginal cancer Paternal Grandmother        spread to lungs  . Colon cancer Neg Hx   . Esophageal cancer Neg Hx   . Stomach cancer Neg Hx   . Rectal cancer Neg Hx     Social History   Socioeconomic History  . Marital status: Married    Spouse name: Not on file  . Number of children: 2  . Years of education: Not on file  . Highest education level: Not on file  Occupational History  . Occupation: school Technical brewer: [employer]  Tobacco Use  . Smoking status: Never Smoker  . Smokeless tobacco: Never Used  Vaping Use  . Vaping Use: Never used  Substance and Sexual Activity  . Alcohol use: Yes    Alcohol/week: 0.0 standard drinks    Comment: once or twice a year  . Drug use: No  . Sexual activity: Not on file  Other Topics Concern  . Not on file  Social History Narrative   Married.   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   01/21/2015   Social Determinants of Health  Financial Resource Strain:   . Difficulty of Paying Living Expenses: Not on file  Food Insecurity:   . Worried About Charity fundraiser in the Last Year: Not on file  . Ran Out of Food in the Last Year: Not on file  Transportation Needs:   . Lack of Transportation (Medical): Not on file  . Lack of Transportation (Non-Medical): Not on file  Physical Activity:   . Days of Exercise per Week: Not on file  . Minutes of Exercise per Session: Not on file  Stress:   . Feeling of Stress : Not on file  Social Connections:   . Frequency of Communication with Friends and Family: Not on file  . Frequency of Social Gatherings with Friends and Family: Not on file  . Attends Religious Services: Not on file  . Active Member of Clubs or Organizations: Not on file  . Attends Archivist Meetings: Not on file  . Marital Status: Not on file  Intimate Partner Violence:   . Fear of Current or Ex-Partner: Not on file  . Emotionally  Abused: Not on file  . Physically Abused: Not on file  . Sexually Abused: Not on file    Hospitiliaztions: None.  Health Maintenance:    Flu: Completed this season   Tetanus: Completed in 2019  Pneumovax: Completed in 2061  Prevnar: Due today  Zostavax: Completed in 2016. Also completed Shingrix in 2020  Colonoscopy: Completed in 2016, due in 2021  Eye Doctor: Due later this year  Dental Exam: Completes annually   PSA: 3.49 in 2021  Hep C Screening: Negative    Providers: Dr. Rockey Situ, Cardiology; Alma Friendly, PCP; Dr. Mar Daring, Urology   I have personally reviewed and have noted: 1. The patient's medical and social history 2. Their use of alcohol, tobacco or illicit drugs 3. Their current medications and supplements 4. The patient's functional ability including ADL's, fall risks, home  safety risks and hearing or visual impairment. 5. Diet and physical activities 6. Evidence for depression or mood disorder  Subjective:   Review of Systems:   Constitutional: Denies fever, malaise, fatigue, headache or abrupt weight changes.  HEENT: Denies eye pain, eye redness, ear pain, ringing in the ears, wax buildup, runny nose, nasal congestion, bloody nose, or sore throat. Respiratory: Denies difficulty breathing, shortness of breath, cough or sputum production.   Cardiovascular: Denies chest pain, chest tightness, palpitations or swelling in the hands or feet.  Gastrointestinal: Denies abdominal pain, bloating, constipation, diarrhea or blood in the stool.  GU: Denies urgency, frequency, pain with urination, burning sensation, blood in urine, odor or discharge. Musculoskeletal: Denies decrease in range of motion, difficulty with gait, muscle pain or joint pain and swelling.  Skin: Denies redness, rashes, lesions or ulcercations.  Neurological: Denies dizziness, difficulty with memory, difficulty with speech or problems with balance and coordination.  Psychiatric: Denies concerns  for anxiety or depression.   No other specific complaints in a complete review of systems (except as listed in HPI above).  Objective:  PE:   BP 118/68   Pulse 72   Temp (!) 97.5 F (36.4 C) (Temporal)   Ht 5\' 8"  (1.727 m)   Wt 188 lb (85.3 kg)   SpO2 98%   BMI 28.59 kg/m  Wt Readings from Last 3 Encounters:  02/25/20 188 lb (85.3 kg)  01/11/20 187 lb (84.8 kg)  06/17/19 193 lb 8 oz (87.8 kg)    General: Appears their stated age, well developed, well  nourished in NAD. Skin: Warm, dry and intact. No rashes, lesions or ulcerations noted. HEENT: Head: normal shape and size; Eyes: sclera white, no icterus, conjunctiva pink, PERRLA and EOMs intact; Ears: Tm's gray and intact, normal light reflex; Nose: mucosa pink and moist, septum midline; Throat/Mouth: Teeth present, mucosa pink and moist, no exudate, lesions or ulcerations noted.  Neck: Normal range of motion. Neck supple, trachea midline. No massses, lumps or thyromegaly present.  Cardiovascular: Normal rate and rhythm. S1,S2 noted.  No murmur, rubs or gallops noted. No JVD or BLE edema. No carotid bruits noted. Pulmonary/Chest: Normal effort and positive vesicular breath sounds. No respiratory distress. No wheezes, rales or ronchi noted.  Abdomen: Soft and nontender. Normal bowel sounds, no bruits noted. No distention or masses noted. Liver, spleen and kidneys non palpable. Musculoskeletal: Normal range of motion. No signs of joint swelling. No difficulty with gait.  Neurological: Alert and oriented. Cranial nerves II-XII intact. Coordination normal. +DTRs bilaterally. Psychiatric: Mood and affect normal. Behavior is normal. Judgment and thought content normal.   He's been taking his Metformin once nightly due to GI upset.   BMET    Component Value Date/Time   NA 138 02/22/2020 0736   K 4.4 02/22/2020 0736   CL 103 02/22/2020 0736   CO2 29 02/22/2020 0736   GLUCOSE 107 (H) 02/22/2020 0736   BUN 25 (H) 02/22/2020 0736    CREATININE 1.09 02/22/2020 0736   CALCIUM 9.2 02/22/2020 0736   GFRNONAA >60 02/25/2015 1559   GFRAA >60 02/25/2015 1559    Lipid Panel     Component Value Date/Time   CHOL 131 02/22/2020 0736   CHOL 117 10/31/2017 1215   TRIG 104.0 02/22/2020 0736   TRIG 99 04/14/2009 0000   HDL 37.10 (L) 02/22/2020 0736   HDL 32 (L) 10/31/2017 1215   CHOLHDL 4 02/22/2020 0736   VLDL 20.8 02/22/2020 0736   LDLCALC 73 02/22/2020 0736   LDLCALC 65 10/31/2017 1215    CBC    Component Value Date/Time   WBC 6.7 02/22/2020 0736   RBC 4.93 02/22/2020 0736   HGB 14.6 02/22/2020 0736   HCT 44.2 02/22/2020 0736   PLT 187.0 02/22/2020 0736   MCV 89.6 02/22/2020 0736   MCH 30.5 02/25/2015 1559   MCHC 33.1 02/22/2020 0736   RDW 14.1 02/22/2020 0736   LYMPHSABS 1.4 10/31/2015 1110   MONOABS 0.6 10/31/2015 1110   EOSABS 0.1 10/31/2015 1110   BASOSABS 0.0 10/31/2015 1110    Hgb A1C Lab Results  Component Value Date   HGBA1C 6.4 02/22/2020    BP Readings from Last 3 Encounters:  02/25/20 118/68  01/11/20 120/70  06/17/19 130/80     Assessment and Plan:   Medicare Annual Wellness Visit:  Diet: He endorses a healthy diet.  Physical activity: He is walking some Depression/mood screen: Negative Hearing: Intact to whispered voice Visual acuity: Grossly normal, performs annual eye exam  ADLs: Capable Fall risk: None Home safety: Good Cognitive evaluation: Intact to orientation, naming, recall and repetition EOL planning: Adv directives, Full code.   Preventative Medicine: Prenvar due, provided today. Other immunizations UTD. Colonoscopy due, referral placed to GI. PSA UTD. Encouraged regular exercise, healthy diet. All recommendations provided at end of visit. Exam today unremarkable. Labs reviewed.   Next appointment: 6 months for diabetes check.

## 2020-02-25 NOTE — Assessment & Plan Note (Signed)
Asymptomatic. Following with cardiology. Continue statin, clopidogrel, beta blocker, BP control, diabetes control.

## 2020-02-25 NOTE — Assessment & Plan Note (Signed)
Well controlled in the office today, continue losartan, carvedilol. CMP reviewed.

## 2020-02-25 NOTE — Assessment & Plan Note (Signed)
LDL controlled with current statin therapy. Continue atorvastatin 40 mg.

## 2020-02-25 NOTE — Assessment & Plan Note (Signed)
Due for repeat colonoscopy now, patient hasn't heard from GI. Referral placed.

## 2020-02-25 NOTE — Assessment & Plan Note (Signed)
Prenvar due, provided today. Other immunizations UTD. Colonoscopy due, referral placed to GI. PSA UTD. Encouraged regular exercise, healthy diet. All recommendations provided at end of visit. Exam today unremarkable. Labs reviewed.   I have personally reviewed and have noted: 1. The patient's medical and social history 2. Their use of alcohol, tobacco or illicit drugs 3. Their current medications and supplements 4. The patient's functional ability including ADL's, fall risks, home  safety risks and hearing or visual impairment. 5. Diet and physical activities 6. Evidence for depression or mood disorder

## 2020-02-25 NOTE — Assessment & Plan Note (Signed)
Doing well on pantoprazole daily, continue same.

## 2020-02-25 NOTE — Patient Instructions (Addendum)
Start exercising. You should be getting 150 minutes of moderate intensity exercise weekly.  Continue to work on a healthy diet. Ensure you are consuming 64 ounces of water daily.  We changed your metformin to XR version, take 1 tablet once daily with breakfast or with dinner.  You will be contacted regarding your referral to GI for the colonoscopy.  Please let us know if you have not been contacted within two weeks.   It was a pleasure to see you today!   Pneumococcal Conjugate Vaccine (PCV13): What You Need to Know 1. Why get vaccinated? Pneumococcal conjugate vaccine (PCV13) can prevent pneumococcal disease. Pneumococcal disease refers to any illness caused by pneumococcal bacteria. These bacteria can cause many types of illnesses, including pneumonia, which is an infection of the lungs. Pneumococcal bacteria are one of the most common causes of pneumonia. Besides pneumonia, pneumococcal bacteria can also cause:  Ear infections  Sinus infections  Meningitis (infection of the tissue covering the brain and spinal cord)  Bacteremia (bloodstream infection) Anyone can get pneumococcal disease, but children under 105 years of age, people with certain medical conditions, adults 30 years or older, and cigarette smokers are at the highest risk. Most pneumococcal infections are mild. However, some can result in long-term problems, such as brain damage or hearing loss. Meningitis, bacteremia, and pneumonia caused by pneumococcal disease can be fatal. 2. PCV13 PCV13 protects against 13 types of bacteria that cause pneumococcal disease. Infants and young children usually need 4 doses of pneumococcal conjugate vaccine, at 2, 4, 6, and 5-52 months of age. In some cases, a child might need fewer than 4 doses to complete PCV13 vaccination. A dose of PCV23 vaccine is also recommended for anyone 2 years or older with certain medical conditions if they did not already receive PCV13. This vaccine may be  given to adults 40 years or older based on discussions between the patient and health care provider. 3. Talk with your health care provider Tell your vaccine provider if the person getting the vaccine:  Has had an allergic reaction after a previous dose of PCV13, to an earlier pneumococcal conjugate vaccine known as PCV7, or to any vaccine containing diphtheria toxoid (for example, DTaP), or has any severe, life-threatening allergies.  In some cases, your health care provider may decide to postpone PCV13 vaccination to a future visit. People with minor illnesses, such as a cold, may be vaccinated. People who are moderately or severely ill should usually wait until they recover before getting PCV13. Your health care provider can give you more information. 4. Risks of a vaccine reaction  Redness, swelling, pain, or tenderness where the shot is given, and fever, loss of appetite, fussiness (irritability), feeling tired, headache, and chills can happen after PCV13. Young children may be at increased risk for seizures caused by fever after PCV13 if it is administered at the same time as inactivated influenza vaccine. Ask your health care provider for more information. People sometimes faint after medical procedures, including vaccination. Tell your provider if you feel dizzy or have vision changes or ringing in the ears. As with any medicine, there is a very remote chance of a vaccine causing a severe allergic reaction, other serious injury, or death. 5. What if there is a serious problem? An allergic reaction could occur after the vaccinated person leaves the clinic. If you see signs of a severe allergic reaction (hives, swelling of the face and throat, difficulty breathing, a fast heartbeat, dizziness, or weakness), call 9-1-1 and  get the person to the nearest hospital. For other signs that concern you, call your health care provider. Adverse reactions should be reported to the Vaccine Adverse Event  Reporting System (VAERS). Your health care provider will usually file this report, or you can do it yourself. Visit the VAERS website at www.vaers.SamedayNews.es or call 872-532-7144. VAERS is only for reporting reactions, and VAERS staff do not give medical advice. 6. The National Vaccine Injury Compensation Program The [employer] Vaccine Injury Compensation Program (VICP) is a federal program that was created to compensate people who may have been injured by certain vaccines. Visit the VICP website at GoldCloset.com.ee or call 562 261 5904 to learn about the program and about filing a claim. There is a time limit to file a claim for compensation. 7. How can I learn more?  Ask your health care provider.  Call your local or state health department.  Contact the Centers for Disease Control and Prevention (CDC): ? Call 904-625-8317 (1-800-CDC-INFO) or ? Visit CDC's website at http://hunter.com/ Vaccine Information Statement PCV13 Vaccine (03/12/2018) This information is not intended to replace advice given to you by your health care provider. Make sure you discuss any questions you have with your health care provider. Document Revised: 08/19/2018 Document Reviewed: 12/10/2017 Elsevier Patient Education  [place].

## 2020-02-25 NOTE — Assessment & Plan Note (Signed)
Doing well on Xyzal, continue same.

## 2020-02-25 NOTE — Assessment & Plan Note (Signed)
Well controlled with A1C of 6.4 on recent labs. Regular metformin is causing GI upset, so we will change to Metformin XR.  Eye exam is scheduled for next month. Managed on statin and ARB. Pneumonia vaccine due, provided today. Foot exam UTD.  Follow up in 6 months.

## 2020-03-08 ENCOUNTER — Other Ambulatory Visit: Payer: Self-pay | Admitting: Cardiovascular Disease

## 2020-03-08 ENCOUNTER — Other Ambulatory Visit: Payer: Self-pay | Admitting: Primary Care

## 2020-03-08 DIAGNOSIS — M542 Cervicalgia: Secondary | ICD-10-CM

## 2020-03-08 DIAGNOSIS — J302 Other seasonal allergic rhinitis: Secondary | ICD-10-CM

## 2020-03-13 ENCOUNTER — Other Ambulatory Visit: Payer: Self-pay | Admitting: Primary Care

## 2020-03-13 DIAGNOSIS — K219 Gastro-esophageal reflux disease without esophagitis: Secondary | ICD-10-CM

## 2020-04-11 ENCOUNTER — Other Ambulatory Visit: Payer: Self-pay | Admitting: Primary Care

## 2020-04-11 DIAGNOSIS — M542 Cervicalgia: Secondary | ICD-10-CM

## 2020-04-12 LAB — HM DIABETES EYE EXAM

## 2020-04-12 NOTE — Telephone Encounter (Signed)
Pharmacy requests refill on: Naproxen 500 mg   LAST REFILL: 03/10/2020 (Q-30, R-0) LAST OV: 02/25/2020 NEXT OV: Not Scheduled  PHARMACY: [place], [place]

## 2020-04-13 ENCOUNTER — Encounter: Payer: Self-pay | Admitting: Primary Care

## 2020-05-16 ENCOUNTER — Other Ambulatory Visit: Payer: Self-pay | Admitting: Primary Care

## 2020-05-16 ENCOUNTER — Other Ambulatory Visit: Payer: Self-pay | Admitting: Cardiovascular Disease

## 2020-05-16 DIAGNOSIS — M542 Cervicalgia: Secondary | ICD-10-CM

## 2020-05-17 NOTE — Telephone Encounter (Signed)
Refills sent to pharmacy. 

## 2020-05-17 NOTE — Telephone Encounter (Signed)
Ok to refill did not get often until last 2 months

## 2020-05-23 ENCOUNTER — Ambulatory Visit: Payer: Medicare Other | Admitting: Internal Medicine

## 2020-06-04 ENCOUNTER — Other Ambulatory Visit: Payer: Self-pay | Admitting: Primary Care

## 2020-06-04 DIAGNOSIS — M542 Cervicalgia: Secondary | ICD-10-CM

## 2020-06-06 NOTE — Telephone Encounter (Signed)
Does he actually need a refill of the naproxen or is this just an autofill? I do not recommend long term NSAID use given his history of heart disease. Tylenol would be a better option.

## 2020-06-08 NOTE — Telephone Encounter (Signed)
Called and spoke with patient regarding this matter. Patient stated that he did need the refill for pain. Instructed per Allie Bossier, NP that long term NSAID use is not ideal D/T his history of heart disease and that Tylenol would be a safer option. Patient verbalized understanding and stated that he would try Tylenol instead.

## 2020-06-09 NOTE — Telephone Encounter (Signed)
Noted, will discontinue naproxen.

## 2020-06-14 ENCOUNTER — Encounter: Payer: Self-pay | Admitting: Internal Medicine

## 2020-07-05 ENCOUNTER — Ambulatory Visit: Payer: Medicare Other | Admitting: Internal Medicine

## 2020-08-10 ENCOUNTER — Other Ambulatory Visit: Payer: Self-pay | Admitting: Internal Medicine

## 2020-08-10 DIAGNOSIS — I2510 Atherosclerotic heart disease of native coronary artery without angina pectoris: Secondary | ICD-10-CM

## 2020-08-10 DIAGNOSIS — E782 Mixed hyperlipidemia: Secondary | ICD-10-CM

## 2020-08-10 DIAGNOSIS — Z9861 Coronary angioplasty status: Secondary | ICD-10-CM

## 2020-08-10 DIAGNOSIS — E119 Type 2 diabetes mellitus without complications: Secondary | ICD-10-CM

## 2020-08-18 ENCOUNTER — Other Ambulatory Visit: Payer: Self-pay | Admitting: Primary Care

## 2020-08-18 DIAGNOSIS — J302 Other seasonal allergic rhinitis: Secondary | ICD-10-CM

## 2020-08-25 ENCOUNTER — Other Ambulatory Visit: Payer: Medicare Other

## 2020-09-07 NOTE — Telephone Encounter (Signed)
Called patient states he has spoke to [place] already

## 2020-09-30 ENCOUNTER — Telehealth: Payer: Self-pay

## 2020-09-30 NOTE — Telephone Encounter (Signed)
Noted, will evaluate. 

## 2020-09-30 NOTE — Telephone Encounter (Signed)
Pt's wife, Rise Paganini, called back to speak to a Freight forwarder. She complained that she was treated rudely by the person that answered the phone and the nurse she spoke to. She said they would not schedule her husband an apt and that they would not listen to her and she was on hold for 20 minutes and she thought this was extremely rude. Warner Mccreedy that this nurse was listening to the staff member that took the call and the triage nurse and they did not speak to her rudely at anytime. She then said "Well, they wouldn't schedule him an apt." Advised we were trying to talk to the provider because there were no opening on the PCPs schedule today and that is the only provider the pt would see. She said she just wanted to schedule an apt for the next available and it did not have to be today. Advised that we were concerned with the pt's current symptoms whether he should be sen sooner. She reported that the pt was not having any current dizziness or HA and this was a while ago. She just wanted the apt so he could be checked jsut to make sure everything is ok. She said this happened years ago and he was just dehydrated. Advised if he is not having any current symptoms we could schedule the next available apt. Scheduled pt for 5/26. Rise Paganini agreed to date and time. Advised if pt's symptoms returned, had gotten worse that they were before, or he had any other symptoms to contact the office. Advised of ER precautions. Advised of after hours nurse line if needed. Beverly verbalized understanding .

## 2020-09-30 NOTE — Telephone Encounter (Signed)
Alec Smith - Client TELEPHONE ADVICE RECORD AccessNurse Patient Name: Home Garden Gender: Male DOB: 09-07-1955 Age: 65 Y 24 M 11 D Return Phone Number: [phone] (Primary), [phone] (Secondary) Address[place] [address] Client Alec Smith - Client Client Site [place] [facility] [place] Physician Alma Friendly - NP Contact Type Call Who Is Calling Patient / Member / Family / Caregiver Call Type Triage / Clinical Caller Name Alec Smith Relationship To Patient Spouse Return Phone Number [phone] (Primary) Chief Complaint Headache Reason for Call Symptomatic / Request for [place], [place] office, Pt has been dizzy and has a bad headache. Caller states there are no openings. Wife states he slept all [place] yesterday. Translation No Nurse Assessment Nurse: Tooten, RN, Eustace Pen Date/Time (Eastern Time): 09/30/2020 1:27:56 PM Confirm and document reason for call. If symptomatic, describe symptoms. ---Caller states that he has a bad headache and dizziness. Yesterday he slept all [place]. Dizziness has been happening for a week now, with yesterday being the worst . Feels some better today. Does the patient have any new or worsening symptoms? ---Yes Will a triage be completed? ---Yes Related visit to physician within the last 2 weeks? ---No Does the PT have any chronic conditions? (i.e. diabetes, asthma, this includes High risk factors for pregnancy, etc.) ---Yes List chronic conditions. ---2010 cardiac stents Is this a behavioral health or substance abuse call? ---No Guidelines Guideline Title Affirmed Question Affirmed Notes Nurse Date/Time (Eastern Time) Dizziness - Lightheadedness [1] MODERATE dizziness (e.g., interferes with normal activities) AND [2] has NOT been evaluated by physician for this (Exception: dizziness Tooten,  RN, Eustace Pen 09/30/2020 1:30:19 PM PLEASE NOTE: All timestamps contained within this report are represented as [place] Standard Time. CONFIDENTIALTY NOTICE: This fax transmission is intended only for the addressee. It contains information that is legally privileged, confidential or otherwise protected from use or disclosure. If you are not the intended recipient, you are strictly prohibited from reviewing, disclosing, copying using or disseminating any of this information or taking any action in reliance on or regarding this information. If you have received this fax in error, please notify us immediately by telephone so that we can arrange for its return to [place]. Phone: [phone], Toll-Free: [phone], Fax: [phone] Page: 2 of 2 Call Id: [id number] Guidelines Guideline Title Affirmed Question Affirmed Notes Nurse Date/Time Eilene Ghazi Time) caused by heat exposure, sudden standing, or poor fluid intake) Disp. Time Eilene Ghazi Time) Disposition Final User 09/30/2020 1:15:46 PM Attempt made - message left Tooten, RN, Eustace Pen 09/30/2020 1:34:29 PM See PCP within 24 Hours Yes Tooten, RN, Eustace Pen Caller Disagree/Comply Comply Caller Understands Yes PreDisposition Did not know what to do Care Advice Given Per Guideline SEE PCP WITHIN 24 HOURS: * IF OFFICE WILL BE OPEN: You need to be examined within the next 24 hours. Call your doctor (or NP/PA) when the office opens and make an appointment. * IF OFFICE WILL BE CLOSED: You need to be seen within the next 24 hours. A clinic or an urgent care center is often a good source of care if your doctor's office is closed or you can't get an appointment. DRINK FLUIDS: * Drink several glasses of fruit juice, other clear fluids or water. * This will improve hydration and blood glucose. * If the weather is hot or you have a fever, make sure the fluids are cold. LIE DOWN AND REST: * Lie down with feet elevated  for 1 hour. * This will improve circulation and  increase blood flow to the brain. CALL BACK IF: * You become worse CARE ADVICE given per Dizziness (Adult) guideline. Referrals GO TO FACILITY UNDECIDED REFERRED TO PCP OFFICE

## 2020-09-30 NOTE — Telephone Encounter (Signed)
Please see note below access nurse note. 

## 2020-09-30 NOTE — Telephone Encounter (Signed)
pts wife calling for appt for pt; Melanie on phone said she could not get thru to access nurse and had been on hold 4 mins and could I speak with pt; pt has h/a and dizziness; I advised when I was finished with the pt I was assisting now I would speak with the pts wife. When I finished with the current pt I tried to pick up ext 4200 and no one was there; I tried 4200 again and no one was there; I tried 4201 x 2 no one was there. I called contact # on pts chart [phone].The pt answered the phone and I identified with pts name and DOB I was talking with the correct pt. Pt said for 1 wk he had been dizzy with H/A on and off but dizziness worsened on 09/29/20. Now pts H/A is pain level 3 and pt is taking naproxen NA for H/A. Pt said he feels some better today; pt said 4 - 5 yrs ago pt had similar symptoms and was dehydrated. Pt has been drinking more fluids, resting and staying out of the heat today.pt does not have any other covid symptoms. Pt does not have CP or SOB. Pt only wants to see Gentry Fitz NP and would like to be seen ASAP; I advised I would need to speak with Gentry Fitz NP to see if appropriate and safe for pt to wait until next wk to be seen.  Pt is presently about 1 hr out from returning from [place] [place] [employer] to his home; pt is passenger in the car; pts wife is driving. Pt said that his wife is on the phone waiting for an appt. With Gentry Fitz NP. Pt said while in [facility] he did not wear a mask but he did social distance. Pt has not had recent covid test and does not have covid test at home. Pt has not cked his BP but he does have a BP cuff at his home that he can ck his BP later today. I asked pt if I could put him on hold to speak with Gentry Fitz NP. Pt said yes and I put pt on hold; Melanie at phone said the pts wife is on the phone upset because she was on hold for 20 mins. I spoke with pt's wife to apologize (this is the first time I have spoken with pts wife) and to let her know that I had her husband on  hold to go and speak with Gentry Fitz NP to see if OK to wait until next wk to be seen because pt only wants to see Gentry Fitz NP because she knows the pt very well. Mrs Knittle said she had been coming to [facility] for a long time and had never been refused to make an appt. I again apologized because it was not my intent to upset her or the pt and I was just wanting to speak with the provider to make sure it was appropriate and safe for pt to wait until next wk to be seen or to see if pt could be worked in today or should pt go to ED since he thinks he is dehydrated. Pts wife said they were not going to ED today and that she had never been spoken to so rudely and she would be calling back to speak with a Freight forwarder. I advised pt's wife I was not trying to be rude I was trying to give the pt the best pt care I could and  if she wanted I would get a Midwife on the phone now for her. She said no she would cb when she got home and she was going to hang up now and I could speak with the pt.  I went to wait to speak with Gentry Fitz NP again and then [facility] came and said I did not need to speak with Anda Kraft because the pts wife had called back and was speaking with Multimedia programmer. Sending ntoe to [employer].

## 2020-10-06 ENCOUNTER — Other Ambulatory Visit: Payer: Self-pay

## 2020-10-06 ENCOUNTER — Encounter: Payer: Self-pay | Admitting: Primary Care

## 2020-10-06 ENCOUNTER — Ambulatory Visit (INDEPENDENT_AMBULATORY_CARE_PROVIDER_SITE_OTHER): Payer: Medicare Other | Admitting: Primary Care

## 2020-10-06 VITALS — BP 130/80 | HR 65 | Temp 97.6°F | Ht 68.0 in | Wt 191.8 lb

## 2020-10-06 DIAGNOSIS — Z638 Other specified problems related to primary support group: Secondary | ICD-10-CM | POA: Diagnosis not present

## 2020-10-06 DIAGNOSIS — E119 Type 2 diabetes mellitus without complications: Secondary | ICD-10-CM | POA: Diagnosis not present

## 2020-10-06 DIAGNOSIS — I2510 Atherosclerotic heart disease of native coronary artery without angina pectoris: Secondary | ICD-10-CM | POA: Diagnosis not present

## 2020-10-06 DIAGNOSIS — Z9861 Coronary angioplasty status: Secondary | ICD-10-CM | POA: Diagnosis not present

## 2020-10-06 LAB — POCT GLYCOSYLATED HEMOGLOBIN (HGB A1C): Hemoglobin A1C: 5.9 % — AB (ref 4.0–5.6)

## 2020-10-06 NOTE — Patient Instructions (Signed)
You will be contacted regarding your referral to the social worker.  Please let us know if you have not been contacted within two weeks.   Stop by the lab prior to leaving today. I will notify you of your results once received.   Please schedule a follow up appointment in 6 months for your annual check up.  It was a pleasure to see you today!

## 2020-10-06 NOTE — Assessment & Plan Note (Signed)
Compliant to Metformin XR 500 mg, A1C pending. Continue same for now.  Managed on statin and ARB.  Pneumonia vaccine UTD. Eye exam UTD.  Follow up in 6 months.
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
## 2020-10-06 NOTE — Assessment & Plan Note (Addendum)
Chronic for the last year, wife is struggling with Lewy Body Dementia.  Symptoms of headaches occur during stressful events. He's had no dizziness since the initial event. Agree that his symptoms could have been a combination of stress and dehydration. He appears well today.  Referral placed to social work for resources. He will update if anything changes.

## 2021-01-10 ENCOUNTER — Ambulatory Visit: Payer: Medicare Other | Admitting: Cardiovascular Disease

## 2021-01-12 ENCOUNTER — Telehealth: Payer: Self-pay | Admitting: Cardiovascular Disease

## 2021-01-12 NOTE — Telephone Encounter (Signed)
Patient's wife is concerned because patient is extremely fatigued. States this has been onset since the last couple of days. She thinks it may be related to his heart. States he is also having really bad headaches. She states they have been out of town and has not been able to check his blood pressure. She states they are on their way home and will be able to check his BP at that time. Please call to discuss.

## 2021-01-13 NOTE — Telephone Encounter (Signed)
Attempted to call the patient. No answer- I left a message to please call back.  

## 2021-01-17 ENCOUNTER — Other Ambulatory Visit: Payer: Self-pay | Admitting: Urology

## 2021-01-17 NOTE — Telephone Encounter (Signed)
Attempted to reach out to pt, no answer LMTCB Yearly f/u appt 9/19 with Dr. Rockey Situ

## 2021-01-19 ENCOUNTER — Other Ambulatory Visit: Payer: Self-pay | Admitting: Cardiovascular Disease

## 2021-01-19 ENCOUNTER — Other Ambulatory Visit: Payer: Self-pay | Admitting: Primary Care

## 2021-01-19 DIAGNOSIS — E119 Type 2 diabetes mellitus without complications: Secondary | ICD-10-CM

## 2021-01-19 DIAGNOSIS — J302 Other seasonal allergic rhinitis: Secondary | ICD-10-CM

## 2021-01-29 NOTE — Progress Notes (Signed)
Patient ID: Alec Smith, male   DOB: 02-Jun-1955, 65 y.o.   MRN: [id number] Cardiology Office Note  Date:  01/30/2021   ID:  Alec Smith, DOB April 21, 1956, MRN [id number]  PCP:  [name], NP   Chief Complaint  Patient presents with   12 month follow up     "Doing well." Medications reviewed by the patient verbally.     HPI:  [name] is a pleasant 65 year old gentleman with history of  stress test October 21, 2008 showed anterior ischemia  Cath showing coronary artery disease,  stent placed to the proximal to mid LAD for occluded vessel,  residual RCA disease estimated at 60% with intervention performed in 2010.  He presents for follow-up of his coronary artery disease  Significant stress at home, wife with parkinsons, lewy body Delusions, paranoia Sees [facility] She is falling  He is now retired  No exercise program Does not get much time for himself given wife's medical issues as above  Denies angina, no near syncope or syncope, no tachypalpitations  Labs: Hemoglobin A1c 5.9 Total cholesterol 130 LDL 73 Creatinine 1.1, BUN 27  EKG personally reviewed by myself on todays visit shows normal sinus rhythm with rate 63 bpm, no significant ST or T-wave changes  Cath report:  2010  a 1.5 x 10 apex crosswire  was used to ultimately gain access into the distal vessel.  This was  used to then dilate the chronically occluded segment.  A 2.0 x 30 apex  was then used to further dilate the segment from the point of distal  occlusion up to the point of proximal occlusion.  Overlapping 2.25 x 32  Taxus Adam drug-eluting stents were then deployed beginning distally and  then working proximally.  This was followed by 2.25 x 16 Taxus Adam  deployed at 16 and 18 atmospheres.  A 3.0 and 12 Taxus was then deployed  in the proximal LAD  that had 90% stenosis and the overlapping segment  was stented with a 2.75 x 8 Taxus.  The entire 2.25 segment was then  postdilated  with a 2.5 x 25 [place] Voyager up to 16 and 18 atmospheres, the  more proximal segment was postdilated with a 2.75 x a 15 [place] Voyager and  the most proximal segment where the 3.0 stent was deployed was  postdilated with a 3.25 x 12 [place] Voyager.  PMH:   has a past medical history of CAD (coronary artery disease), GERD (gastroesophageal reflux disease), adenomatous polyp of colon (02/09/2015), Hyperlipidemia, and Hypertension.  PSH:    Past Surgical History:  Procedure Laterality Date   APPENDECTOMY  1966   CORONARY ANGIOPLASTY WITH STENT PLACEMENT  2010   LUMBAR [place]   Lower back   PROSTATE BIOPSY     TONSILLECTOMY     age 78    Current Outpatient Medications  Medication Sig Dispense Refill   atorvastatin (LIPITOR) 40 MG tablet TAKE 1 TABLET BY MOUTH EVERY DAY 30 tablet 0   betamethasone dipropionate 0.05 % cream Apply topically 2 (two) times daily as needed.     carvedilol (COREG) 3.125 MG tablet TAKE 1 TABLET (3.125 MG TOTAL) BY MOUTH 2 (TWO) TIMES DAILY WITH A MEAL. 60 tablet 0   clopidogrel (PLAVIX) 75 MG tablet TAKE 1 TABLET BY MOUTH EVERY DAY 30 tablet 0   fluticasone (FLONASE) 50 MCG/ACT nasal spray PLACE 1 SPRAY INTO BOTH NOSTRILS 2 (TWO) TIMES DAILY 48 mL 1  KRILL OIL PO Take 300 mg by mouth daily.     levocetirizine (XYZAL) 5 MG tablet TAKE 1 TABLET BY MOUTH EVERY DAY IN THE EVENING FOR ALLERGIES 90 tablet 1   losartan (COZAAR) 50 MG tablet TAKE 1 TABLET BY MOUTH EVERY DAY 90 tablet 3   metFORMIN (GLUCOPHAGE-XR) 500 MG 24 hr tablet TAKE 1 TABLET BY MOUTH DAILY WITH BREAKFAST FOR DIABETES. 90 tablet 1   Multiple Vitamin (MULTIVITAMIN WITH MINERALS) TABS tablet Take 1 tablet by mouth daily.     pantoprazole (PROTONIX) 40 MG tablet TAKE 1 TABLET BY MOUTH EVERY DAY 90 tablet 3   No current facility-administered medications for this visit.    Allergies:   Patient has no known allergies.   Social History:  The patient  reports that he has never smoked. He has never  used smokeless tobacco. He reports current alcohol use. He reports that he does not use drugs.   Family History:   family history includes Aneurysm (age of onset: 35) in his mother; Heart failure in his father; Prostate cancer in his paternal grandfather; Vaginal cancer in his paternal grandmother.    Review of Systems: Review of Systems  Constitutional: Negative.   Respiratory: Negative.    Cardiovascular: Negative.   Gastrointestinal: Negative.   Musculoskeletal: Negative.   Neurological: Negative.   Psychiatric/Behavioral: Negative.    All other systems reviewed and are negative.  PHYSICAL EXAM: VS:  BP 126/80 (BP Location: Left Arm, Patient Position: Sitting, Cuff Size: Normal)   Pulse 63   Ht '5\' 8"'$  (1.727 m)   Wt 193 lb 2 oz (87.6 kg)   SpO2 99%   BMI 29.36 kg/m  , BMI Body mass index is 29.36 kg/m. Constitutional:  oriented to person, place, and time. No distress.  HENT:  Head: Grossly normal Eyes:  no discharge. No scleral icterus.  Neck: No JVD, no carotid bruits  Cardiovascular: Regular rate and rhythm, no murmurs appreciated Pulmonary/Chest: Clear to auscultation bilaterally, no wheezes or rails Abdominal: Soft.  no distension.  no tenderness.  Musculoskeletal: Normal range of motion Neurological:  normal muscle tone. Coordination normal. No atrophy Skin: Skin warm and dry Psychiatric: normal affect, pleasant   Recent Labs: 02/22/2020: ALT 27; BUN 25; Creatinine, Ser 1.09; Hemoglobin 14.6; Platelets 187.0; Potassium 4.4; Sodium 138    Lipid Panel Lab Results  Component Value Date   CHOL 131 02/22/2020   HDL 37.10 (L) 02/22/2020   LDLCALC 73 02/22/2020   TRIG 104.0 02/22/2020      Wt Readings from Last 3 Encounters:  01/30/21 193 lb 2 oz (87.6 kg)  10/06/20 191 lb 12 oz (87 kg)  02/25/20 188 lb (85.3 kg)      ASSESSMENT AND PLAN:  Atherosclerosis of native coronary artery with angina pectoris, unspecified whether native or transplanted heart  (HCC)  Currently with no symptoms of angina. No further workup at this time. Continue current medication regimen. stable  Benign essential HTN -  Blood pressure is well controlled on today's visit. No changes made to the medications.  Hyperlipidemia Cholesterol is at goal on the current lipid regimen. No changes to the medications were made.  CAD S/P percutaneous coronary angioplasty/stents Currently with no symptoms of angina. No further workup at this time. Continue current medication regimen.  Adjustment disorder/stress Wife with diagnosis of Lewy body He is now retired  Borderline diabetes Recommended dietary changes, exercise program    Total encounter time more than 25 minutes  Greater than 50% was  spent in counseling and coordination of care with the patient     Orders Placed This Encounter  Procedures   EKG 12-Lead     Signed, Esmond Plants, M.D., Ph.D. 01/30/2021  [place], [place]

## 2021-01-30 ENCOUNTER — Ambulatory Visit (INDEPENDENT_AMBULATORY_CARE_PROVIDER_SITE_OTHER): Payer: Medicare Other | Admitting: Cardiovascular Disease

## 2021-01-30 ENCOUNTER — Other Ambulatory Visit: Payer: Self-pay

## 2021-01-30 ENCOUNTER — Encounter: Payer: Self-pay | Admitting: Cardiovascular Disease

## 2021-01-30 VITALS — BP 126/80 | HR 63 | Ht 68.0 in | Wt 193.1 lb

## 2021-01-30 DIAGNOSIS — I2510 Atherosclerotic heart disease of native coronary artery without angina pectoris: Secondary | ICD-10-CM

## 2021-01-30 DIAGNOSIS — Z9861 Coronary angioplasty status: Secondary | ICD-10-CM | POA: Diagnosis not present

## 2021-01-30 DIAGNOSIS — I25118 Atherosclerotic heart disease of native coronary artery with other forms of angina pectoris: Secondary | ICD-10-CM | POA: Diagnosis not present

## 2021-01-30 DIAGNOSIS — I10 Essential (primary) hypertension: Secondary | ICD-10-CM

## 2021-01-30 DIAGNOSIS — E782 Mixed hyperlipidemia: Secondary | ICD-10-CM

## 2021-01-30 DIAGNOSIS — E1159 Type 2 diabetes mellitus with other circulatory complications: Secondary | ICD-10-CM | POA: Diagnosis not present

## 2021-01-30 MED ORDER — ATORVASTATIN CALCIUM 40 MG PO TABS: 40.0000 mg | ORAL_TABLET | Freq: Every day | ORAL | 3 refills | Status: DC

## 2021-01-30 MED ORDER — CLOPIDOGREL BISULFATE 75 MG PO TABS: 75.0000 mg | ORAL_TABLET | Freq: Every day | ORAL | 3 refills | Status: DC

## 2021-01-30 MED ORDER — CARVEDILOL 3.125 MG PO TABS: 3.1250 mg | ORAL_TABLET | Freq: Two times a day (BID) | ORAL | 3 refills | Status: DC

## 2021-01-30 NOTE — Patient Instructions (Addendum)
Medication Instructions:  No changes  If you need a refill on your cardiac medications before your next appointment, please call your pharmacy.   Lab work: No new labs needed  Testing/Procedures: No new testing needed  Follow-Up: At [facility], you and your health needs are our priority.  As part of our continuing mission to provide you with exceptional heart care, we have created designated Provider Care Teams.  These Care Teams include your primary Cardiologist (physician) and Advanced Practice Providers (APPs -  Physician Assistants and Nurse Practitioners) who all work together to provide you with the care you need, when you need it.  You will need a follow up appointment in 12 months  Providers on your designated Care Team:   Christopher Berge, NP Ryan Dunn, PA-C Jacquelyn Visser, PA-C Cadence Furth, PA-C  COVID-19 Vaccine Information can be found at: [email or web address] For questions related to vaccine distribution or appointments, please email [email or web address] or call [phone].    

## 2021-02-15 ENCOUNTER — Encounter: Payer: Self-pay | Admitting: Primary Care

## 2021-02-27 ENCOUNTER — Other Ambulatory Visit: Payer: Self-pay | Admitting: Urology

## 2021-03-04 ENCOUNTER — Other Ambulatory Visit: Payer: Self-pay | Admitting: Primary Care

## 2021-03-04 DIAGNOSIS — M542 Cervicalgia: Secondary | ICD-10-CM

## 2021-03-04 DIAGNOSIS — K219 Gastro-esophageal reflux disease without esophagitis: Secondary | ICD-10-CM

## 2021-03-06 ENCOUNTER — Telehealth: Payer: Self-pay | Admitting: Cardiovascular Disease

## 2021-03-06 NOTE — Telephone Encounter (Signed)
Reach out to pt and wife Alec Smith, reports requesting script for Nitro d/t finding old bottle that is 65 years old in the car.   Pt has not had to use Nitro after STENT placement years ago and not currently having nay cardiac symptoms nor CP.  Pt just thought to get a refill just in case to have if he was to have symptoms int he future as his currently bottle is expired.  Nitro has not been listed on pt's med list in "years".  Advised will r/t Dr. Rockey Situ to see if appropriate to order so pt may have on hand. Pt and wife thankful and will await response.

## 2021-03-06 NOTE — Telephone Encounter (Signed)
*  STAT* If patient is at the pharmacy, call can be transferred to refill team.   1. Which medications need to be refilled? (please list name of each medication and dose if known)  nitrostat hasn't refilled for the last 66 years old ones are expired   2. Which pharmacy/location (including street and city if local pharmacy) is medication to be sent to? [facility] [address]   3. Do they need a 30 day or 90 day supply? [place]

## 2021-03-06 NOTE — Telephone Encounter (Signed)
OK to add Nitro back to patient's med list? Looks like it may have been removed. Patient states he hasn't had it refilled in 12 years. NKDA Hx of CAD

## 2021-03-13 MED ORDER — NITROGLYCERIN 0.4 MG SL SUBL: 0.4000 mg | SUBLINGUAL_TABLET | SUBLINGUAL | 3 refills | Status: DC | PRN

## 2021-03-13 NOTE — Telephone Encounter (Signed)
Per Dr. Rockey Situ, okay to refill Nitro 0.4 mg SL [facility]

## 2021-03-13 NOTE — Addendum Note (Signed)
Addended by: Wynema Birch on: 03/13/2021 11:11 AM   Modules accepted: Orders

## 2021-04-11 ENCOUNTER — Encounter: Payer: Medicare Other | Admitting: Primary Care

## 2021-04-18 ENCOUNTER — Other Ambulatory Visit: Payer: Self-pay

## 2021-04-18 ENCOUNTER — Ambulatory Visit (INDEPENDENT_AMBULATORY_CARE_PROVIDER_SITE_OTHER): Payer: Medicare Other | Admitting: Primary Care

## 2021-04-18 ENCOUNTER — Encounter: Payer: Self-pay | Admitting: Primary Care

## 2021-04-18 VITALS — BP 128/84 | HR 73 | Temp 97.2°F | Ht 68.0 in | Wt 190.0 lb

## 2021-04-18 DIAGNOSIS — E119 Type 2 diabetes mellitus without complications: Secondary | ICD-10-CM | POA: Diagnosis not present

## 2021-04-18 DIAGNOSIS — K219 Gastro-esophageal reflux disease without esophagitis: Secondary | ICD-10-CM

## 2021-04-18 DIAGNOSIS — I2510 Atherosclerotic heart disease of native coronary artery without angina pectoris: Secondary | ICD-10-CM | POA: Diagnosis not present

## 2021-04-18 DIAGNOSIS — Z125 Encounter for screening for malignant neoplasm of prostate: Secondary | ICD-10-CM

## 2021-04-18 DIAGNOSIS — E782 Mixed hyperlipidemia: Secondary | ICD-10-CM

## 2021-04-18 DIAGNOSIS — Z638 Other specified problems related to primary support group: Secondary | ICD-10-CM

## 2021-04-18 DIAGNOSIS — I25118 Atherosclerotic heart disease of native coronary artery with other forms of angina pectoris: Secondary | ICD-10-CM | POA: Diagnosis not present

## 2021-04-18 DIAGNOSIS — Z9861 Coronary angioplasty status: Secondary | ICD-10-CM | POA: Diagnosis not present

## 2021-04-18 DIAGNOSIS — I10 Essential (primary) hypertension: Secondary | ICD-10-CM | POA: Diagnosis not present

## 2021-04-18 DIAGNOSIS — Z86010 Personal history of colonic polyps: Secondary | ICD-10-CM

## 2021-04-18 LAB — CBC
HCT: 44.9 % (ref 39.0–52.0)
Hemoglobin: 14.8 g/dL (ref 13.0–17.0)
MCHC: 33 g/dL (ref 30.0–36.0)
MCV: 89.1 fl (ref 78.0–100.0)
Platelets: 192 10*3/uL (ref 150.0–400.0)
RBC: 5.05 Mil/uL (ref 4.22–5.81)
RDW: 14.5 % (ref 11.5–15.5)
WBC: 6.1 10*3/uL (ref 4.0–10.5)

## 2021-04-18 LAB — COMPREHENSIVE METABOLIC PANEL
ALT: 27 U/L (ref 0–53)
AST: 20 U/L (ref 0–37)
Albumin: 4.3 g/dL (ref 3.5–5.2)
Alkaline Phosphatase: 69 U/L (ref 39–117)
BUN: 16 mg/dL (ref 6–23)
CO2: 31 mEq/L (ref 19–32)
Calcium: 9.8 mg/dL (ref 8.4–10.5)
Chloride: 104 mEq/L (ref 96–112)
Creatinine, Ser: 1.19 mg/dL (ref 0.40–1.50)
GFR: 63.77 mL/min (ref >60.00)
Glucose, Bld: 99 mg/dL (ref 70–99)
Potassium: 4.1 mEq/L (ref 3.5–5.1)
Sodium: 140 mEq/L (ref 135–145)
Total Bilirubin: 0.5 mg/dL (ref 0.2–1.2)
Total Protein: 6.7 g/dL (ref 6.0–8.3)

## 2021-04-18 LAB — HEMOGLOBIN A1C: Hgb A1c MFr Bld: 6.3 % (ref 4.6–6.5)

## 2021-04-18 LAB — LIPID PANEL
Cholesterol: 134 mg/dL (ref 0–200)
HDL: 37.1 mg/dL — ABNORMAL LOW (ref >39.00)
LDL Cholesterol: 66 mg/dL (ref 0–99)
NonHDL: 97.27
Total CHOL/HDL Ratio: 4
Triglycerides: 157 mg/dL — ABNORMAL HIGH (ref 0.0–149.0)
VLDL: 31.4 mg/dL (ref 0.0–40.0)

## 2021-04-18 LAB — PSA, MEDICARE: PSA: 3.88 ng/ml (ref 0.10–4.00)

## 2021-04-18 MED ORDER — ESCITALOPRAM OXALATE 10 MG PO TABS: 10.0000 mg | ORAL_TABLET | Freq: Every day | ORAL | 0 refills | Status: DC

## 2021-04-18 NOTE — Assessment & Plan Note (Signed)
Compliant to atorvastatin 40 mg, continue same. Repeat lipid panel pending.

## 2021-04-18 NOTE — Assessment & Plan Note (Signed)
Compliant to Metformin ER 500 mg, continue same. Repeat A1C pending.  Foot exam today. Managed on statin and ARB. Eye exam UTD. Pneumonia vaccine UTD.

## 2021-04-18 NOTE — Assessment & Plan Note (Signed)
Asymptomatic. Following with cardiology.  Continue lipid, diabetes, and hypertension control.   Labs pending.

## 2021-04-18 NOTE — Assessment & Plan Note (Signed)
Doing well on pantoprazole 40 mg, declines a dose reduction to 20 mg.   Continue pantoprazole 40 mg.

## 2021-04-18 NOTE — Assessment & Plan Note (Signed)
Compliant to atorvastatin 40 mg. LDL goal of <70.  Repeat lipid panel pending.

## 2021-04-18 NOTE — Assessment & Plan Note (Signed)
Now due for repeat colonoscopy in September 2023. He Is aware.

## 2021-04-18 NOTE — Assessment & Plan Note (Signed)
Chronic and continued, ready for treatment.  Offered therapy but he cannot break away from his wife long enough  Rx for Lexapro 10 mg sent to pharmacy. Patient is to take 1/2 tablet daily for 8 days, then advance to 1 full tablet thereafter. We discussed possible side effects of headache, GI upset, drowsiness, and SI/HI. If thoughts of SI/HI develop, we discussed to present to the emergency immediately. Patient verbalized understanding.   He will update in about 4-6 weeks.

## 2021-04-18 NOTE — Patient Instructions (Signed)
Stop by the lab prior to leaving today. I will notify you of your results once received.   Start escitalopram (Lexapro) 10 mg for anxiety and depression. Take 1/2 tablet by mouth once daily for about one week, then increase to 1 full tablet thereafter.   Please update me in about 4-6 weeks.  It was a pleasure to see you today!

## 2021-04-18 NOTE — Progress Notes (Signed)
Subjective:    Patient ID: Alec Smith, male    DOB: 1955/11/25, 65 y.o.   MRN: [id number]  HPI  Alec Smith is a very pleasant 65 y.o. male with a history of hypertension, CAD, GERD, type 2 diabetes, hyperlipidemia, seasonal allergies who presents today for follow up of chronic conditions.  1) Essential Hypertension/CAD/Atherosclerosis: Managed on atorvastatin 40 mg, carvedilol 3.125 mg BID, clopidogrel 75 mg, losartan 50 mg. Following with cardiology, last office visit was in September 2022, changes made.   He denies chest pain, dizziness, headaches.   2) Type 2 Diabetes:  Current medications include: Metformin XR 500 mg daily  Last A1C: 5.9 in May 2022 Last Eye Exam: UTD Last Foot Exam: Due Pneumonia Vaccination: UTD Urine Microalbumin: None. Managed on losartan  Statin: atorvastatin   Dietary changes since last visit: Overall fair diet.  Exercise: No regular exercise   3) GERD: Currently managed on pantoprazole 40 mg. He has never tried a dose reduction of pantoprazole to 20 mg. Has been taking 40 mg for years.   He denies breakthrough symptoms on pantoprazole 40 mg, does not wish to reduce his dose.   4) Caregiver Strain: Chronic for the last year since wife was diagnosed with Lewy Body Dementia. Symptoms include irritability, low patience with his wife, feeling down somewhat. He's having a hard time as he's the sole caregiver of his wife. He has little time to do anything for himself. His wife has delusions at night which keeps him awake.   Immunizations: -Tetanus: 2019 -Influenza: Completed this season  -Covid-19: 3 vaccines  -Shingles: Shingrix and Zostavax -Pneumonia: Prevnar 13 in 2021, Pneumovax in 2016  Eye exam: Completes annually   Colonoscopy: Completed in 2016, due September 2023 PSA: Due  BP Readings from Last 3 Encounters:  04/18/21 128/84  01/30/21 126/80  10/06/20 130/80   Wt Readings from Last 3 Encounters:  04/18/21 190 lb (86.2 kg)   01/30/21 193 lb 2 oz (87.6 kg)  10/06/20 191 lb 12 oz (87 kg)       Review of Systems  Eyes:  Negative for visual disturbance.  Respiratory:  Negative for shortness of breath.   Cardiovascular:  Negative for chest pain.  Neurological:  Negative for dizziness and headaches.  Psychiatric/Behavioral:         Irritability, anxiety, stress over the last year secondary to his wife who has Lewy Body Dementia.         Past Medical History:  Diagnosis Date   CAD (coronary artery disease)    GERD (gastroesophageal reflux disease)    Hx of adenomatous polyp of colon 02/09/2015   Hyperlipidemia    Hypertension     Social History   Socioeconomic History   Marital status: Married    Spouse name: Not on file   Number of children: 2   Years of education: Not on file   Highest education level: Not on file  Occupational History   Occupation: school Scientist, physiological    Employer: [employer]  Tobacco Use   Smoking status: Never   Smokeless tobacco: Never  Vaping Use   Vaping Use: Never used  Substance and Sexual Activity   Alcohol use: Yes    Alcohol/week: 0.0 standard drinks    Comment: once or twice a year   Drug use: No   Sexual activity: Not on file  Other Topics Concern   Not on file  Social History Narrative   Married.   Assist principal [employer]  2 sons   2 caffeine a day   Enjoys playing golf, thrift store shopping.   01/21/2015   Social Determinants of Health   Financial Resource Strain: Not on file  Food Insecurity: Not on file  Transportation Needs: Not on file  Physical Activity: Not on file  Stress: Not on file  Social Connections: Not on file  Intimate Partner Violence: Not on file    Past Surgical History:  Procedure Laterality Date   [place]   Lower back   PROSTATE BIOPSY     TONSILLECTOMY     age 18    Family History  Problem Relation Age of Onset    Aneurysm Mother 34       AAA   Heart failure Father    Prostate cancer Paternal Grandfather    Vaginal cancer Paternal Grandmother        spread to lungs   Colon cancer Neg Hx    Esophageal cancer Neg Hx    Stomach cancer Neg Hx    Rectal cancer Neg Hx     No Known Allergies  Current Outpatient Medications on File Prior to Visit  Medication Sig Dispense Refill   atorvastatin (LIPITOR) 40 MG tablet Take 1 tablet (40 mg total) by mouth daily. 90 tablet 3   betamethasone dipropionate 0.05 % cream Apply topically 2 (two) times daily as needed.     carvedilol (COREG) 3.125 MG tablet Take 1 tablet (3.125 mg total) by mouth 2 (two) times daily with a meal. 180 tablet 3   clopidogrel (PLAVIX) 75 MG tablet Take 1 tablet (75 mg total) by mouth daily. 90 tablet 3   fluticasone (FLONASE) 50 MCG/ACT nasal spray PLACE 1 SPRAY INTO BOTH NOSTRILS 2 (TWO) TIMES DAILY 48 mL 1   KRILL OIL PO Take 300 mg by mouth daily.     levocetirizine (XYZAL) 5 MG tablet TAKE 1 TABLET BY MOUTH EVERY DAY IN THE EVENING FOR ALLERGIES 90 tablet 1   losartan (COZAAR) 50 MG tablet TAKE 1 TABLET BY MOUTH EVERY DAY 90 tablet 3   metFORMIN (GLUCOPHAGE-XR) 500 MG 24 hr tablet TAKE 1 TABLET BY MOUTH DAILY WITH BREAKFAST FOR DIABETES. 90 tablet 1   Multiple Vitamin (MULTIVITAMIN WITH MINERALS) TABS tablet Take 1 tablet by mouth daily.     nitroGLYCERIN (NITROSTAT) 0.4 MG SL tablet Place 1 tablet (0.4 mg total) under the tongue every 5 (five) minutes as needed for chest pain. 25 tablet 3   pantoprazole (PROTONIX) 40 MG tablet Take 1 tablet (40 mg total) by mouth daily. For heartburn. 90 tablet 0   No current facility-administered medications on file prior to visit.    BP 128/84   Pulse 73   Temp (!) 97.2 F (36.2 C) (Temporal)   Ht 5\' 8"  (1.727 m)   Wt 190 lb (86.2 kg)   SpO2 96%   BMI 28.89 kg/m  Objective:   Physical Exam Cardiovascular:     Rate and Rhythm: Normal rate and regular rhythm.  Pulmonary:     Effort:  Pulmonary effort is normal.     Breath sounds: Normal breath sounds. No wheezing or rales.  Musculoskeletal:     Cervical back: Neck supple.  Skin:    General: Skin is warm and dry.  Neurological:     Mental Status: He is alert and oriented to person, place, and time.  Psychiatric:  Mood and Affect: Mood normal.          Assessment & Plan:      This visit occurred during the SARS-CoV-2 public health emergency.  Safety protocols were in place, including screening questions prior to the visit, additional usage of staff PPE, and extensive cleaning of exam room while observing appropriate contact time as indicated for disinfecting solutions.

## 2021-05-11 ENCOUNTER — Encounter: Payer: Self-pay | Admitting: Primary Care

## 2021-05-11 LAB — HM DIABETES EYE EXAM

## 2021-06-11 ENCOUNTER — Other Ambulatory Visit: Payer: Self-pay | Admitting: Primary Care

## 2021-06-11 DIAGNOSIS — Z638 Other specified problems related to primary support group: Secondary | ICD-10-CM

## 2021-06-16 ENCOUNTER — Other Ambulatory Visit: Payer: Self-pay | Admitting: Cardiovascular Disease

## 2021-07-15 ENCOUNTER — Other Ambulatory Visit: Payer: Self-pay | Admitting: Primary Care

## 2021-07-15 DIAGNOSIS — J302 Other seasonal allergic rhinitis: Secondary | ICD-10-CM

## 2021-07-15 DIAGNOSIS — E119 Type 2 diabetes mellitus without complications: Secondary | ICD-10-CM

## 2021-08-06 ENCOUNTER — Other Ambulatory Visit: Payer: Self-pay | Admitting: Primary Care

## 2021-08-06 DIAGNOSIS — K219 Gastro-esophageal reflux disease without esophagitis: Secondary | ICD-10-CM

## 2021-08-11 ENCOUNTER — Other Ambulatory Visit: Payer: Self-pay | Admitting: Primary Care

## 2021-08-11 DIAGNOSIS — E119 Type 2 diabetes mellitus without complications: Secondary | ICD-10-CM

## 2021-08-29 ENCOUNTER — Telehealth: Payer: Self-pay | Admitting: Primary Care

## 2021-08-29 NOTE — Telephone Encounter (Signed)
Left message for patient to call back and schedule Medicare Annual Wellness Visit (AWV) either virtually or phone ? ? ?Last WTM 02/25/20 ?please schedule at anytime with health coach ? ?This should be a 45 minute visit.  ? ?I left my direct # [phone] ?

## 2021-09-04 ENCOUNTER — Ambulatory Visit (INDEPENDENT_AMBULATORY_CARE_PROVIDER_SITE_OTHER): Payer: Medicare Other

## 2021-09-04 ENCOUNTER — Ambulatory Visit: Payer: Medicare Other

## 2021-09-04 VITALS — Ht 68.0 in | Wt 185.0 lb

## 2021-09-04 DIAGNOSIS — Z0000 Encounter for general adult medical examination without abnormal findings: Secondary | ICD-10-CM | POA: Diagnosis not present

## 2021-09-04 NOTE — Progress Notes (Addendum)
? ?Subjective:  ? Alec Smith is a 66 y.o. male who presents for Medicare Annual/Subsequent preventive examination. ? ?Review of Systems    ?Virtual Visit via Telephone Note ? ?I connected with  Alec Smith on 09/07/21 at 12:30 PM EDT by telephone and verified that I am speaking with the correct person using two identifiers. ? ?Location: ?Patient: Home ?Provider: Office ?Persons participating in the virtual visit: patient/Nurse Health Advisor ?  ?I discussed the limitations, risks, security and privacy concerns of performing an evaluation and management service by telephone and the availability of in person appointments. The patient expressed understanding and agreed to proceed. ? ?Interactive audio and video telecommunications were attempted between this nurse and patient, however failed, due to patient having technical difficulties OR patient did not have access to video capability.  We continued and completed visit with audio only. ? ?Some vital signs may be absent or patient reported.  ? ?Criselda Peaches, LPN  ?Cardiac Risk Factors include: advanced age (>69mn, >>6women);hypertension;male gender ? ?   ?Objective:  ?  ?Today's Vitals  ? 09/04/21 1236  ?Weight: 185 lb (83.9 kg)  ?Height: '5\' 8"'$  (1.727 m)  ? ?Body mass index is 28.13 kg/m?. ? ? ?  09/04/2021  ? 12:43 PM 02/25/2015  ?  3:38 PM 02/02/2015  ? 10:01 AM  ?Advanced Directives  ?Does Patient Have a Medical Advance Directive? No No No  ?Would patient like information on creating a medical advance directive? No - Patient declined No - patient declined information   ? ? ?Current Medications (verified) ?Outpatient Encounter Medications as of 09/04/2021  ?Medication Sig  ? atorvastatin (LIPITOR) 40 MG tablet Take 1 tablet (40 mg total) by mouth daily.  ? betamethasone dipropionate 0.05 % cream Apply topically 2 (two) times daily as needed.  ? carvedilol (COREG) 3.125 MG tablet Take 1 tablet (3.125 mg total) by mouth 2 (two) times daily with a meal.  ?  clopidogrel (PLAVIX) 75 MG tablet Take 1 tablet (75 mg total) by mouth daily.  ? escitalopram (LEXAPRO) 10 MG tablet TAKE 1 TABLET (10 MG TOTAL) BY MOUTH DAILY. FOR ANXIETY.  ? fluticasone (FLONASE) 50 MCG/ACT nasal spray PLACE 1 SPRAY INTO BOTH NOSTRILS 2 (TWO) TIMES DAILY  ? KRILL OIL PO Take 300 mg by mouth daily.  ? levocetirizine (XYZAL) 5 MG tablet TAKE 1 TABLET BY MOUTH EVERY DAY IN THE EVENING FOR ALLERGIES  ? losartan (COZAAR) 50 MG tablet TAKE 1 TABLET BY MOUTH EVERY DAY  ? metFORMIN (GLUCOPHAGE-XR) 500 MG 24 hr tablet TAKE 1 TABLET BY MOUTH DAILY WITH BREAKFAST FOR DIABETES.  ? Multiple Vitamin (MULTIVITAMIN WITH MINERALS) TABS tablet Take 1 tablet by mouth daily.  ? nitroGLYCERIN (NITROSTAT) 0.4 MG SL tablet Place 1 tablet (0.4 mg total) under the tongue every 5 (five) minutes as needed for chest pain.  ? pantoprazole (PROTONIX) 40 MG tablet TAKE 1 TABLET (40 MG TOTAL) BY MOUTH DAILY. FOR HEARTBURN.  ? ?No facility-administered encounter medications on file as of 09/04/2021.  ? ? ?Allergies (verified) ?Patient has no known allergies.  ? ?History: ?Past Medical History:  ?Diagnosis Date  ? CAD (coronary artery disease)   ? GERD (gastroesophageal reflux disease)   ? Hx of adenomatous polyp of colon 02/09/2015  ? Hyperlipidemia   ? Hypertension   ? ?Past Surgical History:  ?Procedure Laterality Date  ? APPENDECTOMY  1966  ? CORONARY ANGIOPLASTY WITH STENT PLACEMENT  2010  ? [facility]  [facility]  ?  Lower back  ? PROSTATE BIOPSY    ? TONSILLECTOMY    ? age 23  ? ?Family History  ?Problem Relation Age of Onset  ? Aneurysm Mother 93  ?     AAA  ? Heart failure Father   ? Prostate cancer Paternal Grandfather   ? Vaginal cancer Paternal Grandmother   ?     spread to lungs  ? Colon cancer Neg Hx   ? Esophageal cancer Neg Hx   ? Stomach cancer Neg Hx   ? Rectal cancer Neg Hx   ? ?Social History  ? ?Socioeconomic History  ? Marital status: Married  ?  Spouse name: Not on file  ? Number of children: 2  ? Years of  education: Not on file  ? Highest education level: Not on file  ?Occupational History  ? Occupation: Biomedical scientist  ?  Employer: [employer]  ?Tobacco Use  ? Smoking status: Never  ? Smokeless tobacco: Never  ?Vaping Use  ? Vaping Use: Never used  ?Substance and Sexual Activity  ? Alcohol use: Yes  ?  Alcohol/week: 0.0 standard drinks  ?  Comment: once or twice a year  ? Drug use: No  ? Sexual activity: Not on file  ?Other Topics Concern  ? Not on file  ?Social History Narrative  ? Married.  ? Assist [employer]  ? 2 sons  ? 2 caffeine a day  ? Enjoys playing golf, thrift store shopping.  ? 01/21/2015  ? ?Social Determinants of Health  ? ?Financial Resource Strain: Low Risk   ? Difficulty of Paying Living Expenses: Not hard at all  ?Food Insecurity: No Food Insecurity  ? Worried About Charity fundraiser in the Last Year: Never true  ? Ran Out of Food in the Last Year: Never true  ?Transportation Needs: No Transportation Needs  ? Lack of Transportation (Medical): No  ? Lack of Transportation (Non-Medical): No  ?Physical Activity: Inactive  ? Days of Exercise per Week: 0 days  ? Minutes of Exercise per Session: 0 min  ?Stress: No Stress Concern Present  ? Feeling of Stress : Not at all  ?Social Connections: Moderately Isolated  ? Frequency of Communication with Friends and Family: More than three times a week  ? Frequency of Social Gatherings with Friends and Family: More than three times a week  ? Attends Religious Services: Never  ? Active Member of Clubs or Organizations: No  ? Attends Archivist Meetings: Never  ? Marital Status: Married  ? ? ?Clinical Intake: ?How often do you need to have someone help you when you read instructions, pamphlets, or other written materials from your doctor or pharmacy?: 1 - Never ? ?Diabetic?  Pre Diabetic ? ?Interpreter Needed?: No ?Activities of Daily Living ? ?  09/04/2021  ? 12:42 PM 04/18/2021  ?  9:07 AM  ?In your present state of health, do you have  any difficulty performing the following activities:  ?Hearing? 0 0  ?Vision? 0 0  ?Difficulty concentrating or making decisions? 0 0  ?Walking or climbing stairs? 0 0  ?Dressing or bathing? 0 0  ?Doing errands, shopping? 0 0  ?Preparing Food and eating ? N   ?Using the Toilet? N   ?In the past six months, have you accidently leaked urine? N   ?Do you have problems with loss of bowel control? N   ?Managing your Medications? N   ?Managing your Finances? N   ?Housekeeping or  managing your Housekeeping? N   ? ? ?Patient Care Team: ?[name], NP as PCP - General (Nurse Practitioner) ?Minna Merritts, MD as Consulting Physician (Cardiology) ?Nice, Reed Breech, OD (Optometry) ? ?Indicate any recent Medical Services you may have received from other than [facility] providers in the past year (date may be approximate). ? ?   ?Assessment:  ? This is a routine wellness examination for [facility] ? ?Hearing/Vision screen ?Hearing Screening - Comments:: No hearing difficulty ?Vision Screening - Comments:: Wears glasses. Followed by Dr Matilde Sprang ? ?Dietary issues and exercise activities discussed: ?Exercise limited by: None identified ? ? Goals Addressed   ? ?  ?  ?  ?  ?  ? This Visit's Progress  ?   Increase physical activity (pt-stated)     ? ?  ? ?Depression Screen ? ?  09/04/2021  ? 12:40 PM 04/18/2021  ?  9:06 AM 02/25/2020  ?  7:41 AM 11/29/2017  ? 12:44 PM  ?PHQ 2/9 Scores  ?PHQ - 2 Score 0 0 0 1  ?PHQ- 9 Score  0 0   ?  ?Fall Risk ? ?  09/04/2021  ? 12:42 PM 04/18/2021  ?  9:07 AM 02/25/2020  ?  7:41 AM  ?Fall Risk   ?Falls in the past year? 0 0 0  ?Number falls in past yr: 0 0 0  ?Injury with Fall? 0 0 0  ?Risk for fall due to : No Fall Risks    ? ? ?FALL RISK PREVENTION PERTAINING TO THE HOME: ? ?Any stairs in or around the home? No  ?If so, are there any without handrails? No  ?Home free of loose throw rugs in walkways, pet beds, electrical cords, etc? Yes  ?Adequate lighting in your home to reduce risk of falls? Yes  ? ?ASSISTIVE  DEVICES UTILIZED TO PREVENT FALLS: ? ?Life alert? No  ?Use of a cane, walker or w/c? No  ?Grab bars in the bathroom? No  ?Shower chair or bench in shower? No  ?Elevated toilet seat or a handicapped toi

## 2021-09-04 NOTE — Patient Instructions (Addendum)
?[name] , ?Thank you for taking time to come for your Medicare Wellness Visit. I appreciate your ongoing commitment to your health goals. Please review the following plan we discussed and let me know if I can assist you in the future.  ? ?These are the goals we discussed: ? Goals   ? ?   Increase physical activity (pt-stated)   ? ?  ?  ?This is a list of the screening recommended for you and due dates:  ?Health Maintenance  ?Topic Date Due  ? Hemoglobin A1C  10/17/2021  ? Flu Shot  12/12/2021  ? Colon Cancer Screening  02/01/2022  ? Complete foot exam   04/18/2022  ? Eye exam for diabetics  05/11/2022  ? Tetanus Vaccine  11/30/2027  ? Pneumonia Vaccine  Completed  ? COVID-19 Vaccine  Completed  ? Hepatitis C Screening: USPSTF Recommendation to screen - Ages 31-79 yo.  Completed  ? Zoster (Shingles) Vaccine  Completed  ? HPV Vaccine  Aged Out  ? ?Advanced directives: No Currently in progress ? ?Conditions/risks identified: None ? ?Next appointment: Follow up in one year for your annual wellness visit.  ? ?Preventive Care 34 Years and Older, Male ?Preventive care refers to lifestyle choices and visits with your health care provider that can promote health and wellness. ?What does preventive care include? ?A yearly physical exam. This is also called an annual well check. ?Dental exams once or twice a year. ?Routine eye exams. Ask your health care provider how often you should have your eyes checked. ?Personal lifestyle choices, including: ?Daily care of your teeth and gums. ?Regular physical activity. ?Eating a healthy diet. ?Avoiding tobacco and drug use. ?Limiting alcohol use. ?Practicing safe sex. ?Taking low doses of aspirin every day. ?Taking vitamin and mineral supplements as recommended by your health care provider. ?What happens during an annual well check? ?The services and screenings done by your health care provider during your annual well check will depend on your age, overall health, lifestyle risk  factors, and family history of disease. ?Counseling  ?Your health care provider may ask you questions about your: ?Alcohol use. ?Tobacco use. ?Drug use. ?Emotional well-being. ?Home and relationship well-being. ?Sexual activity. ?Eating habits. ?History of falls. ?Memory and ability to understand (cognition). ?Work and work Statistician. ?Screening  ?You may have the following tests or measurements: ?Height, weight, and BMI. ?Blood pressure. ?Lipid and cholesterol levels. These may be checked every 5 years, or more frequently if you are over 77 years old. ?Skin check. ?Lung cancer screening. You may have this screening every year starting at age 60 if you have a 30-pack-year history of smoking and currently smoke or have quit within the past 15 years. ?Fecal occult blood test (FOBT) of the stool. You may have this test every year starting at age 37. ?Flexible sigmoidoscopy or colonoscopy. You may have a sigmoidoscopy every 5 years or a colonoscopy every 10 years starting at age 54. ?Prostate cancer screening. Recommendations will vary depending on your family history and other risks. ?Hepatitis C blood test. ?Hepatitis B blood test. ?Sexually transmitted disease (STD) testing. ?Diabetes screening. This is done by checking your blood sugar (glucose) after you have not eaten for a while (fasting). You may have this done every 1-3 years. ?Abdominal aortic aneurysm (AAA) screening. You may need this if you are a current or former smoker. ?Osteoporosis. You may be screened starting at age 63 if you are at high risk. ?Talk with your health care provider about your  test results, treatment options, and if necessary, the need for more tests. ?Vaccines  ?Your health care provider may recommend certain vaccines, such as: ?Influenza vaccine. This is recommended every year. ?Tetanus, diphtheria, and acellular pertussis (Tdap, Td) vaccine. You may need a Td booster every 10 years. ?Zoster vaccine. You may need this after age  2. ?Pneumococcal 13-valent conjugate (PCV13) vaccine. One dose is recommended after age 70. ?Pneumococcal polysaccharide (PPSV23) vaccine. One dose is recommended after age 70. ?Talk to your health care provider about which screenings and vaccines you need and how often you need them. ?This information is not intended to replace advice given to you by your health care provider. Make sure you discuss any questions you have with your health care provider. ?Document Released: 05/27/2015 Document Revised: 01/18/2016 Document Reviewed: 03/01/2015 ?Elsevier Interactive Patient Education ? 2017 [place]. ? ?Fall Prevention in the Home ?Falls can cause injuries. They can happen to people of all ages. There are many things you can do to make your home safe and to help prevent falls. ?What can I do on the outside of my home? ?Regularly fix the edges of walkways and driveways and fix any cracks. ?Remove anything that might make you trip as you walk through a door, such as a raised step or threshold. ?Trim any bushes or trees on the path to your home. ?Use bright outdoor lighting. ?Clear any walking paths of anything that might make someone trip, such as rocks or tools. ?Regularly check to see if handrails are loose or broken. Make sure that both sides of any steps have handrails. ?Any raised decks and porches should have guardrails on the edges. ?Have any leaves, snow, or ice cleared regularly. ?Use sand or salt on walking paths during winter. ?Clean up any spills in your garage right away. This includes oil or grease spills. ?What can I do in the bathroom? ?Use night lights. ?Install grab bars by the toilet and in the tub and shower. Do not use towel bars as grab bars. ?Use non-skid mats or decals in the tub or shower. ?If you need to sit down in the shower, use a plastic, non-slip stool. ?Keep the floor dry. Clean up any water that spills on the floor as soon as it happens. ?Remove soap buildup in the tub or shower  regularly. ?Attach bath mats securely with double-sided non-slip rug tape. ?Do not have throw rugs and other things on the floor that can make you trip. ?What can I do in the bedroom? ?Use night lights. ?Make sure that you have a light by your bed that is easy to reach. ?Do not use any sheets or blankets that are too big for your bed. They should not hang down onto the floor. ?Have a firm chair that has side arms. You can use this for support while you get dressed. ?Do not have throw rugs and other things on the floor that can make you trip. ?What can I do in the kitchen? ?Clean up any spills right away. ?Avoid walking on wet floors. ?Keep items that you use a lot in easy-to-reach places. ?If you need to reach something above you, use a strong step stool that has a grab bar. ?Keep electrical cords out of the way. ?Do not use floor polish or wax that makes floors slippery. If you must use wax, use non-skid floor wax. ?Do not have throw rugs and other things on the floor that can make you trip. ?What can I do with my  stairs? ?Do not leave any items on the stairs. ?Make sure that there are handrails on both sides of the stairs and use them. Fix handrails that are broken or loose. Make sure that handrails are as long as the stairways. ?Check any carpeting to make sure that it is firmly attached to the stairs. Fix any carpet that is loose or worn. ?Avoid having throw rugs at the top or bottom of the stairs. If you do have throw rugs, attach them to the floor with carpet tape. ?Make sure that you have a light switch at the top of the stairs and the bottom of the stairs. If you do not have them, ask someone to add them for you. ?What else can I do to help prevent falls? ?Wear shoes that: ?Do not have high heels. ?Have rubber bottoms. ?Are comfortable and fit you well. ?Are closed at the toe. Do not wear sandals. ?If you use a stepladder: ?Make sure that it is fully opened. Do not climb a closed stepladder. ?Make sure that  both sides of the stepladder are locked into place. ?Ask someone to hold it for you, if possible. ?Clearly Micah and make sure that you can see: ?Any grab bars or handrails. ?First and last steps. ?Where the ed

## 2021-10-02 ENCOUNTER — Other Ambulatory Visit: Payer: Self-pay | Admitting: Primary Care

## 2021-10-02 DIAGNOSIS — J302 Other seasonal allergic rhinitis: Secondary | ICD-10-CM

## 2021-10-04 ENCOUNTER — Other Ambulatory Visit: Payer: Self-pay | Admitting: Primary Care

## 2021-10-04 DIAGNOSIS — J302 Other seasonal allergic rhinitis: Secondary | ICD-10-CM

## 2021-10-05 DIAGNOSIS — J302 Other seasonal allergic rhinitis: Secondary | ICD-10-CM

## 2021-10-05 MED ORDER — FLUTICASONE PROPIONATE 50 MCG/ACT NA SUSP: 1.0000 | Freq: Two times a day (BID) | NASAL | 0 refills | Status: DC

## 2021-11-10 ENCOUNTER — Other Ambulatory Visit: Payer: Self-pay | Admitting: Primary Care

## 2021-11-10 DIAGNOSIS — E119 Type 2 diabetes mellitus without complications: Secondary | ICD-10-CM

## 2021-11-10 NOTE — Telephone Encounter (Signed)
Left message on voicemail for patient to call the office back. When  patient calls back he needs to schedule an appointment Monday 11/13/21 See note from PCP.

## 2021-11-10 NOTE — Telephone Encounter (Signed)
Patient is due for diabetes follow up. Can we get him scheduled for Monday 11/13/21?

## 2021-11-10 NOTE — Telephone Encounter (Signed)
Left message to return call to our office.  

## 2021-11-22 NOTE — Telephone Encounter (Signed)
Spoke to patient by telephone and was advised that he has not scheduled an appointment because he has been taking his wife for appointments. Patient scheduled an appointment with Allie Bossier NP 12/05/21 at 7:40 am.

## 2021-12-05 ENCOUNTER — Ambulatory Visit (INDEPENDENT_AMBULATORY_CARE_PROVIDER_SITE_OTHER): Payer: Medicare Other | Admitting: Primary Care

## 2021-12-05 ENCOUNTER — Encounter: Payer: Self-pay | Admitting: Primary Care

## 2021-12-05 VITALS — BP 126/76 | HR 86 | Temp 98.7°F | Ht 68.0 in | Wt 198.0 lb

## 2021-12-05 DIAGNOSIS — E119 Type 2 diabetes mellitus without complications: Secondary | ICD-10-CM

## 2021-12-05 DIAGNOSIS — Z638 Other specified problems related to primary support group: Secondary | ICD-10-CM | POA: Diagnosis not present

## 2021-12-05 LAB — POCT GLYCOSYLATED HEMOGLOBIN (HGB A1C): Hemoglobin A1C: 6.1 % — AB (ref 4.0–5.6)

## 2021-12-05 MED ORDER — CITALOPRAM HYDROBROMIDE 20 MG PO TABS: 20.0000 mg | ORAL_TABLET | Freq: Every day | ORAL | 0 refills | Status: DC

## 2021-12-05 NOTE — Assessment & Plan Note (Signed)
Controlled with A1C of 6.1 today.  Continue metformin XR 500 mg daily.   Managed on statin and ARB. Pneumonia vaccine UTD. Foot and eye exam UTD.  Follow up in 6 months.

## 2021-12-05 NOTE — Patient Instructions (Addendum)
Start citalopram 20 mg daily. Take 1/2 tablet by mouth once daily for about one week, then increase to 1 full tablet thereafter.   Keep working on [employer].   Please schedule a physical to meet with me in 6 months.   It was a pleasure to see you today!

## 2021-12-05 NOTE — Assessment & Plan Note (Addendum)
Uncontrolled. Side effects of ED with Lexapro 10 mg.   Start citalopram 20 mg daily. He declines therapy.  Patient is to take 1/2 tablet daily for 8 days, then advance to 1 full tablet thereafter. We discussed possible side effects of headache, GI upset, drowsiness, and SI/HI. If thoughts of SI/HI develop, we discussed to present to the emergency immediately. Patient verbalized understanding.   He will update.

## 2021-12-05 NOTE — Progress Notes (Signed)
Subjective:    Patient ID: Alec Smith, male    DOB: January 20, 1956, 66 y.o.   MRN: [id number]  Diabetes Hypoglycemia symptoms include nervousness/anxiousness. Pertinent negatives for hypoglycemia include no dizziness or headaches. Pertinent negatives for diabetes include no chest pain.    Alec Smith is a very pleasant 66 y.o. male with a history of type 2 diabetes, hypertension, CAD, hyperlipidemia, fatigue who presents today for follow-up of diabetes and caregiver strain.  1) Type 2 Diabetes:  Current medications include: Metformin XR 500 mg daily  He is checking his blood glucose 0 times daily.  Last A1C 6.3 in December 2022, 6.1 today Last Eye Exam: UTD Last Foot Exam: UTD Pneumonia Vaccination: 2021 Urine Microalbumin: None.  Managed on ARB Statin: Atorvastatin  Dietary changes since last visit: Home cooked meals mostly.    Exercise: No regular exercise.   BP Readings from Last 3 Encounters:  12/05/21 126/76  04/18/21 128/84  01/30/21 126/80   2) Caregiver Strain: Wife with Frontotemporal Dementia vs Lewy Body Dementia which has caused tremendous stress. Patient's wife with hallucinations, recurrent falls, requiring frequent monitoring.   Previously managed on Lexapro 10 mg which was initiated during his last visit in December 2022. He took for about two weeks and found a reduction in overall stress and improved patience. He did experience side effects of erectile dysfunction.    He does not meet with therapy.     Review of Systems  Eyes:  Negative for visual disturbance.  Respiratory:  Negative for shortness of breath.   Cardiovascular:  Negative for chest pain.  Neurological:  Negative for dizziness and headaches.  Psychiatric/Behavioral:  The patient is nervous/anxious.        See HPI         Past Medical History:  Diagnosis Date   CAD (coronary artery disease)    GERD (gastroesophageal reflux disease)    Hx of adenomatous polyp of colon  02/09/2015   Hyperlipidemia    Hypertension     Social History   Socioeconomic History   Marital status: Married    Spouse name: Not on file   Number of children: 2   Years of education: Not on file   Highest education level: Not on file  Occupational History   Occupation: school Scientist, physiological    Employer: [employer]  Tobacco Use   Smoking status: Never   Smokeless tobacco: Never  Vaping Use   Vaping Use: Never used  Substance and Sexual Activity   Alcohol use: Yes    Alcohol/week: 0.0 standard drinks of alcohol    Comment: once or twice a year   Drug use: No   Sexual activity: Not on file  Other Topics Concern   Not on file  Social History Narrative   Married.   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   01/21/2015   Social Determinants of Health   Financial Resource Strain: Low Risk  (09/04/2021)   Overall Financial Resource Strain (CARDIA)    Difficulty of Paying Living Expenses: Not hard at all  Food Insecurity: No Food Insecurity (09/04/2021)   Hunger Vital Sign    Worried About Running Out of Food in the Last Year: Never true    Ran Out of Food in the Last Year: Never true  Transportation Needs: No Transportation Needs (09/04/2021)   PRAPARE - Hydrologist (Medical):  No    Lack of Transportation (Non-Medical): No  Physical Activity: Inactive (09/04/2021)   Exercise Vital Sign    Days of Exercise per Week: 0 days    Minutes of Exercise per Session: 0 min  Stress: No Stress Concern Present (09/04/2021)   [place]    Feeling of Stress : Not at all  Social Connections: Moderately Isolated (09/04/2021)   Social Connection and Isolation Panel [NHANES]    Frequency of Communication with Friends and Family: More than three times a week    Frequency of Social Gatherings with Friends and Family: More than three  times a week    Attends Religious Services: Never    Marine scientist or Organizations: No    Attends Archivist Meetings: Never    Marital Status: Married  Human resources officer Violence: Not At Risk (09/04/2021)   Humiliation, Afraid, Rape, and Kick questionnaire    Fear of Current or Ex-Partner: No    Emotionally Abused: No    Physically Abused: No    Sexually Abused: No    Past Surgical History:  Procedure Laterality Date   APPENDECTOMY  1966   CORONARY ANGIOPLASTY WITH STENT PLACEMENT  2010   LUMBAR [place]   Lower back   PROSTATE BIOPSY     TONSILLECTOMY     age 60    Family History  Problem Relation Age of Onset   Aneurysm Mother 59       AAA   Heart failure Father    Prostate cancer Paternal Grandfather    Vaginal cancer Paternal Grandmother        spread to lungs   Colon cancer Neg Hx    Esophageal cancer Neg Hx    Stomach cancer Neg Hx    Rectal cancer Neg Hx     No Known Allergies  Current Outpatient Medications on File Prior to Visit  Medication Sig Dispense Refill   atorvastatin (LIPITOR) 40 MG tablet Take 1 tablet (40 mg total) by mouth daily. 90 tablet 3   betamethasone dipropionate 0.05 % cream Apply topically 2 (two) times daily as needed.     carvedilol (COREG) 3.125 MG tablet Take 1 tablet (3.125 mg total) by mouth 2 (two) times daily with a meal. 180 tablet 3   clopidogrel (PLAVIX) 75 MG tablet Take 1 tablet (75 mg total) by mouth daily. 90 tablet 3   fluticasone (FLONASE) 50 MCG/ACT nasal spray Place 1 spray into both nostrils 2 (two) times daily. 48 mL 0   KRILL OIL PO Take 300 mg by mouth daily.     levocetirizine (XYZAL) 5 MG tablet TAKE 1 TABLET BY MOUTH EVERY DAY IN THE EVENING FOR ALLERGIES 90 tablet 2   losartan (COZAAR) 50 MG tablet TAKE 1 TABLET BY MOUTH EVERY DAY 90 tablet 2   metFORMIN (GLUCOPHAGE-XR) 500 MG 24 hr tablet TAKE 1 TABLET BY MOUTH DAILY WITH BREAKFAST FOR DIABETES. 90 tablet 0   Multiple Vitamin  (MULTIVITAMIN WITH MINERALS) TABS tablet Take 1 tablet by mouth daily.     nitroGLYCERIN (NITROSTAT) 0.4 MG SL tablet Place 1 tablet (0.4 mg total) under the tongue every 5 (five) minutes as needed for chest pain. 25 tablet 3   pantoprazole (PROTONIX) 40 MG tablet TAKE 1 TABLET (40 MG TOTAL) BY MOUTH DAILY. FOR HEARTBURN. 90 tablet 2   No current facility-administered medications on file prior to visit.    BP 126/76  Pulse 86   Temp 98.7 F (37.1 C) (Oral)   Ht '5\' 8"'$  (1.727 m)   Wt 198 lb (89.8 kg)   SpO2 98%   BMI 30.11 kg/m  Objective:   Physical Exam Cardiovascular:     Rate and Rhythm: Normal rate and regular rhythm.  Pulmonary:     Effort: Pulmonary effort is normal.     Breath sounds: Normal breath sounds. No wheezing or rales.  Musculoskeletal:     Cervical back: Neck supple.  Skin:    General: Skin is warm and dry.  Neurological:     Mental Status: He is alert and oriented to person, place, and time.  Psychiatric:        Mood and Affect: Mood normal.           Assessment & Plan:   Problem List Items Addressed This Visit       Endocrine   Type 2 diabetes mellitus with hyperglycemia ([place]) - Primary    Controlled with A1C of 6.1 today.  Continue metformin XR 500 mg daily.   Managed on statin and ARB. Pneumonia vaccine UTD. Foot and eye exam UTD.  Follow up in 6 months.        Other   Caregiver role strain    Uncontrolled. Side effects of ED with Lexapro 10 mg.   Start citalopram 20 mg daily. He declines therapy.  Patient is to take 1/2 tablet daily for 8 days, then advance to 1 full tablet thereafter. We discussed possible side effects of headache, GI upset, drowsiness, and SI/HI. If thoughts of SI/HI develop, we discussed to present to the emergency immediately. Patient verbalized understanding.   He will update.        Relevant Medications   citalopram (CELEXA) 20 MG tablet       [name], NP

## 2022-01-04 ENCOUNTER — Other Ambulatory Visit: Payer: Self-pay | Admitting: Primary Care

## 2022-01-04 DIAGNOSIS — J302 Other seasonal allergic rhinitis: Secondary | ICD-10-CM

## 2022-02-04 ENCOUNTER — Other Ambulatory Visit: Payer: Self-pay | Admitting: Cardiovascular Disease

## 2022-02-05 ENCOUNTER — Other Ambulatory Visit: Payer: Self-pay | Admitting: Primary Care

## 2022-02-05 DIAGNOSIS — E119 Type 2 diabetes mellitus without complications: Secondary | ICD-10-CM

## 2022-03-02 ENCOUNTER — Other Ambulatory Visit: Payer: Self-pay | Admitting: Primary Care

## 2022-03-02 DIAGNOSIS — Z638 Other specified problems related to primary support group: Secondary | ICD-10-CM

## 2022-03-15 ENCOUNTER — Other Ambulatory Visit: Payer: Self-pay | Admitting: Cardiovascular Disease

## 2022-03-15 NOTE — Progress Notes (Deleted)
Patient ID: Alec Smith, male   DOB: 02/23/1956, 66 y.o.   MRN: [id number] Cardiology Office Note  Date:  03/15/2022   ID:  Alec Smith, DOB July 29, 1955, MRN [id number]  PCP:  [name], NP   No chief complaint on file.   HPI:  Alec Smith is a pleasant 66 year old gentleman with history of  stress test October 21, 2008 showed anterior ischemia  Cath showing coronary artery disease,  stent placed to the proximal to mid LAD for occluded vessel,  residual RCA disease estimated at 60% with intervention performed in 2010.  He presents for follow-up of his coronary artery disease  Last seen by myself in clinic September 2022  Significant stress at home, wife with parkinsons, lewy body Delusions, paranoia Sees [facility] She is falling  He is now retired  No exercise program Does not get much time for himself given wife's medical issues as above  Denies angina, no near syncope or syncope, no tachypalpitations  Labs: Hemoglobin A1c 5.9 Total cholesterol 130 LDL 73 Creatinine 1.1, BUN 27  EKG personally reviewed by myself on todays visit shows normal sinus rhythm with rate 63 bpm, no significant ST or T-wave changes  Cath report:  2010  a 1.5 x 10 apex crosswire  was used to ultimately gain access into the distal vessel.  This was  used to then dilate the chronically occluded segment.  A 2.0 x 30 apex  was then used to further dilate the segment from the point of distal  occlusion up to the point of proximal occlusion.  Overlapping 2.25 x 32  Taxus Adam drug-eluting stents were then deployed beginning distally and  then working proximally.  This was followed by 2.25 x 16 Taxus Adam  deployed at 16 and 18 atmospheres.  A 3.0 and 12 Taxus was then deployed  in the proximal LAD  that had 90% stenosis and the overlapping segment  was stented with a 2.75 x 8 Taxus.  The entire 2.25 segment was then  postdilated with a 2.5 x 25 [place] Voyager up to 16 and 18 atmospheres,  the  more proximal segment was postdilated with a 2.75 x a 15 [place] Voyager and  the most proximal segment where the 3.0 stent was deployed was  postdilated with a 3.25 x 12  Voyager.  PMH:   has a past medical history of CAD (coronary artery disease), GERD (gastroesophageal reflux disease), adenomatous polyp of colon (02/09/2015), Hyperlipidemia, and Hypertension.  PSH:    Past Surgical History:  Procedure Laterality Date   APPENDECTOMY  1966   CORONARY ANGIOPLASTY WITH STENT PLACEMENT  2010   LUMBAR [place]   Lower back   PROSTATE BIOPSY     TONSILLECTOMY     age 58    Current Outpatient Medications  Medication Sig Dispense Refill   atorvastatin (LIPITOR) 40 MG tablet TAKE 1 TABLET BY MOUTH EVERY DAY 30 tablet 0   betamethasone dipropionate 0.05 % cream Apply topically 2 (two) times daily as needed.     carvedilol (COREG) 3.125 MG tablet TAKE 1 TABLET (3.125 MG TOTAL) BY MOUTH 2 (TWO) TIMES DAILY WITH A MEAL. 180 tablet 0   citalopram (CELEXA) 20 MG tablet TAKE 1 TABLET (20 MG TOTAL) BY MOUTH DAILY. FOR ANXIETY AND DEPRESSION. 90 tablet 0   clopidogrel (PLAVIX) 75 MG tablet TAKE 1 TABLET BY MOUTH EVERY DAY 90 tablet 0   fluticasone (FLONASE) 50 MCG/ACT nasal spray PLACE 1 SPRAY INTO  BOTH NOSTRILS 2 (TWO) TIMES DAILY 48 mL 0   KRILL OIL PO Take 300 mg by mouth daily.     levocetirizine (XYZAL) 5 MG tablet TAKE 1 TABLET BY MOUTH EVERY DAY IN THE EVENING FOR ALLERGIES 90 tablet 2   losartan (COZAAR) 50 MG tablet TAKE 1 TABLET BY MOUTH EVERY DAY 30 tablet 0   metFORMIN (GLUCOPHAGE-XR) 500 MG 24 hr tablet TAKE 1 TABLET BY MOUTH DAILY WITH BREAKFAST FOR DIABETES. 90 tablet 0   Multiple Vitamin (MULTIVITAMIN WITH MINERALS) TABS tablet Take 1 tablet by mouth daily.     nitroGLYCERIN (NITROSTAT) 0.4 MG SL tablet Place 1 tablet (0.4 mg total) under the tongue every 5 (five) minutes as needed for chest pain. 25 tablet 3   pantoprazole (PROTONIX) 40 MG tablet TAKE 1 TABLET (40 MG  TOTAL) BY MOUTH DAILY. FOR HEARTBURN. 90 tablet 2   No current facility-administered medications for this visit.    Allergies:   Patient has no known allergies.   Social History:  The patient  reports that he has never smoked. He has never used smokeless tobacco. He reports current alcohol use. He reports that he does not use drugs.   Family History:   family history includes Aneurysm (age of onset: 71) in his mother; Heart failure in his father; Prostate cancer in his paternal grandfather; Vaginal cancer in his paternal grandmother.    Review of Systems: Review of Systems  Constitutional: Negative.   Respiratory: Negative.    Cardiovascular: Negative.   Gastrointestinal: Negative.   Musculoskeletal: Negative.   Neurological: Negative.   Psychiatric/Behavioral: Negative.    All other systems reviewed and are negative.   PHYSICAL EXAM: VS:  There were no vitals taken for this visit. , BMI There is no height or weight on file to calculate BMI. Constitutional:  oriented to person, place, and time. No distress.  HENT:  Head: Grossly normal Eyes:  no discharge. No scleral icterus.  Neck: No JVD, no carotid bruits  Cardiovascular: Regular rate and rhythm, no murmurs appreciated Pulmonary/Chest: Clear to auscultation bilaterally, no wheezes or rails Abdominal: Soft.  no distension.  no tenderness.  Musculoskeletal: Normal range of motion Neurological:  normal muscle tone. Coordination normal. No atrophy Skin: Skin warm and dry Psychiatric: normal affect, pleasant   Recent Labs: 04/18/2021: ALT 27; BUN 16; Creatinine, Ser 1.19; Hemoglobin 14.8; Platelets 192.0; Potassium 4.1; Sodium 140    Lipid Panel Lab Results  Component Value Date   CHOL 134 04/18/2021   HDL 37.10 (L) 04/18/2021   LDLCALC 66 04/18/2021   TRIG 157.0 (H) 04/18/2021      Wt Readings from Last 3 Encounters:  12/05/21 198 lb (89.8 kg)  09/04/21 185 lb (83.9 kg)  04/18/21 190 lb (86.2 kg)       ASSESSMENT AND PLAN:  Atherosclerosis of native coronary artery with angina pectoris, unspecified whether native or transplanted heart [facility])  Currently with no symptoms of angina. No further workup at this time. Continue current medication regimen. stable  Benign essential HTN -  Blood pressure is well controlled on today's visit. No changes made to the medications.  Hyperlipidemia Cholesterol is at goal on the current lipid regimen. No changes to the medications were made.  CAD S/P percutaneous coronary angioplasty/stents Currently with no symptoms of angina. No further workup at this time. Continue current medication regimen.  Adjustment disorder/stress Wife with diagnosis of Lewy body He is now retired  Borderline diabetes Recommended dietary changes, exercise program  Total encounter time more than 25 minutes  Greater than 50% was spent in counseling and coordination of care with the patient     No orders of the defined types were placed in this encounter.    Signed, Esmond Plants, M.D., Ph.D. 03/15/2022  [place], [place]

## 2022-03-16 ENCOUNTER — Ambulatory Visit: Payer: Medicare Other | Attending: Cardiovascular Disease | Admitting: Cardiovascular Disease

## 2022-03-16 DIAGNOSIS — E1165 Type 2 diabetes mellitus with hyperglycemia: Secondary | ICD-10-CM

## 2022-03-16 DIAGNOSIS — I25118 Atherosclerotic heart disease of native coronary artery with other forms of angina pectoris: Secondary | ICD-10-CM

## 2022-03-16 DIAGNOSIS — E1159 Type 2 diabetes mellitus with other circulatory complications: Secondary | ICD-10-CM

## 2022-03-16 DIAGNOSIS — E782 Mixed hyperlipidemia: Secondary | ICD-10-CM

## 2022-03-16 DIAGNOSIS — I2510 Atherosclerotic heart disease of native coronary artery without angina pectoris: Secondary | ICD-10-CM

## 2022-03-16 DIAGNOSIS — I10 Essential (primary) hypertension: Secondary | ICD-10-CM

## 2022-03-20 ENCOUNTER — Encounter: Payer: Self-pay | Admitting: Cardiovascular Disease

## 2022-03-27 ENCOUNTER — Ambulatory Visit: Payer: Medicare Other | Admitting: Cardiovascular Disease

## 2022-03-31 NOTE — Progress Notes (Unsigned)
Patient ID: Alec Smith, male   DOB: 08-26-1955, 66 y.o.   MRN: [id number] Cardiology Office Note  Date:  04/02/2022   ID:  Alec Smith, Alec Smith 06-06-1955, MRN [id number]  PCP:  Alec Koch, NP   Chief Complaint  Patient presents with   12 month follow up     "Doing well." Medications reviewed by the patient verbally.     HPI:  Alec Smith is a pleasant 66 year old gentleman with history of  stress test October 21, 2008 showed anterior ischemia  Cath showing coronary artery disease,  stent placed to the proximal - mid LAD for occluded vessel,  residual RCA disease estimated at 60% with intervention performed in 2010.  He presents for follow-up of his coronary artery disease  Last seen by myself in clinic September 2022  Significant stress at home, wife with parkinsons, lewy body She is having delusions, paranoia Sees [facility], does not seem to have good medication options She is falling, has had several run-ins with the police Sleep disruption  He denies significant chest pain concerning for angina No tachypalpitations concerning for arrhythmia retired , No exercise program Does not get much time for himself as he is taking care of his wife and cannot leave her for extended periods of time  Labs: Hemoglobin A1c 5.9 Total cholesterol 130 LDL 73 Creatinine 1.1, BUN 27  EKG personally reviewed by myself on todays visit shows normal sinus rhythm with rate 70 bpm, no significant ST or T-wave changes  Cath report:  2010  a 1.5 x 10 apex crosswire  was used to ultimately gain access into the distal vessel.  This was  used to then dilate the chronically occluded segment.  A 2.0 x 30 apex  was then used to further dilate the segment from the point of distal  occlusion up to the point of proximal occlusion.  Overlapping 2.25 x 32  Taxus Adam drug-eluting stents were then deployed beginning distally and  then working proximally.  This was followed by 2.25 x 16 Taxus  Adam  deployed at 16 and 18 atmospheres.  A 3.0 and 12 Taxus was then deployed  in the proximal LAD  that had 90% stenosis and the overlapping segment  was stented with a 2.75 x 8 Taxus.  The entire 2.25 segment was then  postdilated with a 2.5 x 25 [place] Voyager up to 16 and 18 atmospheres, the  more proximal segment was postdilated with a 2.75 x a 15 [place] Voyager and  the most proximal segment where the 3.0 stent was deployed was  postdilated with a 3.25 x 12 [place] Voyager.  PMH:   has a past medical history of CAD (coronary artery disease), GERD (gastroesophageal reflux disease), adenomatous polyp of colon (02/09/2015), Hyperlipidemia, and Hypertension.  PSH:    Past Surgical History:  Procedure Laterality Date   APPENDECTOMY  1966   CORONARY ANGIOPLASTY WITH STENT PLACEMENT  2010   LUMBAR [place]   Lower back   PROSTATE BIOPSY     TONSILLECTOMY     age 66    Current Outpatient Medications  Medication Sig Dispense Refill   atorvastatin (LIPITOR) 40 MG tablet TAKE 1 TABLET BY MOUTH EVERY DAY 30 tablet 0   betamethasone dipropionate 0.05 % cream Apply topically 2 (two) times daily as needed.     carvedilol (COREG) 3.125 MG tablet TAKE 1 TABLET (3.125 MG TOTAL) BY MOUTH 2 (TWO) TIMES DAILY WITH A MEAL. 180 tablet 0  citalopram (CELEXA) 20 MG tablet TAKE 1 TABLET (20 MG TOTAL) BY MOUTH DAILY. FOR ANXIETY AND DEPRESSION. 90 tablet 0   clopidogrel (PLAVIX) 75 MG tablet TAKE 1 TABLET BY MOUTH EVERY DAY 90 tablet 0   fluticasone (FLONASE) 50 MCG/ACT nasal spray PLACE 1 SPRAY INTO BOTH NOSTRILS 2 (TWO) TIMES DAILY 48 mL 0   KRILL OIL PO Take 300 mg by mouth daily.     levocetirizine (XYZAL) 5 MG tablet TAKE 1 TABLET BY MOUTH EVERY DAY IN THE EVENING FOR ALLERGIES 90 tablet 2   losartan (COZAAR) 50 MG tablet TAKE 1 TABLET BY MOUTH EVERY DAY 30 tablet 0   metFORMIN (GLUCOPHAGE-XR) 500 MG 24 hr tablet TAKE 1 TABLET BY MOUTH DAILY WITH BREAKFAST FOR DIABETES. 90 tablet 0   Multiple  Vitamin (MULTIVITAMIN WITH MINERALS) TABS tablet Take 1 tablet by mouth daily.     nitroGLYCERIN (NITROSTAT) 0.4 MG SL tablet Place 1 tablet (0.4 mg total) under the tongue every 5 (five) minutes as needed for chest pain. 25 tablet 3   pantoprazole (PROTONIX) 40 MG tablet TAKE 1 TABLET (40 MG TOTAL) BY MOUTH DAILY. FOR HEARTBURN. 90 tablet 2   No current facility-administered medications for this visit.    Allergies:   Patient has no known allergies.   Social History:  The patient  reports that he has never smoked. He has never used smokeless tobacco. He reports current alcohol use. He reports that he does not use drugs.   Family History:   family history includes Aneurysm (age of onset: 62) in his mother; Heart failure in his father; Prostate cancer in his paternal grandfather; Vaginal cancer in his paternal grandmother.   Review of Systems: Review of Systems  Constitutional: Negative.   Respiratory: Negative.    Cardiovascular: Negative.   Gastrointestinal: Negative.   Musculoskeletal: Negative.   Neurological: Negative.   Psychiatric/Behavioral: Negative.    All other systems reviewed and are negative.  PHYSICAL EXAM: VS:  BP (!) 140/72 (BP Location: Left Arm, Patient Position: Sitting, Cuff Size: Normal)   Pulse 70   Ht '5\' 8"'$  (1.727 m)   Wt 201 lb 4 oz (91.3 kg)   SpO2 98%   BMI 30.60 kg/m  , BMI Body mass index is 30.6 kg/m. Constitutional:  oriented to person, place, and time. No distress.  HENT:  Head: Grossly normal Eyes:  no discharge. No scleral icterus.  Neck: No JVD, no carotid bruits  Cardiovascular: Regular rate and rhythm, no murmurs appreciated Pulmonary/Chest: Clear to auscultation bilaterally, no wheezes or rails Abdominal: Soft.  no distension.  no tenderness.  Musculoskeletal: Normal range of motion Neurological:  normal muscle tone. Coordination normal. No atrophy Skin: Skin warm and dry Psychiatric: normal affect, pleasant  Recent Labs: 04/18/2021:  ALT 27; BUN 16; Creatinine, Ser 1.19; Hemoglobin 14.8; Platelets 192.0; Potassium 4.1; Sodium 140    Lipid Panel Lab Results  Component Value Date   CHOL 134 04/18/2021   HDL 37.10 (L) 04/18/2021   LDLCALC 66 04/18/2021   TRIG 157.0 (H) 04/18/2021      Wt Readings from Last 3 Encounters:  04/02/22 201 lb 4 oz (91.3 kg)  12/05/21 198 lb (89.8 kg)  09/04/21 185 lb (83.9 kg)     ASSESSMENT AND PLAN:  Atherosclerosis of native coronary artery with angina pectoris, unspecified whether native or transplanted heart [facility])  Currently with no symptoms of angina. No further workup at this time. Continue current medication regimen. Recommended regular walking program  Benign  essential HTN -  Blood pressure is well controlled on today's visit. No changes made to the medications.  Hyperlipidemia Cholesterol is at goal on the current lipid regimen. No changes to the medications were made.  Adjustment disorder/stress Wife with diagnosis of Lewy body Retired Having to spend much of his time taking care of her  Borderline diabetes Recommended dietary changes, walking program    Total encounter time more than 30 minutes  Greater than 50% was spent in counseling and coordination of care with the patient     No orders of the defined types were placed in this encounter.    Signed, Esmond Plants, M.D., Ph.D. 04/02/2022  [place], [place]

## 2022-04-02 ENCOUNTER — Ambulatory Visit: Payer: Medicare Other | Attending: Cardiovascular Disease | Admitting: Cardiovascular Disease

## 2022-04-02 ENCOUNTER — Encounter: Payer: Self-pay | Admitting: Cardiovascular Disease

## 2022-04-02 VITALS — BP 140/72 | HR 70 | Ht 68.0 in | Wt 201.2 lb

## 2022-04-02 DIAGNOSIS — Z9861 Coronary angioplasty status: Secondary | ICD-10-CM | POA: Diagnosis present

## 2022-04-02 DIAGNOSIS — E1159 Type 2 diabetes mellitus with other circulatory complications: Secondary | ICD-10-CM | POA: Diagnosis present

## 2022-04-02 DIAGNOSIS — I10 Essential (primary) hypertension: Secondary | ICD-10-CM | POA: Insufficient documentation

## 2022-04-02 DIAGNOSIS — I25118 Atherosclerotic heart disease of native coronary artery with other forms of angina pectoris: Secondary | ICD-10-CM | POA: Insufficient documentation

## 2022-04-02 DIAGNOSIS — I2510 Atherosclerotic heart disease of native coronary artery without angina pectoris: Secondary | ICD-10-CM | POA: Insufficient documentation

## 2022-04-02 DIAGNOSIS — E782 Mixed hyperlipidemia: Secondary | ICD-10-CM | POA: Insufficient documentation

## 2022-04-02 NOTE — Patient Instructions (Addendum)
Medication Instructions:  No changes  If you need a refill on your cardiac medications before your next appointment, please call your pharmacy.   Lab work: No new labs needed  Testing/Procedures: No new testing needed  Follow-Up: At [facility], you and your health needs are our priority.  As part of our continuing mission to provide you with exceptional heart care, we have created designated Provider Care Teams.  These Care Teams include your primary Cardiologist (physician) and Advanced Practice Providers (APPs -  Physician Assistants and Nurse Practitioners) who all work together to provide you with the care you need, when you need it.  You will need a follow up appointment in 12 months  Providers on your designated Care Team:   Christopher Berge, NP Ryan Dunn, PA-C Cadence Furth, PA-C  COVID-19 Vaccine Information can be found at: [email or web address] For questions related to vaccine distribution or appointments, please email [email or web address] or call [phone].   

## 2022-04-06 ENCOUNTER — Other Ambulatory Visit: Payer: Self-pay | Admitting: Cardiovascular Disease

## 2022-04-06 ENCOUNTER — Other Ambulatory Visit: Payer: Self-pay | Admitting: Primary Care

## 2022-04-06 DIAGNOSIS — J302 Other seasonal allergic rhinitis: Secondary | ICD-10-CM

## 2022-04-12 ENCOUNTER — Other Ambulatory Visit: Payer: Self-pay | Admitting: Primary Care

## 2022-04-12 DIAGNOSIS — J302 Other seasonal allergic rhinitis: Secondary | ICD-10-CM

## 2022-04-13 ENCOUNTER — Ambulatory Visit: Payer: Medicare Other | Admitting: Internal Medicine

## 2022-04-13 ENCOUNTER — Other Ambulatory Visit: Payer: Self-pay | Admitting: Cardiovascular Disease

## 2022-04-13 ENCOUNTER — Other Ambulatory Visit: Payer: Self-pay | Admitting: Primary Care

## 2022-04-13 DIAGNOSIS — E119 Type 2 diabetes mellitus without complications: Secondary | ICD-10-CM

## 2022-04-13 DIAGNOSIS — K219 Gastro-esophageal reflux disease without esophagitis: Secondary | ICD-10-CM

## 2022-05-08 ENCOUNTER — Ambulatory Visit (INDEPENDENT_AMBULATORY_CARE_PROVIDER_SITE_OTHER): Payer: Medicare Other | Admitting: Primary Care

## 2022-05-08 ENCOUNTER — Other Ambulatory Visit: Payer: Self-pay | Admitting: Primary Care

## 2022-05-08 VITALS — BP 138/72 | HR 77 | Temp 97.2°F | Ht 69.0 in | Wt 202.0 lb

## 2022-05-08 DIAGNOSIS — E782 Mixed hyperlipidemia: Secondary | ICD-10-CM | POA: Diagnosis not present

## 2022-05-08 DIAGNOSIS — I10 Essential (primary) hypertension: Secondary | ICD-10-CM

## 2022-05-08 DIAGNOSIS — Z9861 Coronary angioplasty status: Secondary | ICD-10-CM

## 2022-05-08 DIAGNOSIS — E1165 Type 2 diabetes mellitus with hyperglycemia: Secondary | ICD-10-CM | POA: Diagnosis not present

## 2022-05-08 DIAGNOSIS — K219 Gastro-esophageal reflux disease without esophagitis: Secondary | ICD-10-CM | POA: Diagnosis not present

## 2022-05-08 DIAGNOSIS — I2510 Atherosclerotic heart disease of native coronary artery without angina pectoris: Secondary | ICD-10-CM

## 2022-05-08 DIAGNOSIS — Z125 Encounter for screening for malignant neoplasm of prostate: Secondary | ICD-10-CM | POA: Diagnosis not present

## 2022-05-08 DIAGNOSIS — Z638 Other specified problems related to primary support group: Secondary | ICD-10-CM

## 2022-05-08 DIAGNOSIS — Z86010 Personal history of colonic polyps: Secondary | ICD-10-CM

## 2022-05-08 DIAGNOSIS — R972 Elevated prostate specific antigen [PSA]: Secondary | ICD-10-CM

## 2022-05-08 LAB — LIPID PANEL
Cholesterol: 139 mg/dL (ref 0–200)
HDL: 35.8 mg/dL — ABNORMAL LOW (ref >39.00)
NonHDL: 103.62
Total CHOL/HDL Ratio: 4
Triglycerides: 217 mg/dL — ABNORMAL HIGH (ref 0.0–149.0)
VLDL: 43.4 mg/dL — ABNORMAL HIGH (ref 0.0–40.0)

## 2022-05-08 LAB — COMPREHENSIVE METABOLIC PANEL
ALT: 25 U/L (ref 0–53)
AST: 19 U/L (ref 0–37)
Albumin: 4.3 g/dL (ref 3.5–5.2)
Alkaline Phosphatase: 78 U/L (ref 39–117)
BUN: 20 mg/dL (ref 6–23)
CO2: 29 mEq/L (ref 19–32)
Calcium: 9.4 mg/dL (ref 8.4–10.5)
Chloride: 106 mEq/L (ref 96–112)
Creatinine, Ser: 1.18 mg/dL (ref 0.40–1.50)
GFR: 63.95 mL/min (ref >60.00)
Glucose, Bld: 117 mg/dL — ABNORMAL HIGH (ref 70–99)
Potassium: 4.4 mEq/L (ref 3.5–5.1)
Sodium: 142 mEq/L (ref 135–145)
Total Bilirubin: 0.5 mg/dL (ref 0.2–1.2)
Total Protein: 6.7 g/dL (ref 6.0–8.3)

## 2022-05-08 LAB — LDL CHOLESTEROL, DIRECT: Direct LDL: 81 mg/dL

## 2022-05-08 LAB — PSA, MEDICARE: PSA: 6.57 ng/ml — ABNORMAL HIGH (ref 0.10–4.00)

## 2022-05-08 LAB — HEMOGLOBIN A1C: Hgb A1c MFr Bld: 6.5 % (ref 4.6–6.5)

## 2022-05-08 MED ORDER — CITALOPRAM HYDROBROMIDE 20 MG PO TABS: 20.0000 mg | ORAL_TABLET | Freq: Every day | ORAL | 3 refills | Status: DC

## 2022-05-08 NOTE — Assessment & Plan Note (Signed)
Continue atorvastatin 40 mg daily. Repeat lipid panel pending. 

## 2022-05-08 NOTE — Assessment & Plan Note (Signed)
Controlled.  Continue pantoprazole 40 mg daily. 

## 2022-05-08 NOTE — Assessment & Plan Note (Signed)
Controlled.  Continue losartan 50 mg daily, carvedilol 3.125 mg BID.  CMP pending.

## 2022-05-08 NOTE — Assessment & Plan Note (Addendum)
Repeat A1C pending.  Continue metformin XR 500 mg daily.  Follow up in 6 months.

## 2022-05-08 NOTE — Progress Notes (Signed)
Subjective:    Patient ID: Alec Smith, male    DOB: Dec 26, 1955, 66 y.o.   MRN: [id number]  HPI  Alec Smith is a very pleasant 66 y.o. male with a history of hypertension, CAD, type 2 diabetes, GERD, hyperlipidemia insomnia, caregiver role strain who presents today for follow up of chronic conditions.  Immunizations: -Tetanus: 2019 -Influenza: Completed this season  -Shingles: Completed Shingrix series -Pneumonia: Prevnar 20 in 2022, Prevnar 13 in 2021, Pneumovax in 2016  Colonoscopy: Completed in 2016, due but cannot schedule yet as he cannot be away from his wife.  PSA: Due    1) Type 2 Diabetes:  Current medications include: metformin XR 500 mg daily   Last A1C: 6.1 in July 2023 Last Eye Exam: UTD Last Foot Exam: Due Pneumonia Vaccination: 2022 Urine Microalbumin: Due Statin: atorvastatin    2) CAD/Hypertension/Hyperlipidemia: Following with cardiology and is managed on carvedilol 3.1215 mg BID, atorvastatin 40 mg daily, losartan 50 mg daily, nitroglycerin 0.4 mg PRN. He denies chest pain, exertional dyspnea. He does feel flutters to his heart with anxiety. Symptoms resolve with improvement in anxiety.  3) GERD: Currently managed on pantoprazole 40 mg daily. Overall feels well managed on this regimen.   4) Caregiver Role Strain: Currently managed on citalopram 20 mg daily but he's not taken in months due to side effects of erectile dysfunction. He would like to resume given his ongoing anxiety with his wife's advancing dementia. His wife is also now in legal trouble which has caused tension. He is working to obtain guardianship so that he can help to make medical decisions.     Review of Systems  Constitutional:  Negative for unexpected weight change.  HENT:  Negative for rhinorrhea.   Respiratory:  Negative for cough and shortness of breath.   Cardiovascular:  Negative for chest pain.  Gastrointestinal:  Negative for constipation and diarrhea.   Genitourinary:  Negative for difficulty urinating.  Musculoskeletal:  Negative for arthralgias and myalgias.  Skin:  Negative for rash.  Allergic/Immunologic: Negative for environmental allergies.  Neurological:  Negative for dizziness and headaches.  Psychiatric/Behavioral:  The patient is nervous/anxious.          Past Medical History:  Diagnosis Date   CAD (coronary artery disease)    GERD (gastroesophageal reflux disease)    Hx of adenomatous polyp of colon 02/09/2015   Hyperlipidemia    Hypertension     Social History   Socioeconomic History   Marital status: Married    Spouse name: Not on file   Number of children: 2   Years of education: Not on file   Highest education level: Not on file  Occupational History   Occupation: school Scientist, physiological    Employer: [employer]  Tobacco Use   Smoking status: Never   Smokeless tobacco: Never  Vaping Use   Vaping Use: Never used  Substance and Sexual Activity   Alcohol use: Yes    Alcohol/week: 0.0 standard drinks of alcohol    Comment: once or twice a year   Drug use: No   Sexual activity: Not on file  Other Topics Concern   Not on file  Social History Narrative   Married.   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   01/21/2015   Social Determinants of Health   Financial Resource Strain: Low Risk  (09/04/2021)   Overall Financial Resource Strain (CARDIA)  Difficulty of Paying Living Expenses: Not hard at all  Food Insecurity: No Food Insecurity (09/04/2021)   Hunger Vital Sign    Worried About Running Out of Food in the Last Year: Never true    Ran Out of Food in the Last Year: Never true  Transportation Needs: No Transportation Needs (09/04/2021)   PRAPARE - Hydrologist (Medical): No    Lack of Transportation (Non-Medical): No  Physical Activity: Inactive (09/04/2021)   Exercise Vital Sign    Days of Exercise per  Week: 0 days    Minutes of Exercise per Session: 0 min  Stress: No Stress Concern Present (09/04/2021)   [place]    Feeling of Stress : Not at all  Social Connections: Moderately Isolated (09/04/2021)   Social Connection and Isolation Panel [NHANES]    Frequency of Communication with Friends and Family: More than three times a week    Frequency of Social Gatherings with Friends and Family: More than three times a week    Attends Religious Services: Never    Marine scientist or Organizations: No    Attends Archivist Meetings: Never    Marital Status: Married  Human resources officer Violence: Not At Risk (09/04/2021)   Humiliation, Afraid, Rape, and Kick questionnaire    Fear of Current or Ex-Partner: No    Emotionally Abused: No    Physically Abused: No    Sexually Abused: No    Past Surgical History:  Procedure Laterality Date   APPENDECTOMY  1966   CORONARY ANGIOPLASTY WITH STENT PLACEMENT  2010   LUMBAR [place]   Lower back   PROSTATE BIOPSY     TONSILLECTOMY     age 53    Family History  Problem Relation Age of Onset   Aneurysm Mother 44       AAA   Heart failure Father    Prostate cancer Paternal Grandfather    Vaginal cancer Paternal Grandmother        spread to lungs   Colon cancer Neg Hx    Esophageal cancer Neg Hx    Stomach cancer Neg Hx    Rectal cancer Neg Hx     No Known Allergies  Current Outpatient Medications on File Prior to Visit  Medication Sig Dispense Refill   atorvastatin (LIPITOR) 40 MG tablet TAKE 1 TABLET BY MOUTH EVERY DAY 90 tablet 2   betamethasone dipropionate 0.05 % cream Apply topically 2 (two) times daily as needed.     carvedilol (COREG) 3.125 MG tablet TAKE 1 TABLET BY MOUTH TWICE A DAY WITH A MEAL 180 tablet 2   clopidogrel (PLAVIX) 75 MG tablet TAKE 1 TABLET BY MOUTH EVERY DAY 90 tablet 2   fluticasone (FLONASE) 50 MCG/ACT nasal spray PLACE 1  SPRAY INTO BOTH NOSTRILS 2 (TWO) TIMES DAILY 48 mL 0   KRILL OIL PO Take 300 mg by mouth daily.     levocetirizine (XYZAL) 5 MG tablet TAKE 1 TABLET BY MOUTH EVERY DAY IN THE EVENING FOR ALLERGIES 90 tablet 0   losartan (COZAAR) 50 MG tablet TAKE 1 TABLET BY MOUTH EVERY DAY 90 tablet 2   metFORMIN (GLUCOPHAGE-XR) 500 MG 24 hr tablet TAKE 1 TABLET BY MOUTH DAILY WITH BREAKFAST FOR DIABETES. 90 tablet 0   Multiple Vitamin (MULTIVITAMIN WITH MINERALS) TABS tablet Take 1 tablet by mouth daily.     nitroGLYCERIN (NITROSTAT) 0.4 MG SL  tablet Place 1 tablet (0.4 mg total) under the tongue every 5 (five) minutes as needed for chest pain. 25 tablet 3   pantoprazole (PROTONIX) 40 MG tablet TAKE 1 TABLET (40 MG TOTAL) BY MOUTH DAILY. FOR HEARTBURN. 90 tablet 0   No current facility-administered medications on file prior to visit.    BP 138/72 (BP Location: Left Arm, Patient Position: Sitting, Cuff Size: Normal)   Pulse 77   Temp (!) 97.2 F (36.2 C) (Temporal)   Ht '5\' 9"'$  (1.753 m)   Wt 202 lb (91.6 kg)   SpO2 96%   BMI 29.83 kg/m  Objective:   Physical Exam HENT:     Right Ear: Tympanic membrane and ear canal normal.     Left Ear: Tympanic membrane and ear canal normal.     Nose: Nose normal.     Right Sinus: No maxillary sinus tenderness or frontal sinus tenderness.     Left Sinus: No maxillary sinus tenderness or frontal sinus tenderness.  Eyes:     Conjunctiva/sclera: Conjunctivae normal.  Neck:     Thyroid: No thyromegaly.     Vascular: No carotid bruit.  Cardiovascular:     Rate and Rhythm: Normal rate and regular rhythm.     Heart sounds: Normal heart sounds.  Pulmonary:     Effort: Pulmonary effort is normal.     Breath sounds: Normal breath sounds. No wheezing or rales.  Abdominal:     General: Bowel sounds are normal.     Palpations: Abdomen is soft.     Tenderness: There is no abdominal tenderness.  Musculoskeletal:        General: Normal range of motion.     Cervical  back: Neck supple.  Skin:    General: Skin is warm and dry.  Neurological:     Mental Status: He is alert and oriented to person, place, and time.     Cranial Nerves: No cranial nerve deficit.     Deep Tendon Reflexes: Reflexes are normal and symmetric.  Psychiatric:        Mood and Affect: Mood normal.           Assessment & Plan:   Problem List Items Addressed This Visit       Cardiovascular and Mediastinum   Benign essential HTN - Primary    Controlled.  Continue losartan 50 mg daily, carvedilol 3.125 mg BID.  CMP pending.      Relevant Orders   Comprehensive metabolic panel   CAD S/P percutaneous coronary angioplasty/stents    Asymptomatic. Following with cardiology. Reviewed office notes from September 2023.  Continue carvedilol 3.125 mg BID, clopidogrel 75 mg daily, atorvastatin 40 mg daily. Repeat lipid panel pending.          Digestive   GERD (gastroesophageal reflux disease)    Controlled.  Continue pantoprazole 40 mg daily.        Endocrine   Type 2 diabetes mellitus with hyperglycemia (HCC)    Repeat A1C pending.  Continue metformin XR 500 mg daily.  Follow up in 6 months.       Relevant Orders   Hemoglobin A1c     Other   Mixed hyperlipidemia    Continue atorvastatin 40 mg daily. Repeat lipid panel pending.      Relevant Orders   Lipid panel   Hx of adenomatous polyp of colon    Due for repeat colonoscopy, discussed today. He will update when ready.      Caregiver role  strain    Uncontrolled.   Resume citalopram 20 mg daily. Start with 10 mg daily x 1-2 weeks, then increase to 20 mg thereafter.      Relevant Medications   citalopram (CELEXA) 20 MG tablet   Other Visit Diagnoses     Screening for prostate cancer       Relevant Orders   PSA, Medicare          [name], NP

## 2022-05-08 NOTE — Assessment & Plan Note (Signed)
Uncontrolled.   Resume citalopram 20 mg daily. Start with 10 mg daily x 1-2 weeks, then increase to 20 mg thereafter.

## 2022-05-08 NOTE — Assessment & Plan Note (Signed)
Due for repeat colonoscopy, discussed today. He will update when ready.

## 2022-05-08 NOTE — Assessment & Plan Note (Signed)
Asymptomatic. Following with cardiology. Reviewed office notes from September 2023.  Continue carvedilol 3.125 mg BID, clopidogrel 75 mg daily, atorvastatin 40 mg daily. Repeat lipid panel pending.

## 2022-06-07 ENCOUNTER — Encounter: Payer: Medicare Other | Admitting: Primary Care

## 2022-07-05 ENCOUNTER — Other Ambulatory Visit: Payer: Self-pay | Admitting: Primary Care

## 2022-07-05 DIAGNOSIS — J302 Other seasonal allergic rhinitis: Secondary | ICD-10-CM

## 2022-07-09 ENCOUNTER — Other Ambulatory Visit: Payer: Self-pay | Admitting: Primary Care

## 2022-07-09 DIAGNOSIS — K219 Gastro-esophageal reflux disease without esophagitis: Secondary | ICD-10-CM

## 2022-07-09 DIAGNOSIS — J302 Other seasonal allergic rhinitis: Secondary | ICD-10-CM

## 2022-08-07 ENCOUNTER — Other Ambulatory Visit: Payer: Medicare Other

## 2022-08-08 ENCOUNTER — Other Ambulatory Visit (INDEPENDENT_AMBULATORY_CARE_PROVIDER_SITE_OTHER): Payer: Medicare Other

## 2022-08-08 DIAGNOSIS — R972 Elevated prostate specific antigen [PSA]: Secondary | ICD-10-CM

## 2022-08-09 LAB — PSA: PSA: 3.89 ng/mL (ref 0.10–4.00)

## 2022-09-14 ENCOUNTER — Encounter: Payer: Medicare Other | Admitting: Primary Care

## 2022-11-04 ENCOUNTER — Other Ambulatory Visit: Payer: Self-pay | Admitting: Primary Care

## 2022-11-04 DIAGNOSIS — E119 Type 2 diabetes mellitus without complications: Secondary | ICD-10-CM

## 2022-11-07 ENCOUNTER — Ambulatory Visit: Payer: Medicare Other | Admitting: Primary Care

## 2022-11-08 ENCOUNTER — Encounter: Payer: Self-pay | Admitting: Primary Care

## 2022-11-08 ENCOUNTER — Ambulatory Visit (INDEPENDENT_AMBULATORY_CARE_PROVIDER_SITE_OTHER): Payer: Medicare Other | Admitting: Primary Care

## 2022-11-08 VITALS — BP 128/72 | HR 62 | Temp 97.2°F | Ht 69.0 in | Wt 198.0 lb

## 2022-11-08 DIAGNOSIS — E1165 Type 2 diabetes mellitus with hyperglycemia: Secondary | ICD-10-CM | POA: Diagnosis not present

## 2022-11-08 DIAGNOSIS — Z7984 Long term (current) use of oral hypoglycemic drugs: Secondary | ICD-10-CM

## 2022-11-08 LAB — MICROALBUMIN / CREATININE URINE RATIO
Creatinine,U: 412.3 mg/dL
Microalb Creat Ratio: 0.2 mg/g (ref 0.0–30.0)
Microalb, Ur: 0.9 mg/dL (ref 0.0–1.9)

## 2022-11-08 LAB — POCT GLYCOSYLATED HEMOGLOBIN (HGB A1C): Hemoglobin A1C: 6.1 % — AB (ref 4.0–5.6)

## 2022-11-08 NOTE — Assessment & Plan Note (Signed)
Controlled.  Continue metformin XR 500 mg daily. Foot exam today. Urine microalbumin pending.   Follow up in 6 months.

## 2022-11-08 NOTE — Patient Instructions (Signed)
Stop by the lab prior to leaving today. I will notify you of your results once received.   Please schedule to meet with me in 6 months.   It was a pleasure to see you today!

## 2022-11-08 NOTE — Progress Notes (Signed)
Subjective:    Patient ID: Alec Smith, male    DOB: 06-15-1955, 67 y.o.   MRN: [id number]  HPI  Alec Smith is a very pleasant 67 y.o. male with a history of hypertension, CAD, type 2 diabetes, hyperlipidemia who presents today for follow-up of diabetes.  Current medications include: Metformin XR 500 mg daily  He is checking his blood glucose 0 times daily.  Last A1C: 6.5 in December 2023, 6.1 today.  Last Eye Exam: Due, he is scheduled for July Last Foot Exam: Due Pneumonia Vaccination: 2021 Urine Microalbumin: Due Statin: Atorvastatin  Dietary changes since last visit: Home cooked meals mostly.    Exercise: Active around the house and in the yard.  BP Readings from Last 3 Encounters:  11/08/22 128/72  05/08/22 138/72  04/02/22 (!) 140/72     Review of Systems  Respiratory:  Negative for shortness of breath.   Cardiovascular:  Negative for chest pain.  Neurological:  Negative for dizziness and numbness.         Past Medical History:  Diagnosis Date   CAD (coronary artery disease)    GERD (gastroesophageal reflux disease)    Hx of adenomatous polyp of colon 02/09/2015   Hyperlipidemia    Hypertension     Social History   Socioeconomic History   Marital status: Married    Spouse name: Not on file   Number of children: 2   Years of education: Not on file   Highest education level: Not on file  Occupational History   Occupation: school Production designer, theatre/television/film    Employer: [employer]  Tobacco Use   Smoking status: Never   Smokeless tobacco: Never  Vaping Use   Vaping Use: Never used  Substance and Sexual Activity   Alcohol use: Yes    Alcohol/week: 0.0 standard drinks of alcohol    Comment: once or twice a year   Drug use: No   Sexual activity: Not on file  Other Topics Concern   Not on file  Social History Narrative   Married.   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   01/21/2015    Social Determinants of Health   Financial Resource Strain: Low Risk  (09/04/2021)   Overall Financial Resource Strain (CARDIA)    Difficulty of Paying Living Expenses: Not hard at all  Food Insecurity: No Food Insecurity (09/04/2021)   Hunger Vital Sign    Worried About Running Out of Food in the Last Year: Never true    Ran Out of Food in the Last Year: Never true  Transportation Needs: No Transportation Needs (09/04/2021)   PRAPARE - Administrator, Civil Service (Medical): No    Lack of Transportation (Non-Medical): No  Physical Activity: Inactive (09/04/2021)   Exercise Vital Sign    Days of Exercise per Week: 0 days    Minutes of Exercise per Session: 0 min  Stress: No Stress Concern Present (09/04/2021)   [employer] of Occupational Health - Occupational Stress Questionnaire    Feeling of Stress : Not at all  Social Connections: Moderately Isolated (09/04/2021)   Social Connection and Isolation Panel [NHANES]    Frequency of Communication with Friends and Family: More than three times a week    Frequency of Social Gatherings with Friends and Family: More than three times a week    Attends Religious Services: Never    Active Member  of Clubs or Organizations: No    Attends Banker Meetings: Never    Marital Status: Married  Catering manager Violence: Not At Risk (09/04/2021)   Humiliation, Afraid, Rape, and Kick questionnaire    Fear of Current or Ex-Partner: No    Emotionally Abused: No    Physically Abused: No    Sexually Abused: No    Past Surgical History:  Procedure Laterality Date   APPENDECTOMY  1966   CORONARY ANGIOPLASTY WITH STENT PLACEMENT  2010   LUMBAR DISC SURGERY  2000   Lower back   PROSTATE BIOPSY     TONSILLECTOMY     age 67    Family History  Problem Relation Age of Onset   Aneurysm Mother 109       AAA   Heart failure Father    Prostate cancer Paternal Grandfather    Vaginal cancer Paternal Grandmother         spread to lungs   Colon cancer Neg Hx    Esophageal cancer Neg Hx    Stomach cancer Neg Hx    Rectal cancer Neg Hx     No Known Allergies  Current Outpatient Medications on File Prior to Visit  Medication Sig Dispense Refill   atorvastatin (LIPITOR) 40 MG tablet TAKE 1 TABLET BY MOUTH EVERY DAY 90 tablet 2   carvedilol (COREG) 3.125 MG tablet TAKE 1 TABLET BY MOUTH TWICE A DAY WITH A MEAL 180 tablet 2   clopidogrel (PLAVIX) 75 MG tablet TAKE 1 TABLET BY MOUTH EVERY DAY 90 tablet 2   fluticasone (FLONASE) 50 MCG/ACT nasal spray PLACE 1 SPRAY INTO BOTH NOSTRILS 2 (TWO) TIMES DAILY 48 mL 2   KRILL OIL PO Take 300 mg by mouth daily.     levocetirizine (XYZAL) 5 MG tablet TAKE 1 TABLET BY MOUTH EVERY DAY IN THE EVENING FOR ALLERGIES 90 tablet 2   losartan (COZAAR) 50 MG tablet TAKE 1 TABLET BY MOUTH EVERY DAY 90 tablet 2   metFORMIN (GLUCOPHAGE-XR) 500 MG 24 hr tablet TAKE 1 TABLET BY MOUTH DAILY WITH BREAKFAST FOR DIABETES. 90 tablet 0   Multiple Vitamin (MULTIVITAMIN WITH MINERALS) TABS tablet Take 1 tablet by mouth daily.     nitroGLYCERIN (NITROSTAT) 0.4 MG SL tablet Place 1 tablet (0.4 mg total) under the tongue every 5 (five) minutes as needed for chest pain. 25 tablet 3   pantoprazole (PROTONIX) 40 MG tablet TAKE 1 TABLET (40 MG TOTAL) BY MOUTH DAILY. FOR HEARTBURN. 90 tablet 2   betamethasone dipropionate 0.05 % cream Apply topically 2 (two) times daily as needed. (Patient not taking: Reported on 11/08/2022)     citalopram (CELEXA) 20 MG tablet Take 1 tablet (20 mg total) by mouth daily. for anxiety and depression. (Patient not taking: Reported on 11/08/2022) 90 tablet 3   No current facility-administered medications on file prior to visit.    BP 128/72   Pulse 62   Temp (!) 97.2 F (36.2 C) (Temporal)   Ht 5\' 9"  (1.753 m)   Wt 198 lb (89.8 kg)   SpO2 98%   BMI 29.24 kg/m  Objective:   Physical Exam Cardiovascular:     Rate and Rhythm: Normal rate and regular rhythm.   Pulmonary:     Effort: Pulmonary effort is normal.     Breath sounds: Normal breath sounds. No wheezing or rales.  Musculoskeletal:     Cervical back: Neck supple.  Skin:    General: Skin is warm and dry.  Neurological:     Mental Status: He is alert and oriented to person, place, and time.           Assessment & Plan:  Type 2 diabetes mellitus with hyperglycemia, without long-term current use of insulin (HCC) Assessment & Plan: Controlled.  Continue metformin XR 500 mg daily. Foot exam today. Urine microalbumin pending.   Follow up in 6 months.   Orders: -     POCT glycosylated hemoglobin (Hb A1C) -     Microalbumin / creatinine urine ratio        Doreene Nest, NP

## 2022-11-22 LAB — HM DIABETES EYE EXAM

## 2022-12-02 NOTE — Progress Notes (Unsigned)
    Marsella Suman T. Miata Culbreth, MD, CAQ Sports Medicine [facility] [address] [place], [address]  Phone: [phone]  FAX: [phone]  Alec Smith - 67 y.o. male  MRN [id number]  Date of Birth: [birth year]  Date: 12/05/2022  PCP: Doreene Nest, NP  Referral: Doreene Nest, NP  No chief complaint on file.  Subjective:   Alec Smith is a 67 y.o. very pleasant male patient with There is no height or weight on file to calculate BMI. who presents with the following:  He is a patient I remember well from prior office visit years ago.  He presents today with some ongoing hip and buttocks pain.    Review of Systems is noted in the HPI, as appropriate  Objective:   There were no vitals taken for this visit.  GEN: No acute distress; alert,appropriate. PULM: Breathing comfortably in no respiratory distress PSYCH: Normally interactive.   Laboratory and Imaging Data:  Assessment and Plan:   ***

## 2022-12-05 ENCOUNTER — Encounter: Payer: Self-pay | Admitting: Family Medicine

## 2022-12-05 ENCOUNTER — Ambulatory Visit (INDEPENDENT_AMBULATORY_CARE_PROVIDER_SITE_OTHER): Payer: Medicare Other | Admitting: Family Medicine

## 2022-12-05 VITALS — BP 140/80 | HR 62 | Temp 97.5°F | Ht 69.0 in | Wt 200.5 lb

## 2022-12-05 DIAGNOSIS — M25552 Pain in left hip: Secondary | ICD-10-CM

## 2022-12-05 DIAGNOSIS — E1165 Type 2 diabetes mellitus with hyperglycemia: Secondary | ICD-10-CM

## 2022-12-05 DIAGNOSIS — M7072 Other bursitis of hip, left hip: Secondary | ICD-10-CM

## 2022-12-05 MED ORDER — PREDNISONE 20 MG PO TABS
ORAL_TABLET | ORAL | 0 refills | Status: DC
Start: 2022-12-05 — End: 2022-12-24

## 2022-12-22 ENCOUNTER — Other Ambulatory Visit: Payer: Self-pay | Admitting: Family Medicine

## 2022-12-24 NOTE — Telephone Encounter (Signed)
Last office visit 12/05/22 for Bursitis of left hip.  Last refilled 12/05/22 for #1 with no refills.  Next appt: 05/10/23 for PCP for 6 month follow up.  Refill?

## 2023-02-02 ENCOUNTER — Other Ambulatory Visit: Payer: Self-pay | Admitting: Primary Care

## 2023-02-02 DIAGNOSIS — K219 Gastro-esophageal reflux disease without esophagitis: Secondary | ICD-10-CM

## 2023-02-03 ENCOUNTER — Other Ambulatory Visit: Payer: Self-pay | Admitting: Primary Care

## 2023-02-03 DIAGNOSIS — E119 Type 2 diabetes mellitus without complications: Secondary | ICD-10-CM

## 2023-02-21 NOTE — Progress Notes (Signed)
Patient ID: Alec Smith, male   DOB: [birth year], 67 y.o.   MRN: [id number] Cardiology Office Note  Date:  02/22/2023   ID:  Alec Smith, DOB 1955-09-30, MRN [id number]  PCP:  Doreene Nest, NP   Chief Complaint  Patient presents with   12 month follow up     Patient c/o elevated blood pressure lately. Medications reviewed by the patient verbally.     HPI:  Alec Smith is a pleasant 67 year old gentleman with history of  stress test October 21, 2008 showed anterior ischemia  Cath showing coronary artery disease,  stent placed to the proximal - mid LAD for occluded vessel,  residual RCA disease estimated at 60% with intervention performed in 2010.  He presents for follow-up of his coronary artery disease  Last seen by myself in clinic 11/23 Severe stress with wife, diagnosed with lewy body dementia She is having paranoia, delusion, parkinsons She is followed by [facility] neurology Disrupting his sleep  He is unable to exercise and do activities as he would like Likely from stress, blood pressure running higher in the late evenings  Labs: Hemoglobin A1c 6.1 Total cholesterol 139 LDL 81 Creatinine 1.18, BUN 20  EKG personally reviewed by myself on todays visit EKG Interpretation Date/Time:  Friday February 22 2023 15:29:02 EDT Ventricular Rate:  63 PR Interval:  244 QRS Duration:  98 QT Interval:  404 QTC Calculation: 413 R Axis:   -31  Text Interpretation: Sinus rhythm with 1st degree A-V block Left axis deviation When compared with ECG of 25-Feb-2015 15:25, No significant change was found Confirmed by Julien Nordmann [phone]) on 02/22/2023 4:01:38 PM   Cath report:  2010  a 1.5 x 10 apex crosswire  was used to ultimately gain access into the distal vessel.  This was  used to then dilate the chronically occluded segment.  A 2.0 x 30 apex  was then used to further dilate the segment from the point of distal  occlusion up to the point of proximal occlusion.   Overlapping 2.25 x 32  Taxus Adam drug-eluting stents were then deployed beginning distally and  then working proximally.  This was followed by 2.25 x 16 Taxus Adam  deployed at 16 and 18 atmospheres.  A 3.0 and 12 Taxus was then deployed  in the proximal LAD  that had 90% stenosis and the overlapping segment  was stented with a 2.75 x 8 Taxus.  The entire 2.25 segment was then  postdilated with a 2.5 x 25 [place] Voyager up to 16 and 18 atmospheres, the  more proximal segment was postdilated with a 2.75 x a 15 [place] Voyager and  the most proximal segment where the 3.0 stent was deployed was  postdilated with a 3.25 x 12 [place] Voyager.  PMH:   has a past medical history of CAD (coronary artery disease), GERD (gastroesophageal reflux disease), adenomatous polyp of colon (02/09/2015), Hyperlipidemia, and Hypertension.  PSH:    Past Surgical History:  Procedure Laterality Date   APPENDECTOMY  1966   CORONARY ANGIOPLASTY WITH STENT PLACEMENT  2010   LUMBAR DISC SURGERY  2000   Lower back   PROSTATE BIOPSY     TONSILLECTOMY     age 67    Current Outpatient Medications  Medication Sig Dispense Refill   atorvastatin (LIPITOR) 40 MG tablet TAKE 1 TABLET BY MOUTH EVERY DAY 90 tablet 2   carvedilol (COREG) 3.125 MG tablet TAKE 1 TABLET BY MOUTH TWICE A DAY WITH A  MEAL 180 tablet 2   clopidogrel (PLAVIX) 75 MG tablet TAKE 1 TABLET BY MOUTH EVERY DAY 90 tablet 2   fluticasone (FLONASE) 50 MCG/ACT nasal spray PLACE 1 SPRAY INTO BOTH NOSTRILS 2 (TWO) TIMES DAILY 48 mL 2   KRILL OIL PO Take 300 mg by mouth daily.     levocetirizine (XYZAL) 5 MG tablet TAKE 1 TABLET BY MOUTH EVERY DAY IN THE EVENING FOR ALLERGIES 90 tablet 2   losartan (COZAAR) 50 MG tablet TAKE 1 TABLET BY MOUTH EVERY DAY 90 tablet 2   metFORMIN (GLUCOPHAGE-XR) 500 MG 24 hr tablet TAKE 1 TABLET BY MOUTH DAILY WITH BREAKFAST FOR DIABETES. 90 tablet 0   Multiple Vitamin (MULTIVITAMIN WITH MINERALS) TABS tablet Take 1 tablet by mouth daily.      nitroGLYCERIN (NITROSTAT) 0.4 MG SL tablet Place 1 tablet (0.4 mg total) under the tongue every 5 (five) minutes as needed for chest pain. 25 tablet 3   pantoprazole (PROTONIX) 40 MG tablet TAKE 1 TABLET (40 MG TOTAL) BY MOUTH DAILY. FOR HEARTBURN. 90 tablet 2   No current facility-administered medications for this visit.    Allergies:   Patient has no known allergies.   Social History:  The patient  reports that he has never smoked. He has never used smokeless tobacco. He reports current alcohol use. He reports that he does not use drugs.   Family History:   family history includes Aneurysm (age of onset: 51) in his mother; Heart failure in his father; Prostate cancer in his paternal grandfather; Vaginal cancer in his paternal grandmother.   Review of Systems: Review of Systems  Constitutional: Negative.   Respiratory: Negative.    Cardiovascular: Negative.   Gastrointestinal: Negative.   Musculoskeletal: Negative.   Neurological: Negative.   Psychiatric/Behavioral: Negative.    All other systems reviewed and are negative.  PHYSICAL EXAM: VS:  BP 118/62 (BP Location: Left Arm, Patient Position: Sitting, Cuff Size: Normal)   Pulse 63   Ht 5\' 8"  (1.727 m)   Wt 197 lb (89.4 kg)   SpO2 98%   BMI 29.95 kg/m  , BMI Body mass index is 29.95 kg/m. Constitutional:  oriented to person, place, and time. No distress.  HENT:  Head: Grossly normal Eyes:  no discharge. No scleral icterus.  Neck: No JVD, no carotid bruits  Cardiovascular: Regular rate and rhythm, no murmurs appreciated Pulmonary/Chest: Clear to auscultation bilaterally, no wheezes or rails Abdominal: Soft.  no distension.  no tenderness.  Musculoskeletal: Normal range of motion Neurological:  normal muscle tone. Coordination normal. No atrophy Skin: Skin warm and dry Psychiatric: normal affect, pleasant  Recent Labs: 05/08/2022: ALT 25; BUN 20; Creatinine, Ser 1.18; Potassium 4.4; Sodium 142    Lipid Panel Lab  Results  Component Value Date   CHOL 139 05/08/2022   HDL 35.80 (L) 05/08/2022   LDLCALC 66 04/18/2021   TRIG 217.0 (H) 05/08/2022    Wt Readings from Last 3 Encounters:  02/22/23 197 lb (89.4 kg)  12/05/22 200 lb 8 oz (90.9 kg)  11/08/22 198 lb (89.8 kg)     ASSESSMENT AND PLAN:  Atherosclerosis of native coronary artery with angina pectoris, unspecified whether native or transplanted heart [facility])  Currently with no symptoms of angina. No further workup at this time. Continue current medication regimen.  Benign essential HTN -  Blood pressure well-controlled in the clinic today Likely higher in the evenings in the setting of stress Recommend he continue to monitor pressures  Hyperlipidemia Cholesterol  is at goal on the current lipid regimen. No changes to the medications were made.  Adjustment disorder/stress Wife with diagnosis of Lewy body Caretaker burnout  Borderline diabetes Recommended dietary changes, walking program Low carbohydrate diet     Orders Placed This Encounter  Procedures   EKG 12-Lead     Signed, Dossie Arbour, M.D., Ph.D. 02/22/2023  [facility] [place], [place] [phone]

## 2023-02-22 ENCOUNTER — Encounter: Payer: Self-pay | Admitting: Cardiovascular Disease

## 2023-02-22 ENCOUNTER — Ambulatory Visit: Payer: Medicare Other | Attending: Cardiovascular Disease | Admitting: Cardiovascular Disease

## 2023-02-22 VITALS — BP 118/62 | HR 63 | Ht 68.0 in | Wt 197.0 lb

## 2023-02-22 DIAGNOSIS — E1159 Type 2 diabetes mellitus with other circulatory complications: Secondary | ICD-10-CM

## 2023-02-22 DIAGNOSIS — E782 Mixed hyperlipidemia: Secondary | ICD-10-CM | POA: Diagnosis present

## 2023-02-22 DIAGNOSIS — I25118 Atherosclerotic heart disease of native coronary artery with other forms of angina pectoris: Secondary | ICD-10-CM | POA: Diagnosis present

## 2023-02-22 DIAGNOSIS — I2510 Atherosclerotic heart disease of native coronary artery without angina pectoris: Secondary | ICD-10-CM | POA: Diagnosis present

## 2023-02-22 DIAGNOSIS — Z9861 Coronary angioplasty status: Secondary | ICD-10-CM

## 2023-02-22 DIAGNOSIS — I10 Essential (primary) hypertension: Secondary | ICD-10-CM

## 2023-02-22 MED ORDER — ATORVASTATIN CALCIUM 40 MG PO TABS: 40.0000 mg | ORAL_TABLET | Freq: Every day | ORAL | 3 refills | Status: DC

## 2023-02-22 MED ORDER — LOSARTAN POTASSIUM 50 MG PO TABS: 50.0000 mg | ORAL_TABLET | Freq: Every day | ORAL | 3 refills | Status: DC

## 2023-02-22 MED ORDER — CLOPIDOGREL BISULFATE 75 MG PO TABS: 75.0000 mg | ORAL_TABLET | Freq: Every day | ORAL | 3 refills | Status: DC

## 2023-02-22 MED ORDER — CARVEDILOL 3.125 MG PO TABS: 3.1250 mg | ORAL_TABLET | Freq: Two times a day (BID) | ORAL | 3 refills | Status: DC

## 2023-02-22 MED ORDER — NITROGLYCERIN 0.4 MG SL SUBL: 0.4000 mg | SUBLINGUAL_TABLET | SUBLINGUAL | 3 refills | Status: AC | PRN

## 2023-02-22 NOTE — Patient Instructions (Signed)

## 2023-03-28 ENCOUNTER — Ambulatory Visit: Payer: Medicare Other | Admitting: Primary Care

## 2023-04-02 ENCOUNTER — Ambulatory Visit: Payer: Medicare Other | Admitting: Cardiovascular Disease

## 2023-04-05 ENCOUNTER — Other Ambulatory Visit: Payer: Self-pay | Admitting: Primary Care

## 2023-04-05 DIAGNOSIS — J302 Other seasonal allergic rhinitis: Secondary | ICD-10-CM

## 2023-04-09 ENCOUNTER — Ambulatory Visit: Payer: Medicare Other | Admitting: Primary Care

## 2023-04-09 ENCOUNTER — Ambulatory Visit (INDEPENDENT_AMBULATORY_CARE_PROVIDER_SITE_OTHER): Payer: Medicare Other | Admitting: Primary Care

## 2023-04-09 ENCOUNTER — Encounter: Payer: Self-pay | Admitting: Primary Care

## 2023-04-09 VITALS — BP 128/76 | HR 62 | Temp 97.1°F | Ht 68.0 in | Wt 194.0 lb

## 2023-04-09 DIAGNOSIS — Z638 Other specified problems related to primary support group: Secondary | ICD-10-CM | POA: Diagnosis not present

## 2023-04-09 MED ORDER — CITALOPRAM HYDROBROMIDE 10 MG PO TABS
10.0000 mg | ORAL_TABLET | Freq: Every day | ORAL | 0 refills | Status: DC
Start: 2023-04-09 — End: 2023-06-04

## 2023-04-09 NOTE — Assessment & Plan Note (Signed)
Deteriorated.  Citalopram effective at 20 mg dose, but caused ED side effects. Discussed options for treatment including trying a lower dose of citalopram at 10 mg vs trying fluoxetine.  He opts for citalopram 10 mg daily. Rx sent to pharmacy.  Follow up in 1 month

## 2023-04-09 NOTE — Patient Instructions (Signed)
Start citalopram 10 mg once daily.  Follow up in 1 month.  It was a pleasure to see you today!

## 2023-04-09 NOTE — Progress Notes (Signed)
Subjective:    Patient ID: Alec Smith, male    DOB: [birth year], 67 y.o.   MRN: [id number]  HPI  Alec Smith is a very pleasant 67 y.o. male with a history of hypertension, type 2 diabetes, GERD, CAD, hyperlipidemia, insomnia, caregiver role strain who presents today to discuss caregiver strain.  His wife was diagnosed with frontotemporal dementia vs Lewy Body dementia > 1 year ago. Over the last year his wife's symptoms have gradually progressed. Symptoms include hallucinations, paranoia, delusions. His wife constantly accuses him for "cheating" or taking her things. She's now getting frustrated and angry at other family members including her son and sister.   Symptoms currently include feeling agitated, irritability, short patience, frustration because he cannot go anywhere due to her paranoia. He was once managed on citalopram 20 mg which helped significantly but caused impotence. His impotence caused trouble with his wife. He did not do well on Lexapro.   Review of Systems  Respiratory:  Negative for shortness of breath.   Cardiovascular:  Negative for chest pain.  Psychiatric/Behavioral:  The patient is nervous/anxious.        See HPI         Past Medical History:  Diagnosis Date   CAD (coronary artery disease)    GERD (gastroesophageal reflux disease)    Hx of adenomatous polyp of colon 02/09/2015   Hyperlipidemia    Hypertension     Social History   Socioeconomic History   Marital status: Married    Spouse name: Not on file   Number of children: 2   Years of education: Not on file   Highest education level: Not on file  Occupational History   Occupation: school Production designer, theatre/television/film    Employer: [employer]  Tobacco Use   Smoking status: Never   Smokeless tobacco: Never  Vaping Use   Vaping status: Never Used  Substance and Sexual Activity   Alcohol use: Yes    Alcohol/week: 0.0 standard drinks of alcohol    Comment: once or twice a year   Drug use: No    Sexual activity: Not on file  Other Topics Concern   Not on file  Social History Narrative   Married.   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   01/21/2015   Social Determinants of Health   Financial Resource Strain: Low Risk  (09/04/2021)   Overall Financial Resource Strain (CARDIA)    Difficulty of Paying Living Expenses: Not hard at all  Food Insecurity: No Food Insecurity (09/04/2021)   Hunger Vital Sign    Worried About Running Out of Food in the Last Year: Never true    Ran Out of Food in the Last Year: Never true  Transportation Needs: No Transportation Needs (09/04/2021)   PRAPARE - Administrator, Civil Service (Medical): No    Lack of Transportation (Non-Medical): No  Physical Activity: Inactive (09/04/2021)   Exercise Vital Sign    Days of Exercise per Week: 0 days    Minutes of Exercise per Session: 0 min  Stress: No Stress Concern Present (09/04/2021)   [employer] of Occupational Health - Occupational Stress Questionnaire    Feeling of Stress : Not at all  Social Connections: Moderately Isolated (09/04/2021)   Social Connection and Isolation Panel [NHANES]    Frequency of Communication with Friends and Family: More than three times a week  Frequency of Social Gatherings with Friends and Family: More than three times a week    Attends Religious Services: Never    Database administrator or Organizations: No    Attends Banker Meetings: Never    Marital Status: Married  Catering manager Violence: Not At Risk (09/04/2021)   Humiliation, Afraid, Rape, and Kick questionnaire    Fear of Current or Ex-Partner: No    Emotionally Abused: No    Physically Abused: No    Sexually Abused: No    Past Surgical History:  Procedure Laterality Date   APPENDECTOMY  1966   CORONARY ANGIOPLASTY WITH STENT PLACEMENT  2010   LUMBAR DISC SURGERY  2000   Lower back   PROSTATE BIOPSY      TONSILLECTOMY     age 75    Family History  Problem Relation Age of Onset   Aneurysm Mother 27       AAA   Heart failure Father    Prostate cancer Paternal Grandfather    Vaginal cancer Paternal Grandmother        spread to lungs   Colon cancer Neg Hx    Esophageal cancer Neg Hx    Stomach cancer Neg Hx    Rectal cancer Neg Hx     No Known Allergies  Current Outpatient Medications on File Prior to Visit  Medication Sig Dispense Refill   atorvastatin (LIPITOR) 40 MG tablet Take 1 tablet (40 mg total) by mouth daily. 90 tablet 3   carvedilol (COREG) 3.125 MG tablet Take 1 tablet (3.125 mg total) by mouth 2 (two) times daily with a meal. 180 tablet 3   clopidogrel (PLAVIX) 75 MG tablet Take 1 tablet (75 mg total) by mouth daily. 90 tablet 3   fluticasone (FLONASE) 50 MCG/ACT nasal spray PLACE 1 SPRAY INTO BOTH NOSTRILS 2 (TWO) TIMES DAILY 48 mL 0   KRILL OIL PO Take 300 mg by mouth daily.     levocetirizine (XYZAL) 5 MG tablet TAKE 1 TABLET BY MOUTH EVERY DAY IN THE EVENING FOR ALLERGIES 90 tablet 2   losartan (COZAAR) 50 MG tablet Take 1 tablet (50 mg total) by mouth daily. 90 tablet 3   metFORMIN (GLUCOPHAGE-XR) 500 MG 24 hr tablet TAKE 1 TABLET BY MOUTH DAILY WITH BREAKFAST FOR DIABETES. 90 tablet 0   Multiple Vitamin (MULTIVITAMIN WITH MINERALS) TABS tablet Take 1 tablet by mouth daily.     nitroGLYCERIN (NITROSTAT) 0.4 MG SL tablet Place 1 tablet (0.4 mg total) under the tongue every 5 (five) minutes as needed for chest pain. 25 tablet 3   pantoprazole (PROTONIX) 40 MG tablet TAKE 1 TABLET (40 MG TOTAL) BY MOUTH DAILY. FOR HEARTBURN. 90 tablet 2   No current facility-administered medications on file prior to visit.    BP 128/76   Pulse 62   Temp (!) 97.1 F (36.2 C) (Temporal)   Ht 5\' 8"  (1.727 m)   Wt 194 lb (88 kg)   SpO2 98%   BMI 29.50 kg/m  Objective:   Physical Exam Cardiovascular:     Rate and Rhythm: Normal rate and regular rhythm.  Pulmonary:     Effort:  Pulmonary effort is normal.     Breath sounds: Normal breath sounds.  Musculoskeletal:     Cervical back: Neck supple.  Skin:    General: Skin is warm and dry.  Neurological:     Mental Status: He is alert and oriented to person, place, and time.  Psychiatric:  Mood and Affect: Mood normal.           Assessment & Plan:  Caregiver role strain Assessment & Plan: Deteriorated.  Citalopram effective at 20 mg dose, but caused ED side effects. Discussed options for treatment including trying a lower dose of citalopram at 10 mg vs trying fluoxetine.  He opts for citalopram 10 mg daily. Rx sent to pharmacy.  Follow up in 1 month  Orders: -     Citalopram Hydrobromide; Take 1 tablet (10 mg total) by mouth daily. For anxiety  Dispense: 90 tablet; Refill: 0        Doreene Nest, NP

## 2023-04-20 DIAGNOSIS — K219 Gastro-esophageal reflux disease without esophagitis: Secondary | ICD-10-CM

## 2023-04-21 MED ORDER — PANTOPRAZOLE SODIUM 40 MG PO TBEC: 40.0000 mg | DELAYED_RELEASE_TABLET | Freq: Every day | ORAL | 0 refills | Status: DC

## 2023-04-25 ENCOUNTER — Telehealth: Payer: Self-pay | Admitting: Primary Care

## 2023-04-25 NOTE — Telephone Encounter (Signed)
Called and spoke to patients wife, advised Jae Dire is out of the office and responses will be delayed.   She requested the following information be given to Jae Dire: She is very disappointed in the treatment plan/medication Jae Dire put the patient on during his last office visit for memory loss. She stated all this does is make him drowsy. She states that they both have discussed how they feel "Jae Dire missed the [facility] with his memory issues and he needs to be treated by a MD". She stated the patient wanted a referral when here for OV but it did not get done. Patients wife stated she recently saw Jae Dire and during her appointment she discussed with Jae Dire her concerns regarding her husbands memory and was assured by Jae Dire that she would take care of it. Patients wife states "She obviously did not handle it correctly and we are disappointed" She is requesting the referral be put in now for memory loss.   I did not feel it appropriate to advise wife the medication patient was prescribed is not for memory, but anxiety. I requested to speak with the patient and the patients wife stated patient was unavailable at the time but that he agrees with everything she has said.   Patients wife is also a patient of Isabella Stalling, forwarding to Jae Dire as she is aware of patient and patients wife history.

## 2023-04-25 NOTE — Telephone Encounter (Signed)
To patient's wife. Please let her know that her husband and I agreed to follow up regarding his symptoms in a few weeks during his annual visit. Please have her discuss this with her husband (the patient).    For documentation:  Patient and I agreed to the prescribed regimen during his visit on 04/09/23. He has caregiver role strain. He does not have memory loss, rather, his wife has been diagnosed with frontotemporal dementia, Lewy Body dementia, and mild cognitive dysfunction from multiple specialists including neurology and the [facility] clinic. The patient and I agreed to meet again regarding his caregiver role strain in December as scheduled. See office notes from 04/09/23.  Will also copy Vernona Rieger, our Research officer, political party as [employer] Child[employer]. Vernona Rieger, the patient's wife's name is Jovontae Cobaugh.

## 2023-04-25 NOTE — Telephone Encounter (Signed)
Pt's wife, Meriam Sprague, called asking if Chestine Spore could submit a referral to a specialist that deals with memory loss? Pt saw Clark on 11/26 for memory concerns. Call back # [phone]

## 2023-04-26 NOTE — Telephone Encounter (Signed)
Called and spoke with patients wife, advised of Alec Smith message. Patient wife stated again that her husband requested her to call Alec Smith and request the referral before patients appt. Advised that this can be discussed with Alec Smith and the patient during his annual visit per [employer]. Patients wife stated "if Alec Smith is not willing to place referral we will have to go somewhere else to get help." I advised patients wife that I was not saying Alec Smith is not going to submit referral, only that she would like to discuss and follow up on symptoms at patients upcoming visit on the 27th. I asked if the patient was available to speak with me so that I can explain to him as well and she said that he was just now getting up and not able to come to the phone. She stated " he is perfectly capable of understanding what I tell him from this conversation". I reiterated to the patients wife that Alec Smith will follow up on the symptoms the patient is experiencing during his upcoming annual visit. Patients wife thanked me for calling her back and wanted to express her thanks to [place] for all of the assistance she has provided them.   Sending copy to Alec Smith, Research officer, political party and adding Environmental education officer, Alec Smith so that she is aware.

## 2023-04-27 NOTE — Telephone Encounter (Signed)
Noted. I will follow up with the patient as scheduled.

## 2023-05-02 ENCOUNTER — Other Ambulatory Visit: Payer: Self-pay | Admitting: Primary Care

## 2023-05-02 DIAGNOSIS — E119 Type 2 diabetes mellitus without complications: Secondary | ICD-10-CM

## 2023-05-10 ENCOUNTER — Encounter: Payer: Self-pay | Admitting: Urology

## 2023-05-10 ENCOUNTER — Ambulatory Visit (INDEPENDENT_AMBULATORY_CARE_PROVIDER_SITE_OTHER): Payer: Medicare Other | Admitting: Urology

## 2023-05-10 ENCOUNTER — Ambulatory Visit: Payer: Medicare Other | Admitting: Primary Care

## 2023-05-10 VITALS — BP 155/85 | HR 65

## 2023-05-10 DIAGNOSIS — N5201 Erectile dysfunction due to arterial insufficiency: Secondary | ICD-10-CM | POA: Diagnosis not present

## 2023-05-10 DIAGNOSIS — Z87898 Personal history of other specified conditions: Secondary | ICD-10-CM | POA: Diagnosis not present

## 2023-05-10 MED ORDER — SILDENAFIL CITRATE 100 MG PO TABS: 100.0000 mg | ORAL_TABLET | Freq: Every day | ORAL | 0 refills | Status: DC | PRN

## 2023-05-10 NOTE — Progress Notes (Signed)
I, Alec Smith, acting as a scribe for Alec Altes, MD., have documented all relevant documentation on the behalf of Alec Altes, MD, as directed by Alec Altes, MD while in the presence of Alec Altes, MD.  05/10/2023 11:17 AM   Alec Smith 1956/01/27 [id number]  Referring provider: Doreene Nest, NP [address] [place],  [place] [address]  Chief Complaint  Patient presents with   Follow-up    HPI: Alec Smith is a 67 y.o. male presents to transfer urologic care.   Previously followed by Dr. Retta Smith at [facility] in [place] for an elevated PSA and erectile dysfunction.  TRUS/biopsy prostate January 2019 for PSA of 5.5; prostate volume 34.6 cc. 12/12 cores negative for prostate cancer and 1 core showing high grade PIN. PSA has been stable since that time.  Also followed for erectile dysfunction using sildenafil.  Dr. Retta Smith recently retired. He lives locally and wanted to transfer care. PSA checked by his PCP March 2024 was 3.89 No bothersome lower urinary tract symptoms.  Denies dysuria, gross hematuria.   PSA trend   PSA  Latest Ref Rng 0.10 - 4.00 ng/mL  02/20/2019 3.19   11/20/2019 4.13 (E)  02/22/2020 3.49   04/18/2021 3.88   05/08/2022 6.57 (H)   08/08/2022 3.89     PMH: Past Medical History:  Diagnosis Date   CAD (coronary artery disease)    GERD (gastroesophageal reflux disease)    Hx of adenomatous polyp of colon 02/09/2015   Hyperlipidemia    Hypertension     Surgical History: Past Surgical History:  Procedure Laterality Date   APPENDECTOMY  1966   CORONARY ANGIOPLASTY WITH STENT PLACEMENT  2010   LUMBAR DISC SURGERY  2000   Lower back   PROSTATE BIOPSY     TONSILLECTOMY     age 78    Home Medications:  Allergies as of 05/10/2023   No Known Allergies      Medication List        Accurate as of May 10, 2023 11:17 AM. If you have any questions, ask your nurse or doctor.           atorvastatin 40 MG tablet Commonly known as: LIPITOR Take 1 tablet (40 mg total) by mouth daily.   carvedilol 3.125 MG tablet Commonly known as: COREG Take 1 tablet (3.125 mg total) by mouth 2 (two) times daily with a meal.   citalopram 10 MG tablet Commonly known as: CELEXA Take 1 tablet (10 mg total) by mouth daily. For anxiety   clopidogrel 75 MG tablet Commonly known as: PLAVIX Take 1 tablet (75 mg total) by mouth daily.   fluticasone 50 MCG/ACT nasal spray Commonly known as: FLONASE PLACE 1 SPRAY INTO BOTH NOSTRILS 2 (TWO) TIMES DAILY   KRILL OIL PO Take 300 mg by mouth daily.   levocetirizine 5 MG tablet Commonly known as: XYZAL TAKE 1 TABLET BY MOUTH EVERY DAY IN THE EVENING FOR ALLERGIES   losartan 50 MG tablet Commonly known as: COZAAR Take 1 tablet (50 mg total) by mouth daily.   metFORMIN 500 MG 24 hr tablet Commonly known as: GLUCOPHAGE-XR TAKE 1 TABLET BY MOUTH DAILY WITH BREAKFAST FOR DIABETES.   multivitamin with minerals Tabs tablet Take 1 tablet by mouth daily.   nitroGLYCERIN 0.4 MG SL tablet Commonly known as: NITROSTAT Place 1 tablet (0.4 mg total) under the tongue every 5 (five) minutes as needed for chest pain.  pantoprazole 40 MG tablet Commonly known as: PROTONIX Take 1 tablet (40 mg total) by mouth daily. For heartburn.   sildenafil 100 MG tablet Commonly known as: VIAGRA Take 1 tablet (100 mg total) by mouth daily as needed for erectile dysfunction. Started by: Alec Smith        Allergies: No Known Allergies  Family History: Family History  Problem Relation Age of Onset   Aneurysm Mother 89       AAA   Heart failure Father    Prostate cancer Paternal Grandfather    Vaginal cancer Paternal Grandmother        spread to lungs   Colon cancer Neg Hx    Esophageal cancer Neg Hx    Stomach cancer Neg Hx    Rectal cancer Neg Hx     Social History:  reports that he has never smoked. He has never used smokeless tobacco.  He reports current alcohol use. He reports that he does not use drugs.   Physical Exam: BP (!) 155/85   Pulse 65   Constitutional:  Alert and oriented, No acute distress. HEENT: [place] AT Respiratory: Normal respiratory effort, no increased work of breathing. GU: Patient deferred DRE today. Psychiatric: Normal mood and affect.   Assessment & Plan:    1. History elevated PSA Benign prostate biopsy 2019 for PSA 5.5  2. Erectile dysfunction Sildenafil 100 mg refill  Continue annual follow-up.  [facility] [address] [place], [place] [address] [phone]

## 2023-05-23 ENCOUNTER — Other Ambulatory Visit: Payer: Self-pay | Admitting: Primary Care

## 2023-05-23 DIAGNOSIS — J302 Other seasonal allergic rhinitis: Secondary | ICD-10-CM

## 2023-05-23 NOTE — Telephone Encounter (Signed)
 Lvmtcb, sent mychart message

## 2023-05-23 NOTE — Telephone Encounter (Signed)
Patient is due for follow up, this will be required prior to any further refills.  Please schedule, thank you!    

## 2023-06-04 ENCOUNTER — Encounter: Payer: Self-pay | Admitting: Primary Care

## 2023-06-04 ENCOUNTER — Ambulatory Visit (INDEPENDENT_AMBULATORY_CARE_PROVIDER_SITE_OTHER): Payer: Medicare Other | Admitting: Primary Care

## 2023-06-04 VITALS — BP 136/72 | HR 62 | Temp 97.8°F | Ht 68.0 in | Wt 200.0 lb

## 2023-06-04 DIAGNOSIS — I10 Essential (primary) hypertension: Secondary | ICD-10-CM | POA: Diagnosis not present

## 2023-06-04 DIAGNOSIS — K219 Gastro-esophageal reflux disease without esophagitis: Secondary | ICD-10-CM

## 2023-06-04 DIAGNOSIS — E782 Mixed hyperlipidemia: Secondary | ICD-10-CM

## 2023-06-04 DIAGNOSIS — Z1211 Encounter for screening for malignant neoplasm of colon: Secondary | ICD-10-CM

## 2023-06-04 DIAGNOSIS — E1165 Type 2 diabetes mellitus with hyperglycemia: Secondary | ICD-10-CM

## 2023-06-04 DIAGNOSIS — Z125 Encounter for screening for malignant neoplasm of prostate: Secondary | ICD-10-CM | POA: Diagnosis not present

## 2023-06-04 DIAGNOSIS — Z638 Other specified problems related to primary support group: Secondary | ICD-10-CM

## 2023-06-04 DIAGNOSIS — I2510 Atherosclerotic heart disease of native coronary artery without angina pectoris: Secondary | ICD-10-CM

## 2023-06-04 DIAGNOSIS — R972 Elevated prostate specific antigen [PSA]: Secondary | ICD-10-CM | POA: Diagnosis not present

## 2023-06-04 DIAGNOSIS — Z9861 Coronary angioplasty status: Secondary | ICD-10-CM

## 2023-06-04 DIAGNOSIS — Z7984 Long term (current) use of oral hypoglycemic drugs: Secondary | ICD-10-CM

## 2023-06-04 LAB — COMPREHENSIVE METABOLIC PANEL
ALT: 21 U/L (ref 0–53)
AST: 18 U/L (ref 0–37)
Albumin: 4.5 g/dL (ref 3.5–5.2)
Alkaline Phosphatase: 77 U/L (ref 39–117)
BUN: 21 mg/dL (ref 6–23)
CO2: 30 meq/L (ref 19–32)
Calcium: 9.5 mg/dL (ref 8.4–10.5)
Chloride: 104 meq/L (ref 96–112)
Creatinine, Ser: 1.12 mg/dL (ref 0.40–1.50)
GFR: 67.57 mL/min (ref >60.00)
Glucose, Bld: 102 mg/dL — ABNORMAL HIGH (ref 70–99)
Potassium: 4.5 meq/L (ref 3.5–5.1)
Sodium: 139 meq/L (ref 135–145)
Total Bilirubin: 0.5 mg/dL (ref 0.2–1.2)
Total Protein: 6.8 g/dL (ref 6.0–8.3)

## 2023-06-04 LAB — LIPID PANEL
Cholesterol: 138 mg/dL (ref 0–200)
HDL: 36.9 mg/dL — ABNORMAL LOW (ref >39.00)
LDL Cholesterol: 63 mg/dL (ref 0–99)
NonHDL: 100.76
Total CHOL/HDL Ratio: 4
Triglycerides: 188 mg/dL — ABNORMAL HIGH (ref 0.0–149.0)
VLDL: 37.6 mg/dL (ref 0.0–40.0)

## 2023-06-04 LAB — PSA, MEDICARE: PSA: 5.35 ng/mL — ABNORMAL HIGH (ref 0.10–4.00)

## 2023-06-04 LAB — HEMOGLOBIN A1C: Hgb A1c MFr Bld: 6.4 % (ref 4.6–6.5)

## 2023-06-04 NOTE — Assessment & Plan Note (Signed)
Intolerant to citalopram.  Remain off.

## 2023-06-04 NOTE — Assessment & Plan Note (Signed)
Repeat lipid panel pending. Continue atorvastatin 40 mg daily.

## 2023-06-04 NOTE — Assessment & Plan Note (Signed)
Controlled.  Continue losartan 50 mg daily, carvedilol 3.125 mg twice daily. CMP pending.

## 2023-06-04 NOTE — Assessment & Plan Note (Signed)
Controlled.  We discussed the potential dose reduction to 20 mg daily, however, he kindly declines and would like to continue at current dose. Continue pantoprazole 40 mg daily.

## 2023-06-04 NOTE — Assessment & Plan Note (Signed)
Asymptomatic. Following with cardiology, office notes reviewed from October 2024.  Lipids and A1c pending today.

## 2023-06-04 NOTE — Progress Notes (Signed)
Subjective:    Patient ID: Alec Smith, male    DOB: [birth year], 68 y.o.   MRN: [id number]  HPI  Alec Smith is a very pleasant 68 y.o. male with a history of hypertension, type 2 diabetes, hyperlipidemia, GERD, CAD who presents today for follow-up of chronic conditions.  1) CAD/Hypertension/Hyperlipidemia: Currently managed on atorvastatin 40 mg daily, carvedilol 3.125 mg twice daily, clopidogrel 75 mg daily, losartan 50 mg daily.   He denies chest pain, shortness of breath, headaches, dizziness.   Following with cardiology, last office visit was in October 2024, no changes made to his regimen.   BP Readings from Last 3 Encounters:  06/04/23 136/72  05/10/23 (!) 155/85  04/09/23 128/76   2) Type 2 Diabetes:   Current medications include: metformin XR 500 mg daily  He is checking his blood glucose 0 times daily.  He denies numbness to his feet.   Last A1C: 6.1 in June 2024 Last Eye Exam: UTD Last Foot Exam: UTD Pneumonia Vaccination: 2021 Urine Microalbumin: UTD Statin: atorvastatin   Dietary changes since last visit: None.    Exercise: None  3) Caregiver Stress: Currently prescribed on citalopram 10 mg daily. He stopped taking citalopram about 1 month ago as he didn't like the way it made him feel.   4) GERD: Currently managed on pantoprazole 40 mg daily for which he has taken for years. Overall well controlled. He does notice breakthrough symptoms if he eats something spicy or acidic.   Review of Systems  Constitutional:  Negative for unexpected weight change.  HENT:  Negative for rhinorrhea.   Respiratory:  Negative for cough and shortness of breath.   Cardiovascular:  Negative for chest pain.  Gastrointestinal:  Negative for constipation and diarrhea.  Genitourinary:  Negative for difficulty urinating.  Musculoskeletal:  Negative for arthralgias and myalgias.  Skin:  Negative for rash.  Allergic/Immunologic: Positive for environmental allergies.   Neurological:  Negative for dizziness, numbness and headaches.  Psychiatric/Behavioral:  The patient is not nervous/anxious.          Past Medical History:  Diagnosis Date   CAD (coronary artery disease)    GERD (gastroesophageal reflux disease)    Hx of adenomatous polyp of colon 02/09/2015   Hyperlipidemia    Hypertension     Social History   Socioeconomic History   Marital status: Married    Spouse name: Not on file   Number of children: 2   Years of education: Not on file   Highest education level: Not on file  Occupational History   Occupation: school Production designer, theatre/television/film    Employer: [employer]  Tobacco Use   Smoking status: Never   Smokeless tobacco: Never  Vaping Use   Vaping status: Never Used  Substance and Sexual Activity   Alcohol use: Yes    Alcohol/week: 0.0 standard drinks of alcohol    Comment: once or twice a year   Drug use: No   Sexual activity: Not on file  Other Topics Concern   Not on file  Social History Narrative   Married.   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   01/21/2015   Social Drivers of Health   Financial Resource Strain: Low Risk  (09/04/2021)   Overall Financial Resource Strain (CARDIA)    Difficulty of Paying Living Expenses: Not hard at all  Food Insecurity: No Food Insecurity (09/04/2021)   Hunger  Vital Sign    Worried About Programme researcher, broadcasting/film/video in the Last Year: Never true    Ran Out of Food in the Last Year: Never true  Transportation Needs: No Transportation Needs (09/04/2021)   PRAPARE - Administrator, Civil Service (Medical): No    Lack of Transportation (Non-Medical): No  Physical Activity: Inactive (09/04/2021)   Exercise Vital Sign    Days of Exercise per Week: 0 days    Minutes of Exercise per Session: 0 min  Stress: No Stress Concern Present (09/04/2021)   [employer] of Occupational Health - Occupational Stress Questionnaire     Feeling of Stress : Not at all  Social Connections: Moderately Isolated (09/04/2021)   Social Connection and Isolation Panel [NHANES]    Frequency of Communication with Friends and Family: More than three times a week    Frequency of Social Gatherings with Friends and Family: More than three times a week    Attends Religious Services: Never    Database administrator or Organizations: No    Attends Banker Meetings: Never    Marital Status: Married  Catering manager Violence: Not At Risk (09/04/2021)   Humiliation, Afraid, Rape, and Kick questionnaire    Fear of Current or Ex-Partner: No    Emotionally Abused: No    Physically Abused: No    Sexually Abused: No    Past Surgical History:  Procedure Laterality Date   APPENDECTOMY  1966   CORONARY ANGIOPLASTY WITH STENT PLACEMENT  2010   LUMBAR DISC SURGERY  2000   Lower back   PROSTATE BIOPSY     TONSILLECTOMY     age 64    Family History  Problem Relation Age of Onset   Aneurysm Mother 46       AAA   Heart failure Father    Prostate cancer Paternal Grandfather    Vaginal cancer Paternal Grandmother        spread to lungs   Colon cancer Neg Hx    Esophageal cancer Neg Hx    Stomach cancer Neg Hx    Rectal cancer Neg Hx     No Known Allergies  Current Outpatient Medications on File Prior to Visit  Medication Sig Dispense Refill   atorvastatin (LIPITOR) 40 MG tablet Take 1 tablet (40 mg total) by mouth daily. 90 tablet 3   carvedilol (COREG) 3.125 MG tablet Take 1 tablet (3.125 mg total) by mouth 2 (two) times daily with a meal. 180 tablet 3   clopidogrel (PLAVIX) 75 MG tablet Take 1 tablet (75 mg total) by mouth daily. 90 tablet 3   fluticasone (FLONASE) 50 MCG/ACT nasal spray PLACE 1 SPRAY INTO BOTH NOSTRILS 2 (TWO) TIMES DAILY 48 mL 0   KRILL OIL PO Take 300 mg by mouth daily.     levocetirizine (XYZAL) 5 MG tablet TAKE 1 TABLET BY MOUTH EVERY DAY IN THE EVENING FOR ALLERGIES 90 tablet 0   losartan (COZAAR)  50 MG tablet Take 1 tablet (50 mg total) by mouth daily. 90 tablet 3   metFORMIN (GLUCOPHAGE-XR) 500 MG 24 hr tablet TAKE 1 TABLET BY MOUTH DAILY WITH BREAKFAST FOR DIABETES. 90 tablet 0   Multiple Vitamin (MULTIVITAMIN WITH MINERALS) TABS tablet Take 1 tablet by mouth daily.     nitroGLYCERIN (NITROSTAT) 0.4 MG SL tablet Place 1 tablet (0.4 mg total) under the tongue every 5 (five) minutes as needed for chest pain. 25 tablet 3   pantoprazole (PROTONIX)  40 MG tablet Take 1 tablet (40 mg total) by mouth daily. For heartburn. 90 tablet 0   sildenafil (VIAGRA) 100 MG tablet Take 1 tablet (100 mg total) by mouth daily as needed for erectile dysfunction. 90 tablet 0   No current facility-administered medications on file prior to visit.    BP 136/72   Pulse 62   Temp 97.8 F (36.6 C) (Temporal)   Ht 5\' 8"  (1.727 m)   Wt 200 lb (90.7 kg)   SpO2 99%   BMI 30.41 kg/m  Objective:   Physical Exam HENT:     Right Ear: Tympanic membrane and ear canal normal.     Left Ear: Tympanic membrane and ear canal normal.  Eyes:     Pupils: Pupils are equal, round, and reactive to light.  Cardiovascular:     Rate and Rhythm: Normal rate and regular rhythm.  Pulmonary:     Effort: Pulmonary effort is normal.     Breath sounds: Normal breath sounds.  Abdominal:     General: Bowel sounds are normal.     Palpations: Abdomen is soft.     Tenderness: There is no abdominal tenderness.  Musculoskeletal:        General: Normal range of motion.     Cervical back: Neck supple.  Skin:    General: Skin is warm and dry.  Neurological:     Mental Status: He is alert and oriented to person, place, and time.     Cranial Nerves: No cranial nerve deficit.     Deep Tendon Reflexes:     Reflex Scores:      Patellar reflexes are 2+ on the right side and 2+ on the left side. Psychiatric:        Mood and Affect: Mood normal.           Assessment & Plan:  Type 2 diabetes mellitus with hyperglycemia, without  long-term current use of insulin (HCC) Assessment & Plan: Repeat A1c pending.  Continue metformin ER 500 mg daily. Follow-up in 3 to 6 months based on A1c result.  Orders: -     Hemoglobin A1c  Elevated PSA  Benign essential HTN Assessment & Plan: Controlled.  Continue losartan 50 mg daily, carvedilol 3.125 mg twice daily. CMP pending.  Orders: -     Comprehensive metabolic panel  Mixed hyperlipidemia Assessment & Plan: Repeat lipid panel pending.  Continue atorvastatin 40 mg daily.  Orders: -     Lipid panel  Screening for prostate cancer -     PSA, Medicare  Screening for colon cancer -     Cologuard  CAD S/P percutaneous coronary angioplasty/stents Assessment & Plan: Asymptomatic. Following with cardiology, office notes reviewed from October 2024.  Lipids and A1c pending today.   Gastroesophageal reflux disease, unspecified whether esophagitis present Assessment & Plan: Controlled.  We discussed the potential dose reduction to 20 mg daily, however, he kindly declines and would like to continue at current dose. Continue pantoprazole 40 mg daily.   Caregiver role strain Assessment & Plan: Intolerant to citalopram.  Remain off.         Doreene Nest, NP

## 2023-06-04 NOTE — Assessment & Plan Note (Signed)
Repeat A1c pending.  Continue metformin ER 500 mg daily. Follow-up in 3 to 6 months based on A1c result.

## 2023-06-04 NOTE — Patient Instructions (Signed)
Stop by the lab prior to leaving today. I will notify you of your results once received.   Complete the Cologuard Kit once received.   Please schedule a follow up visit for 6 months for a diabetes check.  It was a pleasure to see you today!

## 2023-06-18 ENCOUNTER — Ambulatory Visit: Payer: Medicare Other | Admitting: Primary Care

## 2023-06-18 ENCOUNTER — Encounter: Payer: Self-pay | Admitting: Primary Care

## 2023-06-18 VITALS — BP 124/78 | HR 64 | Temp 97.2°F | Ht 68.0 in | Wt 199.0 lb

## 2023-06-18 DIAGNOSIS — Z638 Other specified problems related to primary support group: Secondary | ICD-10-CM

## 2023-06-18 NOTE — Progress Notes (Signed)
 Subjective:    Patient ID: Alec Smith, male    DOB: 1956/03/11, 68 y.o.   MRN: [id number]  HPI  Alec Smith is a very pleasant 68 y.o. male with a history of hypertension, CAD, type 2 diabetes, hyperlipidemia, caregiver role stress who presents today for a cognitive test.   This was recommended by his wife as she is concerned about his memory. His wife has been diagnosed with mild cognitive impairment, frontal temporal dementia, Lewy Body dementia, delusional behavior.  She believes that he has a memory impairment for reasons of difficulty remembering names of people from 15 years ago or restaurants they have visited years ago.   He is under caregiver stress as he is the sole caregiver of his wife who continues to experience symptoms of dementia. She accuses him of losing her things, gets angry with all family members and some friends, refuses to get out of the house at times. She gives away her belongings and then forgets she does so. She continues to believe that there are moths/insects in the house despite numerous exterminators confirming that there are not. She is in denial regarding her diagnoses.  He is not concerned about his memory, he is mostly here to appease his wife.  He did well on citalopram  except for side effects of impotence which caused stress for them.  He is not interested in taking a medication at this time.  His wife uses his MyChart portal often to communicate as him.  Review of Systems  Respiratory:  Negative for shortness of breath.   Cardiovascular:  Negative for chest pain.  Neurological:  Negative for dizziness and headaches.  Psychiatric/Behavioral:  The patient is nervous/anxious.        See HPI         Past Medical History:  Diagnosis Date   CAD (coronary artery disease)    GERD (gastroesophageal reflux disease)    Hx of adenomatous polyp of colon 02/09/2015   Hyperlipidemia    Hypertension     Social History   Socioeconomic History    Marital status: Married    Spouse name: Not on file   Number of children: 2   Years of education: Not on file   Highest education level: Master's degree (e.g., MA, MS, MEng, MEd, MSW, MBA)  Occupational History   Occupation: school Sales Promotion Account Executive: [employer]  Tobacco Use   Smoking status: Never   Smokeless tobacco: Never  Vaping Use   Vaping status: Never Used  Substance and Sexual Activity   Alcohol use: Yes    Alcohol/week: 0.0 standard drinks of alcohol    Comment: once or twice a year   Drug use: No   Sexual activity: Not on file  Other Topics Concern   Not on file  Social History Narrative   Married.   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   01/21/2015   Social Drivers of Health   Financial Resource Strain: Medium Risk (06/17/2023)   Overall Financial Resource Strain (CARDIA)    Difficulty of Paying Living Expenses: Somewhat hard  Food Insecurity: No Food Insecurity (06/17/2023)   Hunger Vital Sign    Worried About Running Out of Food in the Last Year: Never true    Ran Out of Food in the Last Year: Never true  Transportation Needs: No Transportation Needs (06/17/2023)   PRAPARE - Transportation  Lack of Transportation (Medical): No    Lack of Transportation (Non-Medical): No  Physical Activity: Unknown (06/17/2023)   Exercise Vital Sign    Days of Exercise per Week: 0 days    Minutes of Exercise per Session: Not on file  Stress: Stress Concern Present (06/17/2023)   [employer] of Occupational Health - Occupational Stress Questionnaire    Feeling of Stress : To some extent  Social Connections: Socially Isolated (06/17/2023)   Social Connection and Isolation Panel [NHANES]    Frequency of Communication with Friends and Family: Never    Frequency of Social Gatherings with Friends and Family: Never    Attends Religious Services: Never    Database Administrator or Organizations: No     Attends Engineer, Structural: Not on file    Marital Status: Married  Catering Manager Violence: Not At Risk (09/04/2021)   Humiliation, Afraid, Rape, and Kick questionnaire    Fear of Current or Ex-Partner: No    Emotionally Abused: No    Physically Abused: No    Sexually Abused: No    Past Surgical History:  Procedure Laterality Date   APPENDECTOMY  1966   CORONARY ANGIOPLASTY WITH STENT PLACEMENT  2010   LUMBAR DISC SURGERY  2000   Lower back   PROSTATE BIOPSY     TONSILLECTOMY     age 22    Family History  Problem Relation Age of Onset   Aneurysm Mother 60       AAA   Heart failure Father    Prostate cancer Paternal Grandfather    Vaginal cancer Paternal Grandmother        spread to lungs   Colon cancer Neg Hx    Esophageal cancer Neg Hx    Stomach cancer Neg Hx    Rectal cancer Neg Hx     No Known Allergies  Current Outpatient Medications on File Prior to Visit  Medication Sig Dispense Refill   atorvastatin  (LIPITOR) 40 MG tablet Take 1 tablet (40 mg total) by mouth daily. 90 tablet 3   carvedilol  (COREG ) 3.125 MG tablet Take 1 tablet (3.125 mg total) by mouth 2 (two) times daily with a meal. 180 tablet 3   clopidogrel  (PLAVIX ) 75 MG tablet Take 1 tablet (75 mg total) by mouth daily. 90 tablet 3   fluticasone  (FLONASE ) 50 MCG/ACT nasal spray PLACE 1 SPRAY INTO BOTH NOSTRILS 2 (TWO) TIMES DAILY 48 mL 0   KRILL OIL PO Take 300 mg by mouth daily.     levocetirizine (XYZAL ) 5 MG tablet TAKE 1 TABLET BY MOUTH EVERY DAY IN THE EVENING FOR ALLERGIES 90 tablet 0   losartan  (COZAAR ) 50 MG tablet Take 1 tablet (50 mg total) by mouth daily. 90 tablet 3   metFORMIN  (GLUCOPHAGE -XR) 500 MG 24 hr tablet TAKE 1 TABLET BY MOUTH DAILY WITH BREAKFAST FOR DIABETES. 90 tablet 0   Multiple Vitamin (MULTIVITAMIN WITH MINERALS) TABS tablet Take 1 tablet by mouth daily.     nitroGLYCERIN  (NITROSTAT ) 0.4 MG SL tablet Place 1 tablet (0.4 mg total) under the tongue every 5 (five)  minutes as needed for chest pain. 25 tablet 3   pantoprazole  (PROTONIX ) 40 MG tablet Take 1 tablet (40 mg total) by mouth daily. For heartburn. 90 tablet 0   sildenafil  (VIAGRA ) 100 MG tablet Take 1 tablet (100 mg total) by mouth daily as needed for erectile dysfunction. 90 tablet 0   No current facility-administered medications on file prior to visit.  BP 124/78   Pulse 64   Temp (!) 97.2 F (36.2 C) (Temporal)   Ht 5' 8 (1.727 m)   Wt 199 lb (90.3 kg)   SpO2 99%   BMI 30.26 kg/m  Objective:   Physical Exam Eyes:     Extraocular Movements: Extraocular movements intact.  Cardiovascular:     Rate and Rhythm: Normal rate and regular rhythm.  Pulmonary:     Effort: Pulmonary effort is normal.     Breath sounds: Normal breath sounds.  Musculoskeletal:     Cervical back: Neck supple.  Skin:    General: Skin is warm and dry.  Neurological:     Mental Status: He is alert and oriented to person, place, and time.     Cranial Nerves: No cranial nerve deficit.     Coordination: Coordination normal.  Psychiatric:        Mood and Affect: Mood normal.           Assessment & Plan:  Caregiver role strain Assessment & Plan: Agree that there is no memory impairment upon exam today. He passed two memory tests in the office today.  Offered to try a different medication treatment for his caregiver strain, he kindly declines for now but will update if he changes his mind.         Maicee Ullman K Marylyn Appenzeller, NP

## 2023-06-18 NOTE — Assessment & Plan Note (Signed)
Agree that there is no memory impairment upon exam today. He passed two memory tests in the office today.  Offered to try a different medication treatment for his caregiver strain, he kindly declines for now but will update if he changes his mind.

## 2023-06-18 NOTE — Patient Instructions (Signed)
Please follow-up in 6 months as discussed.  It was a pleasure to see you today!

## 2023-06-22 LAB — COLOGUARD: COLOGUARD: NEGATIVE

## 2023-06-26 ENCOUNTER — Ambulatory Visit: Payer: Medicare Other | Admitting: Primary Care

## 2023-06-27 ENCOUNTER — Ambulatory Visit: Payer: Medicare Other | Admitting: Family Medicine

## 2023-07-01 ENCOUNTER — Other Ambulatory Visit: Payer: Self-pay | Admitting: Primary Care

## 2023-07-01 DIAGNOSIS — Z638 Other specified problems related to primary support group: Secondary | ICD-10-CM

## 2023-07-03 ENCOUNTER — Ambulatory Visit: Payer: Medicare Other | Admitting: Family Medicine

## 2023-08-16 ENCOUNTER — Other Ambulatory Visit: Payer: Self-pay | Admitting: Primary Care

## 2023-08-16 DIAGNOSIS — E119 Type 2 diabetes mellitus without complications: Secondary | ICD-10-CM

## 2023-08-17 ENCOUNTER — Other Ambulatory Visit: Payer: Self-pay | Admitting: Primary Care

## 2023-08-17 DIAGNOSIS — J302 Other seasonal allergic rhinitis: Secondary | ICD-10-CM

## 2023-08-18 ENCOUNTER — Other Ambulatory Visit: Payer: Self-pay | Admitting: Primary Care

## 2023-08-18 DIAGNOSIS — K219 Gastro-esophageal reflux disease without esophagitis: Secondary | ICD-10-CM

## 2023-09-18 ENCOUNTER — Telehealth: Payer: Self-pay

## 2023-09-18 NOTE — Telephone Encounter (Signed)
 Spoke to pt, r/s appt for 11/13/23

## 2023-09-18 NOTE — Telephone Encounter (Signed)
 Copied from CRM [id number]. Topic: General - Other >> Sep 18, 2023  8:14 AM Annelle Kiel wrote: Reason for CRM: patient is needing a call regarding his wellness visit appt it needs to be rescheduled and the next day is all the way out in [place] he needs to be seen sooner then that a good call back number is     [phone]

## 2023-09-19 ENCOUNTER — Ambulatory Visit: Payer: Medicare Other

## 2023-10-15 ENCOUNTER — Ambulatory Visit: Admitting: Primary Care

## 2023-10-16 ENCOUNTER — Ambulatory Visit: Payer: Self-pay | Admitting: Primary Care

## 2023-10-16 ENCOUNTER — Encounter: Payer: Self-pay | Admitting: Primary Care

## 2023-10-16 ENCOUNTER — Ambulatory Visit (INDEPENDENT_AMBULATORY_CARE_PROVIDER_SITE_OTHER): Admitting: Primary Care

## 2023-10-16 ENCOUNTER — Other Ambulatory Visit: Payer: Self-pay | Admitting: Primary Care

## 2023-10-16 VITALS — BP 110/62 | HR 56 | Temp 97.0°F | Ht 68.0 in | Wt 186.0 lb

## 2023-10-16 DIAGNOSIS — Z7984 Long term (current) use of oral hypoglycemic drugs: Secondary | ICD-10-CM

## 2023-10-16 DIAGNOSIS — E1165 Type 2 diabetes mellitus with hyperglycemia: Secondary | ICD-10-CM

## 2023-10-16 LAB — MICROALBUMIN / CREATININE URINE RATIO
Creatinine,U: 402.8 mg/dL
Microalb Creat Ratio: 3.4 mg/g (ref 0.0–30.0)
Microalb, Ur: 1.4 mg/dL (ref 0.0–1.9)

## 2023-10-16 LAB — POCT GLYCOSYLATED HEMOGLOBIN (HGB A1C): Hemoglobin A1C: 5.9 % — AB (ref 4.0–5.6)

## 2023-10-16 MED ORDER — BLOOD GLUCOSE MONITORING SUPPL DEVI
1.0000 | Freq: Three times a day (TID) | 0 refills | Status: DC
Start: 2023-10-16 — End: 2023-10-16

## 2023-10-16 MED ORDER — LANCET DEVICE MISC: 1.0000 | Freq: Three times a day (TID) | 0 refills | Status: AC

## 2023-10-16 MED ORDER — LANCETS MISC. MISC: 1.0000 | Freq: Three times a day (TID) | 5 refills | Status: DC

## 2023-10-16 MED ORDER — BLOOD GLUCOSE TEST VI STRP: 1.0000 | ORAL_STRIP | Freq: Three times a day (TID) | 5 refills | Status: DC

## 2023-10-16 NOTE — Patient Instructions (Signed)
 Stop by the lab prior to leaving today. I will notify you of your results once received.   Schedule your annual follow-up visit with me for 6 months.  It was a pleasure to see you today!

## 2023-10-16 NOTE — Progress Notes (Signed)
 Subjective:    Patient ID: Alec Smith, male    DOB: [birth year], 68 y.o.   MRN: [id number]  HPI  Alec Smith is a very pleasant 68 y.o. male with a history of type 2 diabetes, tension, CAD who presents today for follow-up of diabetes.  Current medications include: Metformin  ER 500 mg daily  He is checking his blood glucose 0 times daily. He does not have a glucometer.  Last A1C: 6.4 in January 2025 Last Eye Exam: Up-to-date Last Foot Exam: Up-to-date Pneumonia Vaccination: 2020 Urine Microalbumin: Due Statin: Atorvastatin   Dietary changes since last visit: Mostly drinking water, occasional sweet tea.    Exercise: Walking  BP Readings from Last 3 Encounters:  10/16/23 110/62  06/18/23 124/78  06/04/23 136/72    Wt Readings from Last 3 Encounters:  10/16/23 186 lb (84.4 kg)  06/18/23 199 lb (90.3 kg)  06/04/23 200 lb (90.7 kg)       Review of Systems  Eyes:  Negative for visual disturbance.  Cardiovascular:  Negative for chest pain.  Endocrine: Negative for polydipsia, polyphagia and polyuria.  Neurological:  Negative for numbness.         Past Medical History:  Diagnosis Date   CAD (coronary artery disease)    GERD (gastroesophageal reflux disease)    Hx of adenomatous polyp of colon 02/09/2015   Hyperlipidemia    Hypertension     Social History   Socioeconomic History   Marital status: Married    Spouse name: Not on file   Number of children: 2   Years of education: Not on file   Highest education level: Master's degree (e.g., MA, MS, MEng, MEd, MSW, MBA)  Occupational History   Occupation: school Sales promotion account executive: [employer]  Tobacco Use   Smoking status: Never   Smokeless tobacco: Never  Vaping Use   Vaping status: Never Used  Substance and Sexual Activity   Alcohol use: Yes    Alcohol/week: 0.0 standard drinks of alcohol    Comment: once or twice a year   Drug use: No   Sexual activity: Not on file  Other Topics  Concern   Not on file  Social History Narrative   Married.   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   01/21/2015   Social Drivers of Health   Financial Resource Strain: Medium Risk (06/17/2023)   Overall Financial Resource Strain (CARDIA)    Difficulty of Paying Living Expenses: Somewhat hard  Food Insecurity: No Food Insecurity (06/17/2023)   Hunger Vital Sign    Worried About Running Out of Food in the Last Year: Never true    Ran Out of Food in the Last Year: Never true  Transportation Needs: No Transportation Needs (06/17/2023)   PRAPARE - Administrator, Civil Service (Medical): No    Lack of Transportation (Non-Medical): No  Physical Activity: Unknown (06/17/2023)   Exercise Vital Sign    Days of Exercise per Week: 0 days    Minutes of Exercise per Session: Not on file  Stress: Stress Concern Present (06/17/2023)   [employer] of Occupational Health - Occupational Stress Questionnaire    Feeling of Stress : To some extent  Social Connections: Socially Isolated (06/17/2023)   Social Connection and Isolation Panel [NHANES]    Frequency of Communication with Friends and Family: Never    Frequency of Social Gatherings  with Friends and Family: Never    Attends Religious Services: Never    Database administrator or Organizations: No    Attends Engineer, structural: Not on file    Marital Status: Married  Catering manager Violence: Not At Risk (09/04/2021)   Humiliation, Afraid, Rape, and Kick questionnaire    Fear of Current or Ex-Partner: No    Emotionally Abused: No    Physically Abused: No    Sexually Abused: No    Past Surgical History:  Procedure Laterality Date   APPENDECTOMY  1966   CORONARY ANGIOPLASTY WITH STENT PLACEMENT  2010   LUMBAR DISC SURGERY  2000   Lower back   PROSTATE BIOPSY     TONSILLECTOMY     age 29    Family History  Problem Relation Age of Onset   Aneurysm Mother  31       AAA   Heart failure Father    Prostate cancer Paternal Grandfather    Vaginal cancer Paternal Grandmother        spread to lungs   Colon cancer Neg Hx    Esophageal cancer Neg Hx    Stomach cancer Neg Hx    Rectal cancer Neg Hx     No Known Allergies  Current Outpatient Medications on File Prior to Visit  Medication Sig Dispense Refill   atorvastatin  (LIPITOR) 40 MG tablet Take 1 tablet (40 mg total) by mouth daily. 90 tablet 3   carvedilol  (COREG ) 3.125 MG tablet Take 1 tablet (3.125 mg total) by mouth 2 (two) times daily with a meal. 180 tablet 3   clopidogrel  (PLAVIX ) 75 MG tablet Take 1 tablet (75 mg total) by mouth daily. 90 tablet 3   fluticasone  (FLONASE ) 50 MCG/ACT nasal spray PLACE 1 SPRAY INTO BOTH NOSTRILS 2 (TWO) TIMES DAILY 48 mL 0   KRILL OIL PO Take 300 mg by mouth daily.     levocetirizine (XYZAL ) 5 MG tablet TAKE 1 TABLET BY MOUTH EVERY DAY IN THE EVENING FOR ALLERGIES 90 tablet 2   losartan  (COZAAR ) 50 MG tablet Take 1 tablet (50 mg total) by mouth daily. 90 tablet 3   metFORMIN  (GLUCOPHAGE -XR) 500 MG 24 hr tablet TAKE 1 TABLET BY MOUTH DAILY WITH BREAKFAST FOR DIABETES. 90 tablet 0   Multiple Vitamin (MULTIVITAMIN WITH MINERALS) TABS tablet Take 1 tablet by mouth daily.     nitroGLYCERIN  (NITROSTAT ) 0.4 MG SL tablet Place 1 tablet (0.4 mg total) under the tongue every 5 (five) minutes as needed for chest pain. 25 tablet 3   pantoprazole  (PROTONIX ) 40 MG tablet TAKE 1 TABLET (40 MG TOTAL) BY MOUTH DAILY. FOR HEARTBURN. 90 tablet 2   sildenafil  (VIAGRA ) 100 MG tablet Take 1 tablet (100 mg total) by mouth daily as needed for erectile dysfunction. 90 tablet 0   No current facility-administered medications on file prior to visit.    BP 110/62   Pulse (!) 56   Temp (!) 97 F (36.1 C) (Temporal)   Ht 5\' 8"  (1.727 m)   Wt 186 lb (84.4 kg)   SpO2 99%   BMI 28.28 kg/m  Objective:   Physical Exam Cardiovascular:     Rate and Rhythm: Normal rate and regular  rhythm.  Pulmonary:     Effort: Pulmonary effort is normal.     Breath sounds: Normal breath sounds.  Musculoskeletal:     Cervical back: Neck supple.  Skin:    General: Skin is warm and dry.  Neurological:  Mental Status: He is alert and oriented to person, place, and time.  Psychiatric:        Mood and Affect: Mood normal.           Assessment & Plan:  Type 2 diabetes mellitus with hyperglycemia, without long-term current use of insulin (HCC) Assessment & Plan: Improved and well-controlled with A1c of 5.9 today  Commended him on increased physical activity and changes in his diet.  Continue metformin  ER 500 mg daily. Follow-up in 6 months.  Orders: -     POCT glycosylated hemoglobin (Hb A1C) -     Microalbumin / creatinine urine ratio -     Blood Glucose Monitoring Suppl; 1 each by Does not apply route in the morning, at noon, and at bedtime. May substitute to any manufacturer covered by patient's insurance.  Dispense: 1 each; Refill: 0 -     Blood Glucose Test; 1 each by In Vitro route in the morning, at noon, and at bedtime. May substitute to any manufacturer covered by patient's insurance.  Dispense: 100 strip; Refill: 5 -     Lancet Device; 1 each by Does not apply route in the morning, at noon, and at bedtime. May substitute to any manufacturer covered by patient's insurance.  Dispense: 1 each; Refill: 0 -     Lancets Misc.; 1 each by Does not apply route in the morning, at noon, and at bedtime. May substitute to any manufacturer covered by patient's insurance.  Dispense: 100 each; Refill: 5        Gabriel John, NP

## 2023-10-16 NOTE — Assessment & Plan Note (Signed)
 Improved and well-controlled with A1c of 5.9 today  Commended him on increased physical activity and changes in his diet.  Continue metformin  ER 500 mg daily. Follow-up in 6 months.

## 2023-11-13 ENCOUNTER — Ambulatory Visit

## 2023-11-17 ENCOUNTER — Other Ambulatory Visit: Payer: Self-pay | Admitting: Urology

## 2023-11-17 ENCOUNTER — Other Ambulatory Visit: Payer: Self-pay | Admitting: Primary Care

## 2023-11-17 DIAGNOSIS — E119 Type 2 diabetes mellitus without complications: Secondary | ICD-10-CM

## 2023-11-27 LAB — HM DIABETES EYE EXAM

## 2023-12-04 ENCOUNTER — Ambulatory Visit: Payer: Medicare Other | Admitting: Primary Care

## 2024-01-03 ENCOUNTER — Other Ambulatory Visit: Payer: Self-pay | Admitting: Primary Care

## 2024-01-03 DIAGNOSIS — E1165 Type 2 diabetes mellitus with hyperglycemia: Secondary | ICD-10-CM

## 2024-01-17 ENCOUNTER — Ambulatory Visit

## 2024-01-17 VITALS — BP 102/60 | Ht 68.0 in | Wt 181.2 lb

## 2024-01-17 DIAGNOSIS — Z0000 Encounter for general adult medical examination without abnormal findings: Secondary | ICD-10-CM

## 2024-01-17 DIAGNOSIS — Z23 Encounter for immunization: Secondary | ICD-10-CM

## 2024-01-17 NOTE — Progress Notes (Signed)
 Subjective:   Alec Smith is a 68 y.o. who presents for a Medicare Wellness preventive visit.  As a reminder, Annual Wellness Visits don't include a physical exam, and some assessments may be limited, especially if this visit is performed virtually. We may recommend an in-person follow-up visit with your provider if needed.  Visit Complete: In person  Persons Participating in Visit: Patient.  AWV Questionnaire: No: Patient Medicare AWV questionnaire was not completed prior to this visit.  Cardiac Risk Factors include: advanced age (>26men, >93 women);diabetes mellitus;dyslipidemia;hypertension;male gender;sedentary lifestyle     Objective:    Today's Vitals   01/17/24 1541  Weight: 181 lb 3.2 oz (82.2 kg)  Height: 5' 8 (1.727 m)   Body mass index is 27.55 kg/m.     01/17/2024    4:10 PM 09/04/2021   12:43 PM 02/25/2015    3:38 PM 02/02/2015   10:01 AM  Advanced Directives  Does Patient Have a Medical Advance Directive? Yes No No  No   Type of Estate agent of [place];Living will     Copy of Healthcare Power of Attorney in Chart? No - copy requested     Would patient like information on creating a medical advance directive?  No - Patient declined No - patient declined information       Data saved with a previous flowsheet row definition    Current Medications (verified) Outpatient Encounter Medications as of 01/17/2024  Medication Sig   Accu-Chek Softclix Lancets lancets Check blood sugar once daily   atorvastatin  (LIPITOR) 40 MG tablet Take 1 tablet (40 mg total) by mouth daily.   Blood Glucose Monitoring Suppl (ACCU-CHEK GUIDE ME) w/Device KIT Test blood sugar once daily.   carvedilol  (COREG ) 3.125 MG tablet Take 1 tablet (3.125 mg total) by mouth 2 (two) times daily with a meal.   clopidogrel  (PLAVIX ) 75 MG tablet Take 1 tablet (75 mg total) by mouth daily.   fluticasone  (FLONASE ) 50 MCG/ACT nasal spray PLACE 1 SPRAY INTO BOTH NOSTRILS 2 (TWO)  TIMES DAILY   glucose blood (ACCU-CHEK GUIDE TEST) test strip Test blood sugar once daily   KRILL OIL PO Take 300 mg by mouth daily.   levocetirizine (XYZAL ) 5 MG tablet TAKE 1 TABLET BY MOUTH EVERY DAY IN THE EVENING FOR ALLERGIES   losartan  (COZAAR ) 50 MG tablet Take 1 tablet (50 mg total) by mouth daily.   metFORMIN  (GLUCOPHAGE -XR) 500 MG 24 hr tablet TAKE 1 TABLET BY MOUTH DAILY WITH BREAKFAST FOR DIABETES.   Multiple Vitamin (MULTIVITAMIN WITH MINERALS) TABS tablet Take 1 tablet by mouth daily.   nitroGLYCERIN  (NITROSTAT ) 0.4 MG SL tablet Place 1 tablet (0.4 mg total) under the tongue every 5 (five) minutes as needed for chest pain.   pantoprazole  (PROTONIX ) 40 MG tablet TAKE 1 TABLET (40 MG TOTAL) BY MOUTH DAILY. FOR HEARTBURN.   sildenafil  (VIAGRA ) 100 MG tablet TAKE 1 TABLET BY MOUTH EVERY DAY AS NEEDED FOR ERECTILE DYSFUNCTION   No facility-administered encounter medications on file as of 01/17/2024.    Allergies (verified) Patient has no known allergies.   History: Past Medical History:  Diagnosis Date   CAD (coronary artery disease)    GERD (gastroesophageal reflux disease)    Hx of adenomatous polyp of colon 02/09/2015   Hyperlipidemia    Hypertension    Past Surgical History:  Procedure Laterality Date   APPENDECTOMY  1966   CORONARY ANGIOPLASTY WITH STENT PLACEMENT  2010   LUMBAR DISC SURGERY  2000   Lower back   PROSTATE BIOPSY     TONSILLECTOMY     age 17   Family History  Problem Relation Age of Onset   Aneurysm Mother 47       AAA   Heart failure Father    Prostate cancer Paternal Grandfather    Vaginal cancer Paternal Grandmother        spread to lungs   Colon cancer Neg Hx    Esophageal cancer Neg Hx    Stomach cancer Neg Hx    Rectal cancer Neg Hx    Social History   Socioeconomic History   Marital status: Married    Spouse name: Not on file   Number of children: 2   Years of education: Not on file   Highest education level: Master's degree  (e.g., MA, MS, MEng, MEd, MSW, MBA)  Occupational History   Occupation: school Sales promotion account executive: [employer]  Tobacco Use   Smoking status: Never   Smokeless tobacco: Never  Vaping Use   Vaping status: Never Used  Substance and Sexual Activity   Alcohol use: Yes    Alcohol/week: 0.0 standard drinks of alcohol    Comment: once or twice a year   Drug use: No   Sexual activity: Not on file  Other Topics Concern   Not on file  Social History Narrative   Married.   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   01/21/2015   Social Drivers of Health   Financial Resource Strain: Medium Risk (01/17/2024)   Overall Financial Resource Strain (CARDIA)    Difficulty of Paying Living Expenses: Somewhat hard  Food Insecurity: No Food Insecurity (01/17/2024)   Hunger Vital Sign    Worried About Running Out of Food in the Last Year: Never true    Ran Out of Food in the Last Year: Never true  Transportation Needs: No Transportation Needs (01/17/2024)   PRAPARE - Administrator, Civil Service (Medical): No    Lack of Transportation (Non-Medical): No  Physical Activity: Inactive (01/17/2024)   Exercise Vital Sign    Days of Exercise per Week: 0 days    Minutes of Exercise per Session: 0 min  Stress: No Stress Concern Present (01/17/2024)   [employer] of Occupational Health - Occupational Stress Questionnaire    Feeling of Stress: Only a little  Social Connections: Socially Isolated (01/17/2024)   Social Connection and Isolation Panel    Frequency of Communication with Friends and Family: Never    Frequency of Social Gatherings with Friends and Family: Never    Attends Religious Services: Never    Database administrator or Organizations: No    Attends Engineer, structural: Never    Marital Status: Married    Tobacco Counseling Counseling given: Not Answered   Clinical Intake:  Pre-visit  preparation completed: Yes  Pain : No/denies pain    BMI - recorded: 27.55 Nutritional Status: BMI 25 -29 Overweight Nutritional Risks: None Diabetes: Yes CBG done?: No Did pt. bring in CBG monitor from home?: No  Lab Results  Component Value Date   HGBA1C 5.9 (A) 10/16/2023   HGBA1C 6.4 06/04/2023   HGBA1C 6.1 (A) 11/08/2022     How often do you need to have someone help you when you read instructions, pamphlets, or other written materials from your doctor or pharmacy?: 1 -  Never  Interpreter Needed?: No  Comments: lives with wife Information entered by :: B.Van Ehlert,LPN   Activities of Daily Living     01/17/2024    4:11 PM  In your present state of health, do you have any difficulty performing the following activities:  Hearing? 0  Vision? 0  Difficulty concentrating or making decisions? 0  Walking or climbing stairs? 0  Dressing or bathing? 0  Doing errands, shopping? 0  Preparing Food and eating ? N  Using the Toilet? N  In the past six months, have you accidently leaked urine? N  Do you have problems with loss of bowel control? N  Managing your Medications? N  Managing your Finances? N  Housekeeping or managing your Housekeeping? N    Patient Care Team: Gretta Comer POUR, NP as PCP - General (Nurse Practitioner) Perla Evalene PARAS, MD as Consulting Physician (Cardiology) Laurice Francis NOVAK, OD (Optometry) Twylla, Glendia BROCKS, MD (Urology)  I have updated your Care Teams any recent Medical Services you may have received from other providers in the past year.     Assessment:   This is a routine wellness examination for [employer].  Hearing/Vision screen Hearing Screening - Comments:: Patient denies any hearing difficulties.   Vision Screening - Comments:: Pt says their vision is good with glasses Dr  Clementina last month visit   Goals Addressed               This Visit's Progress     Increase physical activity (pt-stated)   Not on track     01/17/24        Depression Screen     01/17/2024    3:53 PM 10/16/2023    9:00 AM 06/18/2023    8:48 AM 11/08/2022    9:29 AM 09/04/2021   12:40 PM 04/18/2021    9:06 AM 02/25/2020    7:41 AM  PHQ 2/9 Scores  PHQ - 2 Score 0 0 0 0 0 0 0  PHQ- 9 Score      0 0    Fall Risk     01/17/2024    3:50 PM 10/16/2023    9:00 AM 06/18/2023    8:48 AM 06/04/2023   11:00 AM 04/09/2023    3:12 PM  Fall Risk   Falls in the past year? 0 0 0 0 0  Number falls in past yr: 0 0 0 0 0  Injury with Fall? 0 0 0 0 0  Risk for fall due to : No Fall Risks No Fall Risks No Fall Risks No Fall Risks No Fall Risks  Follow up Falls prevention discussed;Education provided Falls evaluation completed Falls evaluation completed Falls evaluation completed Falls evaluation completed    MEDICARE RISK AT HOME:  Medicare Risk at Home Any stairs in or around the home?: Yes If so, are there any without handrails?: Yes Home free of loose throw rugs in walkways, pet beds, electrical cords, etc?: Yes Adequate lighting in your home to reduce risk of falls?: Yes Life alert?: No Use of a cane, walker or w/c?: No Grab bars in the bathroom?: Yes Shower chair or bench in shower?: Yes Elevated toilet seat or a handicapped toilet?: No  TIMED UP AND GO:  Was the test performed?  Yes  Length of time to ambulate 10 feet: 10 sec Gait steady and fast without use of assistive device  Cognitive Function: 6CIT completed    06/18/2023    9:25 AM  MMSE - Mini Mental  State Exam  Orientation to time 5  Orientation to Place 5  Registration 3  Attention/ Calculation 5  Recall 2  Language- name 2 objects 2  Language- repeat 1  Language- follow 3 step command 3  Language- read & follow direction 1  Write a sentence 1  Copy design 1  Total score 29        01/17/2024    4:23 PM 06/18/2023    8:48 AM 09/04/2021   12:44 PM  6CIT Screen  What Year? 0 points 0 points 0 points  What month? 0 points 0 points 0 points  What time? 0 points 0 points 0  points  Count back from 20 0 points 0 points 0 points  Months in reverse 0 points 0 points 0 points  Repeat phrase 0 points 0 points 0 points  Total Score 0 points 0 points 0 points    Immunizations Immunization History  Administered Date(s) Administered   Fluad Quad(high Dose 65+) 02/09/2020, 02/03/2022   INFLUENZA, HIGH DOSE SEASONAL PF 02/23/2023, 01/17/2024   Influenza,inj,Quad PF,6+ Mos 03/01/2015, 03/02/2016, 02/25/2018, 02/23/2019   Influenza-Unspecified 02/10/2021   PFIZER Comirnaty(Gray Top)Covid-19 Tri-Sucrose Vaccine 08/15/2020, 02/03/2022   PFIZER(Purple Top)SARS-COV-2 Vaccination 07/11/2019, 08/01/2019, 02/12/2020, 08/15/2020   PNEUMOCOCCAL CONJUGATE-20 02/23/2021   Pfizer Covid-19 Vaccine Bivalent Booster 62yrs & up 02/10/2021, 09/15/2021   Pneumococcal Conjugate-13 02/25/2020   Pneumococcal Polysaccharide-23 11/05/2014   Respiratory Syncytial Virus Vaccine,Recomb Aduvanted(Arexvy) 04/14/2022   Td 11/29/2017   Zoster Recombinant(Shingrix) 12/21/2018, 03/08/2019   Zoster, Live 05/14/2014    Screening Tests Health Maintenance  Topic Date Due   Colonoscopy  02/01/2022   FOOT EXAM  11/08/2023   COVID-19 Vaccine (9 - 2025-26 season) 01/13/2024   HEMOGLOBIN A1C  04/16/2024   Diabetic kidney evaluation - eGFR measurement  06/03/2024   Diabetic kidney evaluation - Urine ACR  10/15/2024   OPHTHALMOLOGY EXAM  11/26/2024   Medicare Annual Wellness (AWV)  01/16/2025   DTaP/Tdap/Td (2 - Tdap) 11/30/2027   Pneumococcal Vaccine: 50+ Years  Completed   Influenza Vaccine  Completed   Hepatitis C Screening  Completed   Zoster Vaccines- Shingrix  Completed   HPV VACCINES  Aged Out   Meningococcal B Vaccine  Aged Out    Health Maintenance Items Addressed: Influenza vaccine given;will get newest Covid vaccine when available Pt says Cologard done last year  Additional Screening:  Vision Screening: Recommended annual ophthalmology exams for early detection of glaucoma and  other disorders of the eye. Is the patient up to date with their annual eye exam?  No  Who is the provider or what is the name of the office in which the patient attends annual eye exams? Dr Laurice  Dental Screening: Recommended annual dental exams for proper oral hygiene  Community Resource Referral / Chronic Care Management: CRR required this visit?  No   CCM required this visit?  No   Plan:    I have personally reviewed and noted the following in the patient's chart:   Medical and social history Use of alcohol, tobacco or illicit drugs  Current medications and supplements including opioid prescriptions. Patient is not currently taking opioid prescriptions. Functional ability and status Nutritional status Physical activity Advanced directives List of other physicians Hospitalizations, surgeries, and ER visits in previous 12 months Vitals Screenings to include cognitive, depression, and falls Referrals and appointments  In addition, I have reviewed and discussed with patient certain preventive protocols, quality metrics, and best practice recommendations. A written personalized care plan for  preventive services as well as general preventive health recommendations were provided to patient.   Erminio LITTIE Saris, LPN   [id number]   After Visit Summary: pt declines:will see in Mychart  Notes: Nothing significant to report at this time.

## 2024-01-17 NOTE — Patient Instructions (Signed)
 [name],  Thank you for taking the time for your Medicare Wellness Visit. I appreciate your continued commitment to your health goals. Please review the care plan we discussed, and feel free to reach out if I can assist you further.  Medicare recommends these wellness visits once per year to help you and your care team stay ahead of potential health issues. These visits are designed to focus on prevention, allowing your provider to concentrate on managing your acute and chronic conditions during your regular appointments.  Please note that Annual Wellness Visits do not include a physical exam. Some assessments may be limited, especially if the visit was conducted virtually. If needed, we may recommend a separate in-person follow-up with your provider.  Ongoing Care Seeing your primary care provider every 3 to 6 months helps us  monitor your health and provide consistent, personalized care.   Referrals If a referral was made during today's visit and you haven't received any updates within two weeks, please contact the referred provider directly to check on the status.  Recommended Screenings:  Health Maintenance  Topic Date Due   Colon Cancer Screening  02/01/2022   Medicare Annual Wellness Visit  09/05/2022   Complete foot exam   11/08/2023   Flu Shot  12/13/2023   COVID-19 Vaccine (9 - 2025-26 season) 01/13/2024   Hemoglobin A1C  04/16/2024   Yearly kidney function blood test for diabetes  06/03/2024   Yearly kidney health urinalysis for diabetes  10/15/2024   Eye exam for diabetics  11/26/2024   DTaP/Tdap/Td vaccine (2 - Tdap) 11/30/2027   Pneumococcal Vaccine for age over 2  Completed   Hepatitis C Screening  Completed   Zoster (Shingles) Vaccine  Completed   HPV Vaccine  Aged Out   Meningitis B Vaccine  Aged Out       09/04/2021   12:43 PM  Advanced Directives  Does Patient Have a Medical Advance Directive? No  Would patient like information on creating a medical  advance directive? No - Patient declined   Advance Care Planning is important because it: Ensures you receive medical care that aligns with your values, goals, and preferences. Provides guidance to your family and loved ones, reducing the emotional burden of decision-making during critical moments.  Vision: Annual vision screenings are recommended for early detection of glaucoma, cataracts, and diabetic retinopathy. These exams can also reveal signs of chronic conditions such as diabetes and high blood pressure.  Dental: Annual dental screenings help detect early signs of oral cancer, gum disease, and other conditions linked to overall health, including heart disease and diabetes.

## 2024-01-29 ENCOUNTER — Other Ambulatory Visit: Payer: Self-pay | Admitting: Cardiovascular Disease

## 2024-02-13 ENCOUNTER — Other Ambulatory Visit: Payer: Self-pay | Admitting: Urology

## 2024-03-21 ENCOUNTER — Other Ambulatory Visit: Payer: Self-pay | Admitting: Cardiovascular Disease

## 2024-03-25 ENCOUNTER — Other Ambulatory Visit: Payer: Self-pay | Admitting: Cardiovascular Disease

## 2024-03-26 ENCOUNTER — Telehealth: Payer: Self-pay | Admitting: Cardiovascular Disease

## 2024-03-26 MED ORDER — ATORVASTATIN CALCIUM 40 MG PO TABS: 40.0000 mg | ORAL_TABLET | Freq: Every day | ORAL | 0 refills | Status: DC

## 2024-03-26 NOTE — Telephone Encounter (Signed)
*  STAT* If patient is at the pharmacy, call can be transferred to refill team.   1. Which medications need to be refilled? (please list name of each medication and dose if known) atorvastatin  (LIPITOR) 40 MG tablet    2. Would you like to learn more about the convenience, safety, & potential cost savings by using the [facility]? no   3. Are you open to using the [facility] (Type [facility] no   4. Which pharmacy/location (including street and city if local pharmacy) is medication to be sent to? [id number] [address], [place] [phone]     5. Do they need a 30 day or 90 day supply? 90 day    Pt is out of Medication and needs a new RX

## 2024-03-26 NOTE — Telephone Encounter (Signed)
 Pt's medication was sent to pt's pharmacy as requested. Confirmation received.

## 2024-03-29 NOTE — Progress Notes (Unsigned)
 Patient ID: Alec Smith, male   DOB: [birth year], 68 y.o.   MRN: [id number] Cardiology Office Note  Date:  03/30/2024   ID:  Alec Smith, Alec Smith Jun 23, 1955, MRN [id number]  PCP:  Gretta Comer POUR, NP   Chief Complaint  Patient presents with   12 month follow up     Doing well.     HPI:  Alec Smith is a pleasant 68 year old gentleman with history of  stress test October 21, 2008 showed anterior ischemia  Cath showing coronary artery disease,  stent placed to the proximal - mid LAD for occluded vessel,  residual RCA disease estimated at 60% with intervention performed in 2010.  He presents for follow-up of his coronary artery disease  Last seen by myself in clinic 10/24  In follow-up today reports doing well Has started working 3 days a week at [employer] Has a new dog Bought a new house, likes to work in the garden Active with no regular exercise program  Continued stress with wife, diagnosed with lewy body dementia History of paranoia, delusion, parkinsons She is followed by [facility] neurology  Labs: Hemoglobin A1c 5.9 Total cholesterol 138 LDL 63 Creatinine 1.18, BUN 20  EKG personally reviewed by myself on todays visit EKG Interpretation Date/Time:  Monday March 30 2024 15:00:35 EST Ventricular Rate:  65 PR Interval:  222 QRS Duration:  92 QT Interval:  398 QTC Calculation: 413 R Axis:   -36  Text Interpretation: Sinus rhythm with 1st degree A-V block Left axis deviation When compared with ECG of 22-Feb-2023 15:29, No significant change was found Confirmed by Perla Lye [phone]) on 03/30/2024 3:28:13 PM   Cath report:  2010 a 1.5 x 10 apex crosswire  was used to ultimately gain access into the distal vessel.  This was used to then dilate the chronically occluded segment.  A 2.0 x 30 apex was then used to further dilate the segment from the point of distal occlusion up to the point of proximal occlusion.  Overlapping 2.25 x 32 Taxus Adam drug-eluting  stents were then deployed beginning distally and then working proximally.  This was followed by 2.25 x 16 Taxus Adam deployed at 16 and 18 atmospheres.  A 3.0 and 12 Taxus was then deployed in the proximal LAD  that had 90% stenosis and the overlapping segment was stented with a 2.75 x 8 Taxus.  The entire 2.25 segment was then postdilated with a 2.5 x 25 [place] Voyager up to 16 and 18 atmospheres, the more proximal segment was postdilated with a 2.75 x a 15 [place] Voyager and the most proximal segment where the 3.0 stent was deployed was postdilated with a 3.25 x 12 [place] Voyager.  PMH:   has a past medical history of CAD (coronary artery disease), GERD (gastroesophageal reflux disease), adenomatous polyp of colon (02/09/2015), Hyperlipidemia, and Hypertension.  PSH:    Past Surgical History:  Procedure Laterality Date   APPENDECTOMY  1966   CORONARY ANGIOPLASTY WITH STENT PLACEMENT  2010   LUMBAR DISC SURGERY  2000   Lower back   PROSTATE BIOPSY     TONSILLECTOMY     age 72    Current Outpatient Medications  Medication Sig Dispense Refill   Accu-Chek Softclix Lancets lancets Check blood sugar once daily 100 each 5   atorvastatin  (LIPITOR) 40 MG tablet Take 1 tablet (40 mg total) by mouth daily. 90 tablet 0   Blood Glucose Monitoring Suppl (ACCU-CHEK GUIDE ME) w/Device KIT Test blood sugar  once daily. 1 kit 0   carvedilol  (COREG ) 3.125 MG tablet Take 1 tablet (3.125 mg total) by mouth 2 (two) times daily with a meal. 180 tablet 3   clopidogrel  (PLAVIX ) 75 MG tablet TAKE 1 TABLET BY MOUTH EVERY DAY 90 tablet 0   fluticasone  (FLONASE ) 50 MCG/ACT nasal spray PLACE 1 SPRAY INTO BOTH NOSTRILS 2 (TWO) TIMES DAILY 48 mL 0   glucose blood (ACCU-CHEK GUIDE TEST) test strip Test blood sugar once daily 100 strip 5   KRILL OIL PO Take 300 mg by mouth daily.     levocetirizine (XYZAL ) 5 MG tablet TAKE 1 TABLET BY MOUTH EVERY DAY IN THE EVENING FOR ALLERGIES 90 tablet 2   losartan  (COZAAR ) 50 MG tablet Take 1  tablet (50 mg total) by mouth daily. 90 tablet 3   metFORMIN  (GLUCOPHAGE -XR) 500 MG 24 hr tablet TAKE 1 TABLET BY MOUTH DAILY WITH BREAKFAST FOR DIABETES. 90 tablet 1   Multiple Vitamin (MULTIVITAMIN WITH MINERALS) TABS tablet Take 1 tablet by mouth daily.     nitroGLYCERIN  (NITROSTAT ) 0.4 MG SL tablet Place 1 tablet (0.4 mg total) under the tongue every 5 (five) minutes as needed for chest pain. 25 tablet 3   pantoprazole  (PROTONIX ) 40 MG tablet TAKE 1 TABLET (40 MG TOTAL) BY MOUTH DAILY. FOR HEARTBURN. 90 tablet 2   sildenafil  (VIAGRA ) 100 MG tablet TAKE 1 TABLET BY MOUTH EVERY DAY AS NEEDED FOR ERECTILE DYSFUNCTION 90 tablet 1   No current facility-administered medications for this visit.    Allergies:   Patient has no known allergies.   Social History:  The patient  reports that he has never smoked. He has never used smokeless tobacco. He reports current alcohol use. He reports that he does not use drugs.   Family History:   family history includes Aneurysm (age of onset: 91) in his mother; Heart failure in his father; Prostate cancer in his paternal grandfather; Vaginal cancer in his paternal grandmother.   Review of Systems: Review of Systems  Constitutional: Negative.   Respiratory: Negative.    Cardiovascular: Negative.   Gastrointestinal: Negative.   Musculoskeletal: Negative.   Neurological: Negative.   Psychiatric/Behavioral: Negative.    All other systems reviewed and are negative.  PHYSICAL EXAM: VS:  BP 110/62 (BP Location: Left Arm, Patient Position: Sitting, Cuff Size: Normal)   Pulse 65   Ht 5' 8 (1.727 m)   Wt 176 lb 8 oz (80.1 kg)   SpO2 99%   BMI 26.84 kg/m  , BMI Body mass index is 26.84 kg/m. Constitutional:  oriented to person, place, and time. No distress.  HENT:  Head: Normocephalic and atraumatic.  Eyes:  no discharge. No scleral icterus.  Neck: Normal range of motion. Neck supple. No JVD present.  Cardiovascular: Normal rate, regular rhythm, normal  heart sounds and intact distal pulses. Exam reveals no gallop and no friction rub. No edema No murmur heard. Pulmonary/Chest: Effort normal and breath sounds normal. No stridor. No respiratory distress.  no wheezes.  no rales.  no tenderness.  Abdominal: Soft.  no distension.  no tenderness.  Musculoskeletal: Normal range of motion.  no  tenderness or deformity.  Neurological:  normal muscle tone. Coordination normal. No atrophy Skin: Skin is warm and dry. No rash noted. not diaphoretic.  Psychiatric:  normal mood and affect. behavior is normal. Thought content normal.    Recent Labs: 06/04/2023: ALT 21; BUN 21; Creatinine, Ser 1.12; Potassium 4.5; Sodium 139    Lipid Panel  Lab Results  Component Value Date   CHOL 138 06/04/2023   HDL 36.90 (L) 06/04/2023   LDLCALC 63 06/04/2023   TRIG 188.0 (H) 06/04/2023    Wt Readings from Last 3 Encounters:  03/30/24 176 lb 8 oz (80.1 kg)  01/17/24 181 lb 3.2 oz (82.2 kg)  10/16/23 186 lb (84.4 kg)     ASSESSMENT AND PLAN:  Atherosclerosis of native coronary artery with angina pectoris, unspecified whether native or transplanted heart [facility])  Currently with no symptoms of angina. No further workup at this time. Continue current medication regimen.  Benign essential HTN -  Blood pressure is well controlled on today's visit. No changes made to the medications.  Hyperlipidemia Cholesterol is at goal on the current lipid regimen. No changes to the medications were made.  Adjustment disorder/stress Doing well, works 3 days a week at [employer] Wife with diagnosis of Lewy body High risk of caretaker burnout  Borderline diabetes Weight stable if not trending downward, eating better A1c well-controlled 5.9 down from 6.4     Orders Placed This Encounter  Procedures   EKG 12-Lead     Signed, Velinda Lunger, M.D., Ph.D. 03/30/2024  [facility] [place], [place] [phone]

## 2024-03-30 ENCOUNTER — Encounter: Payer: Self-pay | Admitting: Cardiovascular Disease

## 2024-03-30 ENCOUNTER — Ambulatory Visit: Attending: Cardiovascular Disease | Admitting: Cardiovascular Disease

## 2024-03-30 VITALS — BP 110/62 | HR 65 | Ht 68.0 in | Wt 176.5 lb

## 2024-03-30 DIAGNOSIS — I25118 Atherosclerotic heart disease of native coronary artery with other forms of angina pectoris: Secondary | ICD-10-CM | POA: Diagnosis not present

## 2024-03-30 DIAGNOSIS — Z9861 Coronary angioplasty status: Secondary | ICD-10-CM | POA: Diagnosis present

## 2024-03-30 DIAGNOSIS — I10 Essential (primary) hypertension: Secondary | ICD-10-CM | POA: Diagnosis not present

## 2024-03-30 DIAGNOSIS — E1159 Type 2 diabetes mellitus with other circulatory complications: Secondary | ICD-10-CM | POA: Insufficient documentation

## 2024-03-30 DIAGNOSIS — E782 Mixed hyperlipidemia: Secondary | ICD-10-CM | POA: Insufficient documentation

## 2024-03-30 DIAGNOSIS — I2510 Atherosclerotic heart disease of native coronary artery without angina pectoris: Secondary | ICD-10-CM | POA: Diagnosis present

## 2024-03-30 MED ORDER — LOSARTAN POTASSIUM 50 MG PO TABS: 50.0000 mg | ORAL_TABLET | Freq: Every day | ORAL | 3 refills | Status: AC

## 2024-03-30 MED ORDER — ATORVASTATIN CALCIUM 40 MG PO TABS: 40.0000 mg | ORAL_TABLET | Freq: Every day | ORAL | 3 refills | Status: AC

## 2024-03-30 MED ORDER — CLOPIDOGREL BISULFATE 75 MG PO TABS: 75.0000 mg | ORAL_TABLET | Freq: Every day | ORAL | 3 refills | Status: AC

## 2024-03-30 MED ORDER — CARVEDILOL 3.125 MG PO TABS: 3.1250 mg | ORAL_TABLET | Freq: Two times a day (BID) | ORAL | 3 refills | Status: AC

## 2024-03-30 NOTE — Patient Instructions (Signed)

## 2024-04-01 ENCOUNTER — Ambulatory Visit: Admitting: Urology

## 2024-04-20 ENCOUNTER — Ambulatory Visit (INDEPENDENT_AMBULATORY_CARE_PROVIDER_SITE_OTHER): Admitting: Urology

## 2024-04-20 ENCOUNTER — Encounter: Payer: Self-pay | Admitting: Urology

## 2024-04-20 VITALS — BP 148/87 | HR 69 | Ht 68.0 in | Wt 171.0 lb

## 2024-04-20 DIAGNOSIS — R3915 Urgency of urination: Secondary | ICD-10-CM

## 2024-04-20 DIAGNOSIS — Z87898 Personal history of other specified conditions: Secondary | ICD-10-CM | POA: Diagnosis not present

## 2024-04-20 DIAGNOSIS — N5201 Erectile dysfunction due to arterial insufficiency: Secondary | ICD-10-CM

## 2024-04-20 DIAGNOSIS — R350 Frequency of micturition: Secondary | ICD-10-CM

## 2024-04-20 LAB — BLADDER SCAN AMB NON-IMAGING: Scan Result: 0

## 2024-04-20 MED ORDER — TADALAFIL 5 MG PO TABS: 5.0000 mg | ORAL_TABLET | Freq: Every day | ORAL | 0 refills | Status: AC

## 2024-04-20 NOTE — Progress Notes (Signed)
 04/20/2024 8:07 AM   Alec Smith 1955/11/16 [id number]  Referring provider: Gretta Comer POUR, NP [address] [place],  [place] [address]  Chief Complaint  Patient presents with   Urinary Frequency    Urologic history: 1.  Elevated PSA TRUS/biopsy prostate January 2019 for PSA of 5.5; prostate volume 34.6 cc. 12/12 cores negative for prostate cancer and 1 core showing high grade PIN   2.  Erectile dysfunction Sildenafil  prn  HPI: Alec Smith is a 68 y.o. male presents for annual follow-up.  Since last year's visit he has experienced increased urinary urgency over the last 2-3 months.  No urge incontinence.  No hesitancy, decreased stream, dysuria or gross hematuria IPSS today 10/35 He has also noted some increased difficulty achieving and maintaining an erection.  Has been taking sildenafil  100 mg and increased it to 150 mg with improvement in his symptoms. PSA levels as below.  He is scheduled to have his PSA checked by PCP at his annual physical within the next 1-2 months  PSA trend    PSA  Latest Ref Rng 0.10 - 4.00 ng/mL  02/20/2019 3.19   11/20/2019 4.13   02/22/2020 3.49   04/18/2021 3.88   05/08/2022 6.57   08/08/2022 3.89   06/04/2023 5.35    PMH: Past Medical History:  Diagnosis Date   CAD (coronary artery disease)    GERD (gastroesophageal reflux disease)    Hx of adenomatous polyp of colon 02/09/2015   Hyperlipidemia    Hypertension     Surgical History: Past Surgical History:  Procedure Laterality Date   APPENDECTOMY  1966   CORONARY ANGIOPLASTY WITH STENT PLACEMENT  2010   LUMBAR DISC SURGERY  2000   Lower back   PROSTATE BIOPSY     TONSILLECTOMY     age 20    Home Medications:  Allergies as of 04/20/2024   No Known Allergies      Medication List        Accurate as of April 20, 2024  8:07 AM. If you have any questions, ask your nurse or doctor.          Accu-Chek Guide Me w/Device Kit Test blood sugar once daily.    Accu-Chek Guide Test test strip Generic drug: glucose blood Test blood sugar once daily   Accu-Chek Softclix Lancets lancets Check blood sugar once daily   atorvastatin  40 MG tablet Commonly known as: LIPITOR Take 1 tablet (40 mg total) by mouth daily.   carvedilol  3.125 MG tablet Commonly known as: COREG  Take 1 tablet (3.125 mg total) by mouth 2 (two) times daily with a meal.   clopidogrel  75 MG tablet Commonly known as: PLAVIX  Take 1 tablet (75 mg total) by mouth daily.   fluticasone  50 MCG/ACT nasal spray Commonly known as: FLONASE  PLACE 1 SPRAY INTO BOTH NOSTRILS 2 (TWO) TIMES DAILY   KRILL OIL PO Take 300 mg by mouth daily.   levocetirizine 5 MG tablet Commonly known as: XYZAL  TAKE 1 TABLET BY MOUTH EVERY DAY IN THE EVENING FOR ALLERGIES   losartan  50 MG tablet Commonly known as: COZAAR  Take 1 tablet (50 mg total) by mouth daily.   metFORMIN  500 MG 24 hr tablet Commonly known as: GLUCOPHAGE -XR TAKE 1 TABLET BY MOUTH DAILY WITH BREAKFAST FOR DIABETES.   multivitamin with minerals Tabs tablet Take 1 tablet by mouth daily.   nitroGLYCERIN  0.4 MG SL tablet Commonly known as: NITROSTAT  Place 1 tablet (0.4 mg total) under the  tongue every 5 (five) minutes as needed for chest pain.   pantoprazole  40 MG tablet Commonly known as: PROTONIX  TAKE 1 TABLET (40 MG TOTAL) BY MOUTH DAILY. FOR HEARTBURN.   sildenafil  100 MG tablet Commonly known as: VIAGRA  TAKE 1 TABLET BY MOUTH EVERY DAY AS NEEDED FOR ERECTILE DYSFUNCTION   tadalafil  5 MG tablet Commonly known as: CIALIS  Take 1 tablet (5 mg total) by mouth daily. Started by: Glendia JAYSON Barba        Allergies: No Known Allergies  Family History: Family History  Problem Relation Age of Onset   Aneurysm Mother 34       AAA   Heart failure Father    Prostate cancer Paternal Grandfather    Vaginal cancer Paternal Grandmother        spread to lungs   Colon cancer Neg Hx    Esophageal cancer Neg Hx    Stomach  cancer Neg Hx    Rectal cancer Neg Hx     Social History:  reports that he has never smoked. He has never used smokeless tobacco. He reports current alcohol use. He reports that he does not use drugs.   Physical Exam: BP (!) 148/87   Pulse 69   Ht 5' 8 (1.727 m)   Wt 171 lb (77.6 kg)   BMI 26.00 kg/m   Constitutional:  Alert, No acute distress. HEENT: [place] AT Respiratory: Normal respiratory effort, no increased work of breathing. GU: Declined DRE Psychiatric: Normal mood and affect.  Laboratory Data:  Urinalysis Dipstick/microscopy negative   Assessment & Plan:    1.  Urinary urgency We discussed the most common causes would be BPH If symptoms are bothersome we discussed medical management.  He has had worsening ED and we discussed daily tadalafil  could be prescribed for his worsening ED and urinary symptoms.  We also discussed a trial of tamsulosin He has initially elected tadalafil  5 mg daily and Rx sent to pharmacy.  If no improvement in his urinary symptoms he will try tamsulosin PVR today 0 mL  2.  Erectile dysfunction As above He may continue on-demand sildenafil  with the daily tadalafil   Glendia JAYSON Barba, MD  [facility] [address], [address] [place], [place] [address] [phone]

## 2024-04-21 LAB — URINALYSIS, COMPLETE
Bilirubin, UA: NEGATIVE
Glucose, UA: NEGATIVE
Leukocytes,UA: NEGATIVE
Nitrite, UA: NEGATIVE
Protein,UA: NEGATIVE
RBC, UA: NEGATIVE
Specific Gravity, UA: 1.03 (ref 1.005–1.030)
Urobilinogen, Ur: 0.2 mg/dL (ref 0.2–1.0)
pH, UA: 6 (ref 5.0–7.5)

## 2024-04-21 LAB — MICROSCOPIC EXAMINATION

## 2024-05-01 ENCOUNTER — Ambulatory Visit: Payer: Self-pay | Admitting: Urology

## 2024-05-06 ENCOUNTER — Other Ambulatory Visit: Payer: Self-pay | Admitting: Primary Care

## 2024-05-06 DIAGNOSIS — E119 Type 2 diabetes mellitus without complications: Secondary | ICD-10-CM

## 2024-06-10 ENCOUNTER — Encounter: Payer: Self-pay | Admitting: Primary Care

## 2024-06-10 ENCOUNTER — Ambulatory Visit: Admitting: Primary Care

## 2024-06-10 ENCOUNTER — Other Ambulatory Visit: Payer: Self-pay | Admitting: Primary Care

## 2024-06-10 VITALS — BP 128/80 | HR 67 | Temp 97.6°F | Ht 68.75 in | Wt 178.1 lb

## 2024-06-10 DIAGNOSIS — Z7985 Long-term (current) use of injectable non-insulin antidiabetic drugs: Secondary | ICD-10-CM | POA: Diagnosis not present

## 2024-06-10 DIAGNOSIS — E782 Mixed hyperlipidemia: Secondary | ICD-10-CM

## 2024-06-10 DIAGNOSIS — E1165 Type 2 diabetes mellitus with hyperglycemia: Secondary | ICD-10-CM

## 2024-06-10 DIAGNOSIS — Z125 Encounter for screening for malignant neoplasm of prostate: Secondary | ICD-10-CM | POA: Diagnosis not present

## 2024-06-10 DIAGNOSIS — E349 Endocrine disorder, unspecified: Secondary | ICD-10-CM | POA: Diagnosis not present

## 2024-06-10 DIAGNOSIS — I2510 Atherosclerotic heart disease of native coronary artery without angina pectoris: Secondary | ICD-10-CM

## 2024-06-10 DIAGNOSIS — N400 Enlarged prostate without lower urinary tract symptoms: Secondary | ICD-10-CM | POA: Diagnosis not present

## 2024-06-10 DIAGNOSIS — Z9861 Coronary angioplasty status: Secondary | ICD-10-CM

## 2024-06-10 DIAGNOSIS — I10 Essential (primary) hypertension: Secondary | ICD-10-CM

## 2024-06-10 DIAGNOSIS — K219 Gastro-esophageal reflux disease without esophagitis: Secondary | ICD-10-CM

## 2024-06-10 LAB — CBC
HCT: 43.9 % (ref 39.0–52.0)
Hemoglobin: 14.8 g/dL (ref 13.0–17.0)
MCHC: 33.7 g/dL (ref 30.0–36.0)
MCV: 87.4 fl (ref 78.0–100.0)
Platelets: 165 10*3/uL (ref 150.0–400.0)
RBC: 5.02 Mil/uL (ref 4.22–5.81)
RDW: 14.3 % (ref 11.5–15.5)
WBC: 5.6 10*3/uL (ref 4.0–10.5)

## 2024-06-10 LAB — COMPREHENSIVE METABOLIC PANEL WITH GFR
ALT: 23 U/L (ref 3–53)
AST: 19 U/L (ref 5–37)
Albumin: 4.4 g/dL (ref 3.5–5.2)
Alkaline Phosphatase: 70 U/L (ref 39–117)
BUN: 27 mg/dL — ABNORMAL HIGH (ref 6–23)
CO2: 31 meq/L (ref 19–32)
Calcium: 9.6 mg/dL (ref 8.4–10.5)
Chloride: 104 meq/L (ref 96–112)
Creatinine, Ser: 1.16 mg/dL (ref 0.40–1.50)
GFR: 64.32 mL/min
Glucose, Bld: 99 mg/dL (ref 70–99)
Potassium: 4.1 meq/L (ref 3.5–5.1)
Sodium: 140 meq/L (ref 135–145)
Total Bilirubin: 0.7 mg/dL (ref 0.2–1.2)
Total Protein: 6.8 g/dL (ref 6.0–8.3)

## 2024-06-10 LAB — LIPID PANEL
Cholesterol: 119 mg/dL (ref 28–200)
HDL: 38.6 mg/dL — ABNORMAL LOW
LDL Cholesterol: 60 mg/dL (ref 10–99)
NonHDL: 80.56
Total CHOL/HDL Ratio: 3
Triglycerides: 101 mg/dL (ref 10.0–149.0)
VLDL: 20.2 mg/dL (ref 0.0–40.0)

## 2024-06-10 LAB — PSA, MEDICARE: PSA: 5.2 ng/mL — ABNORMAL HIGH (ref 0.10–4.00)

## 2024-06-10 LAB — HEMOGLOBIN A1C: Hgb A1c MFr Bld: 6.2 % (ref 4.6–6.5)

## 2024-06-10 MED ORDER — BLOOD GLUCOSE MONITORING SUPPL DEVI: 1.0000 | Freq: Three times a day (TID) | 0 refills | Status: DC

## 2024-06-10 MED ORDER — LANCET DEVICE MISC: 1.0000 | Freq: Three times a day (TID) | 0 refills | Status: AC

## 2024-06-10 MED ORDER — LANCETS MISC: 1.0000 | 0 refills | Status: DC

## 2024-06-10 MED ORDER — BLOOD GLUCOSE TEST VI STRP: ORAL_STRIP | 2 refills | Status: AC

## 2024-06-10 NOTE — Assessment & Plan Note (Signed)
 Repeat lipid panel pending.  Continue work on a healthy diet and regular exercise in order for weight loss, and to reduce the risk of further co-morbidity. Continue atorvastatin  40 mg daily.

## 2024-06-10 NOTE — Assessment & Plan Note (Signed)
 Controlled.  Continue losartan  50 mg daily, carvedilol  3.215 mg BID. CMP pending.

## 2024-06-10 NOTE — Patient Instructions (Signed)
 Stop by the lab prior to leaving today. I will notify you of your results once received.   Please schedule a follow up visit for 6 months for a diabetes check.  It was a pleasure to see you today!

## 2024-06-10 NOTE — Assessment & Plan Note (Signed)
 Following with urology, office notes reviewed from November 2025. Continue tadalafil  5 mg daily and sildenafil ..  Testosterone  level and PSA pending.

## 2024-06-10 NOTE — Assessment & Plan Note (Signed)
 Prior history, once management injections. Repeat testosterone  level pending per patient request

## 2024-06-10 NOTE — Assessment & Plan Note (Signed)
 Asymptomatic.   Following with cardiology, office notes reviewed from November 2025.  Continue Plavix  75 mg daily, atorvastatin  40 mg daily.  Continue BP, diabetes, and lipid control.

## 2024-06-10 NOTE — Assessment & Plan Note (Signed)
 Controlled.  Continue pantoprazole  40 mg daily,

## 2024-06-10 NOTE — Assessment & Plan Note (Signed)
 Repeat A1C pending.  Continue to work on a healthy diet and regular exercise in order for weight loss, and to reduce the risk of further co-morbidity. Continue metformin  XR 500 mg daily.  Foot exam today.  Follow up in 6 months.

## 2024-06-10 NOTE — Progress Notes (Signed)
 "  Subjective:    Patient ID: Alec Smith, male    DOB: 05/30/1955, 69 y.o.   MRN: [id number]  Alec Smith is a very pleasant 69 y.o. male with a history of hypertension, PAD, type 2 diabetes, hyperlipidemia who presents today for follow-up of chronic conditions.    Immunizations: -Tetanus: Completed in 2019 -Influenza: Completed this season -Shingles: Completed Shingrix series -Pneumonia: Completed last in 2022  Colonoscopy: Completed in 2016, due in 2021 and has yet to complete.  Completed Cologuard in 2025, negative.  PSA: Due     1) Hypertension/CAD/Hyperlipidemia: Currently managed on atorvastatin  40 mg daily, clopidogrel  75 mg daily, losartan  50 mg daily, carvedilol  3.125 mg twice daily.  Follow-up cardiology, last office visit was in November 2025.  No changes were made to his regimen.  2) Elevated PSA: Following with urology, last office visit was in December 2025.  During this visit his PSA level was presumed to be secondary to BPH.  He is managed on tadalafil  5 mg daily for treatment and as needed sildenafil .  He would like his testosterone  checked today.  Previously managed on injections, felt no improvement.  3) Type 2 Diabetes:  Current medications include: Metformin  XR 500 mg daily.  He is checking his blood glucose 0 times daily.  His current glucometer does not work effectively  Last A1C: 5.9 in June 2025 Last Eye Exam: Up-to-date Last Foot Exam: Due Pneumonia Vaccination: 2022 Urine Microalbumin: Up-to-date Statin: Atorvastatin   BP Readings from Last 3 Encounters:  06/10/24 128/80  04/20/24 (!) 148/87  03/30/24 110/62   Wt Readings from Last 3 Encounters:  06/10/24 178 lb 2 oz (80.8 kg)  04/20/24 171 lb (77.6 kg)  03/30/24 176 lb 8 oz (80.1 kg)      Review of Systems  Respiratory:  Negative for shortness of breath.   Cardiovascular:  Negative for chest pain.  Gastrointestinal:  Negative for constipation and diarrhea.  Neurological:   Negative for dizziness and headaches.  Psychiatric/Behavioral:  The patient is not nervous/anxious.          Past Medical History:  Diagnosis Date   CAD (coronary artery disease)    GERD (gastroesophageal reflux disease)    Hx of adenomatous polyp of colon 02/09/2015   Hyperlipidemia    Hypertension     Social History   Socioeconomic History   Marital status: Married    Spouse name: Not on file   Number of children: 2   Years of education: Not on file   Highest education level: Master's degree (e.g., MA, MS, MEng, MEd, MSW, MBA)  Occupational History   Occupation: school Sales Promotion Account Executive: [employer]  Tobacco Use   Smoking status: Never   Smokeless tobacco: Never  Vaping Use   Vaping status: Never Used  Substance and Sexual Activity   Alcohol use: Yes    Alcohol/week: 0.0 standard drinks of alcohol    Comment: once or twice a year   Drug use: No   Sexual activity: Not on file  Other Topics Concern   Not on file  Social History Narrative   Married.   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   01/21/2015   Social Drivers of Health   Tobacco Use: Low Risk (06/10/2024)   Patient History    Smoking Tobacco Use: Never    Smokeless Tobacco Use: Never    Passive Exposure:  Not on file  Financial Resource Strain: Medium Risk (01/17/2024)   Overall Financial Resource Strain (CARDIA)    Difficulty of Paying Living Expenses: Somewhat hard  Food Insecurity: No Food Insecurity (01/17/2024)   Epic    Worried About Programme Researcher, Broadcasting/film/video in the Last Year: Never true    Ran Out of Food in the Last Year: Never true  Transportation Needs: No Transportation Needs (01/17/2024)   Epic    Lack of Transportation (Medical): No    Lack of Transportation (Non-Medical): No  Physical Activity: Inactive (01/17/2024)   Exercise Vital Sign    Days of Exercise per Week: 0 days    Minutes of Exercise per Session: 0 min   Stress: No Stress Concern Present (01/17/2024)   [employer] of Occupational Health - Occupational Stress Questionnaire    Feeling of Stress: Only a little  Social Connections: Socially Isolated (01/17/2024)   Social Connection and Isolation Panel    Frequency of Communication with Friends and Family: Never    Frequency of Social Gatherings with Friends and Family: Never    Attends Religious Services: Never    Database Administrator or Organizations: No    Attends Banker Meetings: Never    Marital Status: Married  Catering Manager Violence: Not At Risk (01/17/2024)   Epic    Fear of Current or Ex-Partner: No    Emotionally Abused: No    Physically Abused: No    Sexually Abused: No  Depression (PHQ2-9): Low Risk (06/10/2024)   Depression (PHQ2-9)    PHQ-2 Score: 0  Alcohol Screen: Low Risk (01/17/2024)   Alcohol Screen    Last Alcohol Screening Score (AUDIT): 0  Housing: Low Risk (01/17/2024)   Epic    Unable to Pay for Housing in the Last Year: No    Number of Times Moved in the Last Year: 0    Homeless in the Last Year: No  Utilities: Not At Risk (01/17/2024)   Epic    Threatened with loss of utilities: No  Health Literacy: Adequate Health Literacy (01/17/2024)   B1300 Health Literacy    Frequency of need for help with medical instructions: Never    Past Surgical History:  Procedure Laterality Date   APPENDECTOMY  1966   CORONARY ANGIOPLASTY WITH STENT PLACEMENT  2010   LUMBAR DISC SURGERY  2000   Lower back   PROSTATE BIOPSY     TONSILLECTOMY     age 86    Family History  Problem Relation Age of Onset   Aneurysm Mother 57       AAA   Heart failure Father    Prostate cancer Paternal Grandfather    Vaginal cancer Paternal Grandmother        spread to lungs   Colon cancer Neg Hx    Esophageal cancer Neg Hx    Stomach cancer Neg Hx    Rectal cancer Neg Hx     Allergies[1]  Medications Ordered Prior to Encounter[2]  BP 128/80   Pulse 67   Temp  97.6 F (36.4 C) (Oral)   Ht 5' 8.75 (1.746 m)   Wt 178 lb 2 oz (80.8 kg)   SpO2 96%   BMI 26.50 kg/m  Objective:   Physical Exam Cardiovascular:     Rate and Rhythm: Normal rate and regular rhythm.  Pulmonary:     Effort: Pulmonary effort is normal.     Breath sounds: Normal breath sounds.  Musculoskeletal:  Cervical back: Neck supple.  Skin:    General: Skin is warm and dry.  Neurological:     Mental Status: He is alert and oriented to person, place, and time.  Psychiatric:        Mood and Affect: Mood normal.     Physical Exam        Assessment & Plan:  Type 2 diabetes mellitus with hyperglycemia, without long-term current use of insulin (HCC) Assessment & Plan: Repeat A1C pending.  Continue to work on a healthy diet and regular exercise in order for weight loss, and to reduce the risk of further co-morbidity. Continue metformin  XR 500 mg daily.  Foot exam today.  Follow up in 6 months.   Orders: -     Hemoglobin A1c -     Blood Glucose Monitoring Suppl; 1 each by Does not apply route in the morning, at noon, and at bedtime. May substitute to any manufacturer covered by patient's insurance.  Dispense: 1 each; Refill: 0 -     Blood Glucose Test; Check blood sugar once daily. May substitute to any manufacturer covered by patient's insurance.  Dispense: 100 strip; Refill: 2 -     Lancet Device; 1 each by Does not apply route in the morning, at noon, and at bedtime. May substitute to any manufacturer covered by patient's insurance.  Dispense: 1 each; Refill: 0 -     Lancets; 1 each by Does not apply route as directed. Dispense based on patient and insurance preference. Use up to four times daily as directed. (FOR ICD-10 E10.9, E11.9).  Dispense: 100 each; Refill: 0  CAD S/P percutaneous coronary angioplasty/stents Assessment & Plan: Asymptomatic.   Following with cardiology, office notes reviewed from November 2025.  Continue Plavix  75 mg daily, atorvastatin   40 mg daily.  Continue BP, diabetes, and lipid control.   Orders: -     Lipid panel  Benign essential HTN Assessment & Plan: Controlled.  Continue losartan  50 mg daily, carvedilol  3.215 mg BID. CMP pending.  Orders: -     Comprehensive metabolic panel with GFR  Gastroesophageal reflux disease, unspecified whether esophagitis present Assessment & Plan: Controlled.  Continue pantoprazole  40 mg daily,   Mixed hyperlipidemia Assessment & Plan: Repeat lipid panel pending.  Continue work on a healthy diet and regular exercise in order for weight loss, and to reduce the risk of further co-morbidity. Continue atorvastatin  40 mg daily.   Orders: -     Lipid panel -     CBC  Testosterone  deficiency Assessment & Plan: Prior history, once management injections. Repeat testosterone  level pending per patient request  Orders: -     Testosterone ,Free and Total  Screening for prostate cancer -     PSA, Medicare  Benign prostatic hyperplasia without lower urinary tract symptoms Assessment & Plan: Following with urology, office notes reviewed from November 2025. Continue tadalafil  5 mg daily and sildenafil ..  Testosterone  level and PSA pending.     Assessment and Plan Assessment & Plan         Comer MARLA Gaskins, NP       [1] No Known Allergies [2]  Current Outpatient Medications on File Prior to Visit  Medication Sig Dispense Refill   atorvastatin  (LIPITOR) 40 MG tablet Take 1 tablet (40 mg total) by mouth daily. 90 tablet 3   carvedilol  (COREG ) 3.125 MG tablet Take 1 tablet (3.125 mg total) by mouth 2 (two) times daily with a meal. 180 tablet 3   clopidogrel  (PLAVIX )  75 MG tablet Take 1 tablet (75 mg total) by mouth daily. 90 tablet 3   fluticasone  (FLONASE ) 50 MCG/ACT nasal spray PLACE 1 SPRAY INTO BOTH NOSTRILS 2 (TWO) TIMES DAILY 48 mL 0   glucose blood (ACCU-CHEK GUIDE TEST) test strip Test blood sugar once daily 100 strip 5   KRILL OIL PO Take 300 mg  by mouth daily.     levocetirizine (XYZAL ) 5 MG tablet TAKE 1 TABLET BY MOUTH EVERY DAY IN THE EVENING FOR ALLERGIES 90 tablet 2   losartan  (COZAAR ) 50 MG tablet Take 1 tablet (50 mg total) by mouth daily. 90 tablet 3   metFORMIN  (GLUCOPHAGE -XR) 500 MG 24 hr tablet TAKE 1 TABLET BY MOUTH DAILY WITH BREAKFAST FOR DIABETES. 90 tablet 0   Multiple Vitamin (MULTIVITAMIN WITH MINERALS) TABS tablet Take 1 tablet by mouth daily.     nitroGLYCERIN  (NITROSTAT ) 0.4 MG SL tablet Place 1 tablet (0.4 mg total) under the tongue every 5 (five) minutes as needed for chest pain. 25 tablet 3   pantoprazole  (PROTONIX ) 40 MG tablet TAKE 1 TABLET (40 MG TOTAL) BY MOUTH DAILY. FOR HEARTBURN. 90 tablet 2   sildenafil  (VIAGRA ) 100 MG tablet TAKE 1 TABLET BY MOUTH EVERY DAY AS NEEDED FOR ERECTILE DYSFUNCTION 90 tablet 1   tadalafil  (CIALIS ) 5 MG tablet Take 1 tablet (5 mg total) by mouth daily. 30 tablet 0   No current facility-administered medications on file prior to visit.   "

## 2024-06-11 ENCOUNTER — Ambulatory Visit: Payer: Self-pay | Admitting: Primary Care

## 2024-06-11 DIAGNOSIS — E349 Endocrine disorder, unspecified: Secondary | ICD-10-CM

## 2024-06-13 LAB — TESTOSTERONE,FREE AND TOTAL
Testosterone, Free: 1.8 pg/mL — ABNORMAL LOW (ref 6.6–18.1)
Testosterone: 283 ng/dL (ref 264–916)

## 2024-06-14 ENCOUNTER — Encounter: Payer: Self-pay | Admitting: Urology

## 2024-06-24 ENCOUNTER — Other Ambulatory Visit

## 2024-12-09 ENCOUNTER — Ambulatory Visit: Admitting: Primary Care

## 2025-01-20 ENCOUNTER — Ambulatory Visit
